# Patient Record
Sex: Female | Born: 1985 | Race: Black or African American | Hispanic: No | Marital: Married | Smoking: Never smoker
Health system: Southern US, Community
[De-identification: ages and names within clinical notes are randomized; demographics above are authoritative.]

## PROBLEM LIST (undated history)

## (undated) DIAGNOSIS — R87629 Unspecified abnormal cytological findings in specimens from vagina: Secondary | ICD-10-CM

## (undated) DIAGNOSIS — N809 Endometriosis, unspecified: Secondary | ICD-10-CM

## (undated) DIAGNOSIS — K219 Gastro-esophageal reflux disease without esophagitis: Secondary | ICD-10-CM

## (undated) DIAGNOSIS — O021 Missed abortion: Secondary | ICD-10-CM

## (undated) DIAGNOSIS — Z973 Presence of spectacles and contact lenses: Secondary | ICD-10-CM

## (undated) DIAGNOSIS — F419 Anxiety disorder, unspecified: Secondary | ICD-10-CM

## (undated) HISTORY — PX: WISDOM TOOTH EXTRACTION: SHX21

## (undated) HISTORY — PX: DILATION AND CURETTAGE OF UTERUS: SHX78

---

## 2005-07-21 ENCOUNTER — Emergency Department: Payer: Self-pay | Admitting: Emergency Medicine

## 2005-12-30 ENCOUNTER — Ambulatory Visit: Payer: Self-pay | Admitting: Internal Medicine

## 2006-02-17 ENCOUNTER — Ambulatory Visit: Payer: Self-pay | Admitting: Unknown Physician Specialty

## 2010-05-17 ENCOUNTER — Emergency Department: Payer: Self-pay | Admitting: Unknown Physician Specialty

## 2010-05-20 ENCOUNTER — Emergency Department: Payer: Self-pay | Admitting: Emergency Medicine

## 2013-07-20 HISTORY — PX: LEEP: SHX91

## 2015-06-03 ENCOUNTER — Other Ambulatory Visit: Payer: Self-pay | Admitting: Obstetrics and Gynecology

## 2015-06-04 ENCOUNTER — Encounter (HOSPITAL_COMMUNITY): Payer: Self-pay

## 2015-06-25 ENCOUNTER — Encounter (HOSPITAL_COMMUNITY): Payer: Self-pay | Admitting: Obstetrics and Gynecology

## 2015-06-25 ENCOUNTER — Other Ambulatory Visit (HOSPITAL_COMMUNITY): Payer: Self-pay | Admitting: Obstetrics and Gynecology

## 2015-06-25 NOTE — H&P (Signed)
Kathryn Perry is a 29 y.o. female G0 presents for  laparoscopic removal of an ovarian cyst and evaluation of pelvic pain because of a complex right ovarian cyst. and pelvic pain.  In June 2016 the patient stopped taking oral contraceptives in a effort to conceive and began to have heavy menstrual periods accompanied by severe cramping.  Her 5 day  menstrual flow required her to change a super tampon with a pad every 2-3 hours.  She rated her cramping as  8-10 on a 10 point pain scale with relief to 7/10 when she takes Tylenol  (unable to take NSAIDs due to allergy).  She goes on to report rectal  bleeding and pain with bowel movements,  lower back pain 3 out of 4 weeks each month,  but denies inter-menstrual bleeding, dyspareunia or change in urinary function.  A pelvic ultrasound in November 2016 showed:  uterus-5.33 x 3.42 x 3.61 cm;  right ovary-3.86 x 2.26 x 2.43 cm containing a septated cyst measuring-2.2 x 1.6 x 1.9 cm and a left ovary-2.10 x 1.00 x 1.61 cm.  Given the patient's presentation,  endometriosis was considered as a  possible cause of her symptoms.   Both medical and surgical options were reviewed with the patient regarding management and evaluation of her symptoms, however since pregnancy is desired the patient wishes to proceed with surgical evaluation and management of her pelvic pain and ovarian cyst.   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  Has a history of abnormal PAP smear that was treated in 2015 with LEEP;   Last PAP smear: 04/2015-normal  Medical History:  Anxiety, Migraine and Dysrhythmia (as a child)  Surgical History: 2015 Loop Electrosurgical Excision Procedure Denies problems with anesthesia or history of blood transfusions  Family History: Thyroid Disease and Diverticulosis   Social History:  Married and employed as a Pharmacist, hospital with [employer];   Denies tobacco use and occasionally uses alcohol   Medication:  Tylenol 500 mg as needed Allegra as needed Folic Acid daily  Allergies  Allergen Reactions  . Asa [Aspirin] Hives  . Nsaids Hives  Percocet causes severe nausea and vomiting  (unsure of Vicodin) Able to take Tylenol #3  (very small dose without nausea and vomiting)  Denies sensitivity to peanuts, shellfish, soy, latex or adhesives.  ROS: Admits to glasses, rectal bleeding and  menstrual migraines but  denies  vision changes, nasal congestion, dysphagia, tinnitus, dizziness, hoarseness, cough,  chest pain, shortness of breath, nausea, vomiting, diarrhea,constipation,  urinary frequency, urgency  dysuria, hematuria, vaginitis symptoms,  swelling of joints,easy bruising,  myalgias, arthralgias, skin rashes, unexplained weight loss and except as is mentioned in the history of present illness, patient's review of systems is otherwise negative.   Physical Exam  Bp: 112/72  P: 80  R: 20  Temperature: 98.5 degrees F orally   Weight: 146 lbs.  Height: 5' 1.5"  BMI: 27.1  Neck: supple without masses or thyromegaly Lungs: clear to auscultation Heart: regular rate and rhythm Abdomen: soft, diffusely tender, right  with guarding but not on the  left and no organomegaly Pelvic:EGBUS- wnl; vagina-normal rugae; uterus-normal size but tender,  cervix without lesions or motion tenderness; adnexae- right sided tenderness but no palpable masses masses Extremities:  no clubbing, cyanosis or edema   Assesment:  Complex Ovarian Cyst  Pelvic Pain             Menorrhagia                          Disposition:  Reviewed with the patient, the indication for her procedures along with tthe risks of surgery to include, but not limited to: reaction to anesthesia, damage to adjacent organs, infection and excessive bleeding. The patient verbalized understanding of these risks and has consented to proceed with Laparoscopic Ovarian  Cystectomy, Chromopertubation and Possible Removal of Endometriosis at [facility] on June 26, 2015 at 9:50 a.m.  CSN# [id number]   Tenea Sens J. Florene Glen, PA-C  for Dr. Seymour Bars. Haygood

## 2015-06-26 ENCOUNTER — Ambulatory Visit (HOSPITAL_COMMUNITY): Payer: BLUE CROSS/BLUE SHIELD | Admitting: Certified Registered Nurse Anesthetist

## 2015-06-26 ENCOUNTER — Encounter (HOSPITAL_COMMUNITY)
Admission: RE | Disposition: A | Discharge status: Home / Self Care | Payer: Self-pay | Source: Ambulatory Visit | Attending: Obstetrics and Gynecology

## 2015-06-26 ENCOUNTER — Ambulatory Visit (HOSPITAL_COMMUNITY)
Admission: RE | Admit: 2015-06-26 | Discharge: 2015-06-26 | Disposition: A | Discharge status: Home / Self Care | Payer: BLUE CROSS/BLUE SHIELD | Source: Ambulatory Visit | Attending: Obstetrics and Gynecology | Admitting: Obstetrics and Gynecology

## 2015-06-26 ENCOUNTER — Encounter (HOSPITAL_COMMUNITY): Payer: Self-pay | Admitting: Certified Registered Nurse Anesthetist

## 2015-06-26 DIAGNOSIS — N8311 Corpus luteum cyst of right ovary: Secondary | ICD-10-CM | POA: Diagnosis not present

## 2015-06-26 DIAGNOSIS — N946 Dysmenorrhea, unspecified: Secondary | ICD-10-CM | POA: Diagnosis not present

## 2015-06-26 DIAGNOSIS — N809 Endometriosis, unspecified: Secondary | ICD-10-CM | POA: Diagnosis present

## 2015-06-26 DIAGNOSIS — R198 Other specified symptoms and signs involving the digestive system and abdomen: Secondary | ICD-10-CM | POA: Diagnosis present

## 2015-06-26 DIAGNOSIS — N83209 Unspecified ovarian cyst, unspecified side: Secondary | ICD-10-CM | POA: Diagnosis not present

## 2015-06-26 DIAGNOSIS — R102 Pelvic and perineal pain: Secondary | ICD-10-CM | POA: Diagnosis not present

## 2015-06-26 DIAGNOSIS — N803 Endometriosis of pelvic peritoneum: Secondary | ICD-10-CM | POA: Diagnosis not present

## 2015-06-26 DIAGNOSIS — K625 Hemorrhage of anus and rectum: Secondary | ICD-10-CM | POA: Diagnosis present

## 2015-06-26 DIAGNOSIS — N920 Excessive and frequent menstruation with regular cycle: Secondary | ICD-10-CM | POA: Diagnosis not present

## 2015-06-26 DIAGNOSIS — N83201 Unspecified ovarian cyst, right side: Secondary | ICD-10-CM | POA: Diagnosis present

## 2015-06-26 HISTORY — PX: LAPAROSCOPY: SHX197

## 2015-06-26 HISTORY — PX: CHROMOPERTUBATION: SHX6288

## 2015-06-26 HISTORY — DX: Anxiety disorder, unspecified: F41.9

## 2015-06-26 HISTORY — PX: LAPAROSCOPIC OVARIAN CYSTECTOMY: SHX6248

## 2015-06-26 LAB — CBC
HCT: 35.3 % — ABNORMAL LOW (ref 36.0–46.0)
Hemoglobin: 11.9 g/dL — ABNORMAL LOW (ref 12.0–15.0)
MCH: 28.9 pg (ref 26.0–34.0)
MCHC: 33.7 g/dL (ref 30.0–36.0)
MCV: 85.7 fL (ref 78.0–100.0)
PLATELETS: 226 10*3/uL (ref 150–400)
RBC: 4.12 MIL/uL (ref 3.87–5.11)
RDW: 13.4 % (ref 11.5–15.5)
WBC: 7.1 10*3/uL (ref 4.0–10.5)

## 2015-06-26 LAB — PREGNANCY, URINE: Preg Test, Ur: NEGATIVE

## 2015-06-26 SURGERY — EXCISION, CYST, OVARY, LAPAROSCOPIC
Anesthesia: General | Site: Abdomen

## 2015-06-26 MED ORDER — ONDANSETRON HCL 4 MG/2ML IJ SOLN
INTRAMUSCULAR | Status: DC | PRN
  Administered 2015-06-26: 4 mg via INTRAVENOUS

## 2015-06-26 MED ORDER — FENTANYL CITRATE (PF) 250 MCG/5ML IJ SOLN
INTRAMUSCULAR | Status: AC
  Filled 2015-06-26: qty 5

## 2015-06-26 MED ORDER — HYDROCODONE-ACETAMINOPHEN 5-325 MG PO TABS
1.0000 | ORAL_TABLET | Freq: Once | ORAL | Status: AC
  Administered 2015-06-26: 1 via ORAL

## 2015-06-26 MED ORDER — ROCURONIUM BROMIDE 100 MG/10ML IV SOLN
INTRAVENOUS | Status: DC | PRN
  Administered 2015-06-26 (×2): 10 mg via INTRAVENOUS
  Administered 2015-06-26: 40 mg via INTRAVENOUS

## 2015-06-26 MED ORDER — PROPOFOL 10 MG/ML IV BOLUS
INTRAVENOUS | Status: DC | PRN
  Administered 2015-06-26: 200 mg via INTRAVENOUS

## 2015-06-26 MED ORDER — LIDOCAINE HCL (CARDIAC) 20 MG/ML IV SOLN
INTRAVENOUS | Status: DC | PRN
  Administered 2015-06-26: 50 mg via INTRAVENOUS

## 2015-06-26 MED ORDER — DEXAMETHASONE SODIUM PHOSPHATE 4 MG/ML IJ SOLN
INTRAMUSCULAR | Status: AC
  Filled 2015-06-26: qty 1

## 2015-06-26 MED ORDER — SCOPOLAMINE 1 MG/3DAYS TD PT72
1.0000 | MEDICATED_PATCH | Freq: Once | TRANSDERMAL | Status: DC
  Administered 2015-06-26: 1.5 mg via TRANSDERMAL

## 2015-06-26 MED ORDER — HYDROCODONE-ACETAMINOPHEN 5-325 MG PO TABS: 1.0000 | ORAL_TABLET | ORAL | Status: DC | PRN

## 2015-06-26 MED ORDER — ACETAMINOPHEN 10 MG/ML IV SOLN
1000.0000 mg | Freq: Once | INTRAVENOUS | Status: AC
  Administered 2015-06-26: 1000 mg via INTRAVENOUS
  Filled 2015-06-26: qty 100

## 2015-06-26 MED ORDER — HEPARIN SODIUM (PORCINE) 5000 UNIT/ML IJ SOLN
INTRAMUSCULAR | Status: AC
  Filled 2015-06-26: qty 1

## 2015-06-26 MED ORDER — METHYLENE BLUE 1 % INJ SOLN
INTRAMUSCULAR | Status: AC
  Filled 2015-06-26: qty 1

## 2015-06-26 MED ORDER — HYDROMORPHONE HCL 1 MG/ML IJ SOLN
INTRAMUSCULAR | Status: AC
  Filled 2015-06-26: qty 1

## 2015-06-26 MED ORDER — MIDAZOLAM HCL 2 MG/2ML IJ SOLN
INTRAMUSCULAR | Status: AC
  Filled 2015-06-26: qty 2

## 2015-06-26 MED ORDER — DEXAMETHASONE SODIUM PHOSPHATE 10 MG/ML IJ SOLN
INTRAMUSCULAR | Status: DC | PRN
  Administered 2015-06-26: 4 mg via INTRAVENOUS

## 2015-06-26 MED ORDER — METOCLOPRAMIDE HCL 5 MG/ML IJ SOLN
10.0000 mg | Freq: Once | INTRAMUSCULAR | Status: AC
  Administered 2015-06-26: 10 mg via INTRAVENOUS

## 2015-06-26 MED ORDER — ONDANSETRON HCL 4 MG/2ML IJ SOLN
INTRAMUSCULAR | Status: AC
  Filled 2015-06-26: qty 2

## 2015-06-26 MED ORDER — ACETAMINOPHEN 500 MG PO TABS: ORAL_TABLET | ORAL | Status: DC

## 2015-06-26 MED ORDER — PROPOFOL 10 MG/ML IV BOLUS
INTRAVENOUS | Status: AC
  Filled 2015-06-26: qty 20

## 2015-06-26 MED ORDER — GLYCOPYRROLATE 0.2 MG/ML IJ SOLN
INTRAMUSCULAR | Status: AC
Start: 2015-06-26 — End: 2015-06-26
  Filled 2015-06-26: qty 3

## 2015-06-26 MED ORDER — SCOPOLAMINE 1 MG/3DAYS TD PT72
MEDICATED_PATCH | TRANSDERMAL | Status: AC
  Administered 2015-06-26: 1.5 mg via TRANSDERMAL
  Filled 2015-06-26: qty 1

## 2015-06-26 MED ORDER — BUPIVACAINE HCL (PF) 0.25 % IJ SOLN
INTRAMUSCULAR | Status: DC | PRN
  Administered 2015-06-26: 30 mL

## 2015-06-26 MED ORDER — NEOSTIGMINE METHYLSULFATE 10 MG/10ML IV SOLN
INTRAVENOUS | Status: DC | PRN
  Administered 2015-06-26: 4 mg via INTRAVENOUS

## 2015-06-26 MED ORDER — BUPIVACAINE HCL (PF) 0.25 % IJ SOLN
INTRAMUSCULAR | Status: AC
  Filled 2015-06-26: qty 60

## 2015-06-26 MED ORDER — HYDROMORPHONE HCL 1 MG/ML IJ SOLN
INTRAMUSCULAR | Status: DC | PRN
  Administered 2015-06-26 (×2): 0.5 mg via INTRAVENOUS

## 2015-06-26 MED ORDER — ONDANSETRON HCL 4 MG PO TABS: 4.0000 mg | ORAL_TABLET | Freq: Three times a day (TID) | ORAL | Status: DC | PRN

## 2015-06-26 MED ORDER — HYDROMORPHONE HCL 1 MG/ML IJ SOLN
INTRAMUSCULAR | Status: AC
  Administered 2015-06-26: 0.5 mg via INTRAVENOUS
  Filled 2015-06-26: qty 1

## 2015-06-26 MED ORDER — LACTATED RINGERS IV SOLN
INTRAVENOUS | Status: DC
  Administered 2015-06-26 (×2): via INTRAVENOUS

## 2015-06-26 MED ORDER — MIDAZOLAM HCL 2 MG/2ML IJ SOLN
INTRAMUSCULAR | Status: DC | PRN
  Administered 2015-06-26: 2 mg via INTRAVENOUS

## 2015-06-26 MED ORDER — LIDOCAINE HCL (PF) 1 % IJ SOLN
INTRAMUSCULAR | Status: AC
  Filled 2015-06-26: qty 5

## 2015-06-26 MED ORDER — KETOROLAC TROMETHAMINE 30 MG/ML IJ SOLN
INTRAMUSCULAR | Status: AC
  Filled 2015-06-26: qty 1

## 2015-06-26 MED ORDER — LACTATED RINGERS IV SOLN
INTRAVENOUS | Status: DC | PRN
  Administered 2015-06-26: 1000 mL

## 2015-06-26 MED ORDER — HYDROMORPHONE HCL 1 MG/ML IJ SOLN
0.2500 mg | INTRAMUSCULAR | Status: DC | PRN
  Administered 2015-06-26 (×2): 0.5 mg via INTRAVENOUS

## 2015-06-26 MED ORDER — MEPERIDINE HCL 25 MG/ML IJ SOLN: 6.2500 mg | INTRAMUSCULAR | Status: DC | PRN

## 2015-06-26 MED ORDER — METOCLOPRAMIDE HCL 5 MG/ML IJ SOLN
INTRAMUSCULAR | Status: AC
  Administered 2015-06-26: 10 mg via INTRAVENOUS
  Filled 2015-06-26: qty 2

## 2015-06-26 MED ORDER — FENTANYL CITRATE (PF) 100 MCG/2ML IJ SOLN
INTRAMUSCULAR | Status: DC | PRN
  Administered 2015-06-26 (×2): 50 ug via INTRAVENOUS
  Administered 2015-06-26: 100 ug via INTRAVENOUS
  Administered 2015-06-26: 50 ug via INTRAVENOUS

## 2015-06-26 MED ORDER — LACTATED RINGERS IV SOLN: INTRAVENOUS | Status: DC

## 2015-06-26 MED ORDER — HYDROCODONE-ACETAMINOPHEN 5-325 MG PO TABS
ORAL_TABLET | ORAL | Status: AC
  Administered 2015-06-26: 1 via ORAL
  Filled 2015-06-26: qty 1

## 2015-06-26 MED ORDER — METHYLENE BLUE 1 % INJ SOLN
INTRAMUSCULAR | Status: DC | PRN
  Administered 2015-06-26: 90 mL via SUBMUCOSAL

## 2015-06-26 MED ORDER — GLYCOPYRROLATE 0.2 MG/ML IJ SOLN
INTRAMUSCULAR | Status: DC | PRN
  Administered 2015-06-26: 0.6 mg via INTRAVENOUS

## 2015-06-26 MED ORDER — PROMETHAZINE HCL 25 MG/ML IJ SOLN: 6.2500 mg | INTRAMUSCULAR | Status: DC | PRN

## 2015-06-26 SURGICAL SUPPLY — 38 items
CABLE HIGH FREQUENCY MONO STRZ (ELECTRODE) IMPLANT
CHLORAPREP W/TINT 26ML (MISCELLANEOUS) ×4 IMPLANT
CLOSURE WOUND 1/4 X3 (GAUZE/BANDAGES/DRESSINGS)
CLOTH BEACON ORANGE TIMEOUT ST (SAFETY) ×4 IMPLANT
CONTAINER PREFILL 10% NBF 60ML (FORM) IMPLANT
DILATOR CANAL MILEX (MISCELLANEOUS) ×4 IMPLANT
DRSG COVADERM PLUS 2X2 (GAUZE/BANDAGES/DRESSINGS) ×8 IMPLANT
DRSG OPSITE POSTOP 3X4 (GAUZE/BANDAGES/DRESSINGS) ×4 IMPLANT
DRSG TELFA 3X8 NADH (GAUZE/BANDAGES/DRESSINGS) ×4 IMPLANT
GAUZE VASELINE 3X9 (GAUZE/BANDAGES/DRESSINGS) ×4 IMPLANT
GLOVE BIOGEL PI IND STRL 7.0 (GLOVE) ×2 IMPLANT
GLOVE BIOGEL PI INDICATOR 7.0 (GLOVE) ×2
GLOVE SURG SS PI 6.5 STRL IVOR (GLOVE) ×8 IMPLANT
GOWN STRL REUS W/TWL LRG LVL3 (GOWN DISPOSABLE) ×12 IMPLANT
LIQUID BAND (GAUZE/BANDAGES/DRESSINGS) ×4 IMPLANT
MANIPULATOR UTERINE 4.5 ZUMI (MISCELLANEOUS) ×4 IMPLANT
NS IRRIG 1000ML POUR BTL (IV SOLUTION) ×4 IMPLANT
PACK LAPAROSCOPY BASIN (CUSTOM PROCEDURE TRAY) ×4 IMPLANT
PAD POSITIONING PINK XL (MISCELLANEOUS) ×4 IMPLANT
POUCH SPECIMEN RETRIEVAL 10MM (ENDOMECHANICALS) IMPLANT
SET IRRIG TUBING LAPAROSCOPIC (IRRIGATION / IRRIGATOR) IMPLANT
SHEARS HARMONIC ACE PLUS 36CM (ENDOMECHANICALS) IMPLANT
SLEEVE XCEL OPT CAN 5 100 (ENDOMECHANICALS) ×4 IMPLANT
STRIP CLOSURE SKIN 1/4X3 (GAUZE/BANDAGES/DRESSINGS) IMPLANT
SUT MNCRL AB 4-0 PS2 18 (SUTURE) IMPLANT
SUT VIC AB 0 CT1 27 (SUTURE) ×2
SUT VIC AB 0 CT1 27XBRD ANBCTR (SUTURE) ×2 IMPLANT
SUT VIC AB 3-0 PS2 18 (SUTURE)
SUT VIC AB 3-0 PS2 18XBRD (SUTURE) IMPLANT
SUT VICRYL 0 ENDOLOOP (SUTURE) IMPLANT
SUT VICRYL 0 UR6 27IN ABS (SUTURE) ×8 IMPLANT
SYR 50ML LL SCALE MARK (SYRINGE) ×4 IMPLANT
TOWEL OR 17X24 6PK STRL BLUE (TOWEL DISPOSABLE) ×8 IMPLANT
TRAY FOLEY CATH SILVER 14FR (SET/KITS/TRAYS/PACK) ×4 IMPLANT
TROCAR BALL TOP DISP 5MM (ENDOMECHANICALS) IMPLANT
TROCAR XCEL DIL TIP R 11M (ENDOMECHANICALS) IMPLANT
WARMER LAPAROSCOPE (MISCELLANEOUS) ×4 IMPLANT
WATER STERILE IRR 1000ML POUR (IV SOLUTION) ×4 IMPLANT

## 2015-06-26 NOTE — Discharge Instructions (Signed)
Call [facility] @ [phone] if:  You have a temperature greater than or equal to 100.4 degrees Farenheit orally You have pain that is not made better by the pain medication given and taken as directed You have excessive bleeding or problems urinating  Take Colace (Docusate Sodium/Stool Softener) 100 mg 2-3 times daily while taking narcotic pain medicine to avoid constipation or until bowel movements are regular. Take your Vicodin as directed, as needed for moderate pain and for mild pain you may take Tylenol every 6 hours If you have nausea because of your Vicodin,  you may take the Ondansetron 4 mg every 8 hours for nausea  You may drive after 24 hours You may shower tomorrow  You may resume a regular diet Keep incisions clean and dry You may resume intercourse in 48 hours  Keep follow up appointment with Dr. Leo Grosser on July 10, 2015 at 10:30 a.m. DISCHARGE INSTRUCTIONS: Laparoscopy  The following instructions have been prepared to help you care for yourself upon your return home today.  Wound care:  Do not get the incision wet for the first 24 hours. The incision should be kept clean and dry.  The Band-Aids or dressings may be removed the day after surgery.  Should the incision become sore, red, and swollen after the first week, check with your doctor.  Personal hygiene:  Shower the day after your procedure.  Activity and limitations:  Do NOT drive or operate any equipment today.  Do NOT lift anything more than 15 pounds for 2-3 weeks after surgery.  Do NOT rest in bed all day.  Walking is encouraged. Walk each day, starting slowly with 5-minute walks 3 or 4 times a day. Slowly increase the length of your walks.  Walk up and down stairs slowly.  Do NOT do strenuous activities, such as golfing, playing tennis, bowling, running, biking, weight lifting, gardening, mowing, or vacuuming for 2-4 weeks. Ask your doctor when it is okay to start.  Diet:  Eat a light meal as desired this evening. You may resume your usual diet tomorrow.  Return to work: This is dependent on the type of work you do. For the most part you can return to a desk job within a week of surgery. If you are more active at work, please discuss this with your doctor.  What to expect after your surgery: You may have a slight burning sensation when you urinate on the first day. You may have a very small amount of blood in the urine. Expect to have a small amount of vaginal discharge/light bleeding for 1-2 weeks. It is not unusual to have abdominal soreness and bruising for up to 2 weeks. You may be tired and need more rest for about 1 week. You may experience shoulder pain for 24-72 hours. Lying flat in bed may relieve it.  Call your doctor for any of the following:  Develop a fever of 100.4 or greater  Inability to urinate 6 hours after discharge from hospital  Severe pain not relieved by pain medications  Persistent of heavy bleeding at incision site  Redness or swelling around incision site after a week  Increasing nausea or vomiting  Patient Signature________________________________________ Nurse Signature_________________________________________

## 2015-06-26 NOTE — Anesthesia Postprocedure Evaluation (Signed)
Anesthesia Post Note  Patient: Kathryn Perry  Procedure(s) Performed: Procedure(s) (LRB): LAPAROSCOPIC OVARIAN CYSTECTOMY (N/A) LAPAROSCOPY OPERATIVE with excision of fEndometriosis and ablation of endometriosis (N/A) CHROMOPERTUBATION (N/A)  Patient location during evaluation: PACU Anesthesia Type: General Level of consciousness: awake and alert Pain management: pain level controlled Vital Signs Assessment: post-procedure vital signs reviewed and stable Respiratory status: spontaneous breathing, nonlabored ventilation, respiratory function stable and patient connected to nasal cannula oxygen Cardiovascular status: blood pressure returned to baseline and stable Postop Assessment: no signs of nausea or vomiting Anesthetic complications: no    Last Vitals:  Filed Vitals:   06/26/15 1345 06/26/15 1400  BP: 114/85 118/92  Pulse: 80 77  Temp:    Resp: 15 15    Last Pain:  Filed Vitals:   06/26/15 1404  PainSc: [facility] [name]

## 2015-06-26 NOTE — H&P (Signed)
History and Physical Interval Note:   06/26/2015   9:42 AM   Kathryn Perry  has presented today for surgery, with the diagnosis of Pelvic Pain, Right Ovarian Cyst; Dysmenorrhea, Endometriosis - 2 hours  The various methods of treatment have been discussed with the patient and family. After consideration of risks, benefits and other options for treatment, the patient has consented to  Procedure(s): LAPAROSCOPIC OVARIAN CYSTECTOMY LAPAROSCOPY OPERATIVE - Removal of Endometriosis CHROMOPERTUBATION as a surgical intervention .  I have reviewed the patients' chart and labs.  Questions were answered to the patient's satisfaction.     Eldred Manges  MD

## 2015-06-26 NOTE — Anesthesia Procedure Notes (Signed)
Procedure Name: Intubation Date/Time: 06/26/2015 10:01 AM Performed by: Bufford Spikes Pre-anesthesia Checklist: Patient identified, Patient being monitored, Emergency Drugs available, Timeout performed and Suction available Patient Re-evaluated:Patient Re-evaluated prior to inductionOxygen Delivery Method: Circle system utilized Preoxygenation: Pre-oxygenation with 100% oxygen Intubation Type: IV induction Ventilation: Mask ventilation without difficulty Laryngoscope Size: Miller and 2 Grade View: Grade II Tube type: Oral Tube size: 7.0 mm Number of attempts: 1 Airway Equipment and Method: Stylet Placement Confirmation: ETT inserted through vocal cords under direct vision,  breath sounds checked- equal and bilateral and positive ETCO2 Secured at: 21 cm Tube secured with: Tape Dental Injury: Teeth and Oropharynx as per pre-operative assessment

## 2015-06-26 NOTE — Transfer of Care (Signed)
Immediate Anesthesia Transfer of Care Note  Patient: Kathryn Perry  Procedure(s) Performed: Procedure(s): LAPAROSCOPIC OVARIAN CYSTECTOMY (N/A) LAPAROSCOPY OPERATIVE with excision of fEndometriosis and ablation of endometriosis (N/A) CHROMOPERTUBATION (N/A)  Patient Location: PACU  Anesthesia Type:General  Level of Consciousness: awake and sedated  Airway & Oxygen Therapy: Patient Spontanous Breathing and Patient connected to nasal cannula oxygen  Post-op Assessment: Report given to RN and Post -op Vital signs reviewed and stable  Post vital signs: Reviewed and stable  Last Vitals:  Filed Vitals:   06/26/15 0808  BP: 125/91  Pulse: 70  Temp: 36.6 C  Resp: 18    Complications: No apparent anesthesia complications

## 2015-06-26 NOTE — Anesthesia Preprocedure Evaluation (Addendum)
Anesthesia Evaluation  Patient identified by MRN, date of birth, ID band  Reviewed: Allergy & Precautions, NPO status , Patient's Chart, lab work & pertinent test results  Airway Mallampati: I  TM Distance: >3 FB Neck ROM: Full    Dental  (+) Teeth Intact   Pulmonary    breath sounds clear to auscultation       Cardiovascular negative cardio ROS  + dysrhythmias  Rhythm:Regular Rate:Normal     Neuro/Psych  Headaches, PSYCHIATRIC DISORDERS Anxiety    GI/Hepatic negative GI ROS, Neg liver ROS,   Endo/Other  negative endocrine ROS  Renal/GU negative Renal ROS  negative genitourinary   Musculoskeletal negative musculoskeletal ROS (+)   Abdominal   Peds negative pediatric ROS (+)  Hematology negative hematology ROS (+)   Anesthesia Other Findings   Reproductive/Obstetrics negative OB ROS                            Lab Results  Component Value Date   WBC 7.1 06/26/2015   HGB 11.9* 06/26/2015   HCT 35.3* 06/26/2015   MCV 85.7 06/26/2015   PLT 226 06/26/2015   No results found for: CREATININE, BUN, NA, K, CL, CO2 No results found for: INR, PROTIME   Anesthesia Physical Anesthesia Plan  ASA: II  Anesthesia Plan: General   Post-op Pain Management:    Induction: Intravenous  Airway Management Planned: Oral ETT  Additional Equipment:   Intra-op Plan:   Post-operative Plan: Extubation in OR  Informed Consent: I have reviewed the patients History and Physical, chart, labs and discussed the procedure including the risks, benefits and alternatives for the proposed anesthesia with the patient or authorized representative who has indicated his/her understanding and acceptance.   Dental advisory given  Plan Discussed with: CRNA  Anesthesia Plan Comments:         Anesthesia Quick Evaluation

## 2015-06-27 ENCOUNTER — Encounter (HOSPITAL_COMMUNITY): Payer: Self-pay | Admitting: Obstetrics and Gynecology

## 2015-06-28 NOTE — Op Note (Signed)
Operative Laparoscopy Procedure Note  Indications: The patient is a 29 y.o. female with pelvic pain and increasingly severe menorrhagia since discontinuing her birth control pills in June  Pre-operative Diagnosis: Pelvic pain, probable endometriosis. Desires conception  Post-operative Diagnosis: Pelvic pain, endometriosis. Bilateral tubal patency  Surgeon: Eldred Manges   Assistants: Earnstine Regal certified physician assistantGeneral endotracheal anesthesia  Anesthesia: General endotracheal anesthesia  ASA Class: 1  Procedure Details  The patient was seen in the Holding Room. The risks, benefits, complications, treatment options, and expected outcomes were discussed with the patient. The possibilities of reaction to medication, pulmonary aspiration, perforation of viscus, bleeding, recurrent infection, the need for additional procedures, failure to diagnose a condition, and creating a complication requiring transfusion or operation were discussed with the patient. The patient concurred with the proposed plan, giving informed consent. The patient was taken to the Operating Room, identified as Kathryn Perry and the procedure verified as Diagnostic Laparoscopy. A Time Out was held and the above information confirmed.  The patient had SCD hose in place during the entire procedure.  After induction of general anesthesia, the patient was placed in modified dorsal lithotomy position where she was prepped, draped, and catheterized in the normal, sterile fashion.  The cervix was visualized and an intrauterine ZUMI manipulator was placed.  The subumbilical region was injected with a 5 cc of quarter percent Marcaine. Similar injections were made in the suprapubic region to the right and left of midline for another total of 5 cc. A 1.5 cm umbilical incision was then performed. The subcutaneous tissue was dissected bluntly and the fascia grasped with Coker clamps and incised. Holding sutures of 0 Vicryl  were placed on either side of the fascial incision and the peritoneum was entered bluntly. The Hassan cannula was placed into the peritoneal cavity under direct visualization. A  pneumoperitoneum was established. The above findings were noted.  Suprapubic incisions were made to the right and left of midline and laparoscopic probe trochars placed through those incisions under direct visualization. The right posterior cul-de-sac lesion was excised using the J plasma dissector, and the specimen removed through the subumbilical port. The left pelvic sidewall lesion was ablated using the J plasma ablation function. Copious irrigation was carried out and he was stasis noted to be adequate.  When she cc of quarter percent Marcaine were left in the posterior cul-de-sac. Methylene blue was then placed through the intrauterine manipulator and catheter and both tubes were shown to spill the blue dye into the peritoneal cavity. At this point, the suprapubic trocar sleeves were removed from the peritoneal cavity under direct visualization. The Hassan cannula was then removed as the CO2 was allowed to escape. The fascia was closed with figure-of-eight sutures using the holding sutures of 0 Vicryl.. The incision was closed with subcutaneous and subcuticular sutures of 4-0 Vicryl. The suprapubic incisions were closed with Dermabond. The intrauterine manipulator was then removed.  Instrument, sponge, and needle counts were correct prior to abdominal closure and at the conclusion of the case.   Findings: The anterior cul-de-sac and round ligaments no lesions of endometriosis, though the left round ligament seemed shorter than the right, possibly from adhesions. The uterus was normal sized, mobile without specific lesions. The adnexa . The right tube was within normal limits. The right ovary contained a 2 cm corpus luteum cyst but no evidence of endometriosis. The left fallopian tube and ovary were within normal  limits. Cul-de-sac the left pelvic sidewall contained a classic powder burn lesion  overlying the area consistent with the left ureter. The right posterior cul-de-sac contained a white and stellate lesion of the peritoneum consistent with endometriosis.  Estimated Blood Loss:  Minimal         Drains: None                Specimens: Excisional biopsy of the right posterior cul-de-sac peritoneum              Complications:  None; patient tolerated the procedure well.         Disposition: PACU - hemodynamically stable.         Condition: stable

## 2016-03-24 ENCOUNTER — Encounter (HOSPITAL_COMMUNITY): Admission: RE | Payer: Self-pay | Source: Ambulatory Visit

## 2016-03-24 ENCOUNTER — Ambulatory Visit (HOSPITAL_COMMUNITY)
Admission: RE | Admit: 2016-03-24 | Payer: BLUE CROSS/BLUE SHIELD | Source: Ambulatory Visit | Admitting: Obstetrics and Gynecology

## 2016-03-24 SURGERY — DILATION AND CURETTAGE
Anesthesia: Choice

## 2016-03-25 ENCOUNTER — Other Ambulatory Visit: Payer: Self-pay | Admitting: Obstetrics and Gynecology

## 2016-03-25 ENCOUNTER — Encounter (HOSPITAL_BASED_OUTPATIENT_CLINIC_OR_DEPARTMENT_OTHER): Payer: Self-pay | Admitting: *Deleted

## 2016-03-25 DIAGNOSIS — O021 Missed abortion: Secondary | ICD-10-CM | POA: Diagnosis present

## 2016-03-25 NOTE — Progress Notes (Signed)
NPO AFTER MN WITH EXCEPTION CLEAR LIQUIDS UNTIL 0830 (NO CREAM [place] [employer]).  ARRIVE  AT 1230 (PT STATES OFFICE TOLD HER THAT THEY WE'RE ORDERING QUANTATIVE  HCG DOS, SHE IS CALLING OFFICE ABOUT THIS SINCE THERE IS NO LAB WORK ORDERED PER PRE-OP ORDERS).  NEEDS HG.  MAY TAKE TRAMADOL/ TYLENOL AM DOS W/ SIPS OF WATER IF NEEDED.

## 2016-03-25 NOTE — H&P (Signed)
Kathryn Perry is an 30 y.o. female. G1P0 who had a missed abortion documented by ultrasound in 02/2016.  After consideration of her options, she and her husband decided to observe only.  After 2 weeks of waiting to spontaneously pass tissue, they decided to proceed with cytotec therapy.  This was taken 8/19 without any results save very light bleeding for 2 days. The patient started on 03/26/16 to have bleeding similar to menses with clots. The patient has decided on D&E for completion of the missed abortion.  Pertinent Gynecological History: Menses: regular every 26 days without intermenstrual spotting, usually lasting less than 6 days and with severe dysmenorrhea Bleeding: menses only Contraception: none DES exposure: denies Blood transfusions: none Sexually transmitted diseases: no past history Previous GYN Procedures: laparoscopy and LEEP  Last mammogram: n/a Daten/a Last pap: normal Date: 2016 OB History: G1, P0   Menstrual History: Menarche age: 30 Patient's last menstrual period was 12/18/2015.    Past Medical History:  Diagnosis Date  . Anxiety   . Missed ab   . Wears glasses     Past Surgical History:  Procedure Laterality Date  . CHROMOPERTUBATION N/A 06/26/2015   Procedure: CHROMOPERTUBATION;  Surgeon: Eldred Manges, MD;  Location: [facility];  Service: Gynecology;  Laterality: N/A;  . LAPAROSCOPIC OVARIAN CYSTECTOMY N/A 06/26/2015   Procedure: LAPAROSCOPIC OVARIAN CYSTECTOMY;  Surgeon: Eldred Manges, MD;  Location: [facility];  Service: Gynecology;  Laterality: N/A;  . LAPAROSCOPY N/A 06/26/2015   Procedure: LAPAROSCOPY OPERATIVE with excision of fEndometriosis and ablation of endometriosis;  Surgeon: Eldred Manges, MD;  Location: [facility];  Service: Gynecology;  Laterality: N/A;  . LEEP  2015  . WISDOM TOOTH EXTRACTION      History reviewed. No pertinent family history.  Social History:  reports that she has never smoked. She has never used smokeless tobacco. She reports  that she drinks alcohol. She reports that she does not use drugs.  Allergies:  Allergies  Allergen Reactions  . Asa [Aspirin] Hives  . Nsaids Hives    Aleve (naproxen), Ibuprofen, ASA    Prescriptions Prior to Admission  Medication Sig Dispense Refill Last Dose  . acetaminophen (TYLENOL) 500 MG tablet Take 1,000 mg by mouth every 6 (six) hours as needed.   03/27/2016 at 1130  . folic acid (FOLVITE) A999333 MCG tablet Take 400 mcg by mouth daily.   03/26/2016 at Unknown time  . traMADol (ULTRAM) 50 MG tablet Take 50 mg by mouth every 6 (six) hours as needed.   Past Month at Unknown time    Review of Systems  Constitutional: Negative.   HENT: Negative.   Eyes: Negative.   Respiratory: Negative.   Cardiovascular: Negative.   Gastrointestinal: Negative.   Genitourinary: Negative.   Musculoskeletal: Negative.   Skin: Negative.     Blood pressure 122/75, pulse 80, temperature 98 F (36.7 C), temperature source Oral, resp. rate 16, height 5\' 2"  (1.575 m), weight 138 lb 8 oz (62.8 kg), last menstrual period 12/18/2015, SpO2 99 %. Physical Exam  Constitutional: She is oriented to person, place, and time. She appears well-developed and well-nourished.  HENT:  Head: Normocephalic and atraumatic.  Eyes: EOM are normal.  Neck: Normal range of motion. Neck supple.  Cardiovascular: Normal rate and regular rhythm.   Respiratory: Effort normal and breath sounds normal.  GI: Soft. Bowel sounds are normal.  Genitourinary:  Genitourinary Comments: Pelvic exam:  VULVA: normal appearing vulva with no masses, tenderness or lesions,  VAGINA: normal  appearing vagina with normal color and discharge, no lesions,  CERVIX: normal appearing cervix without discharge or lesions,  UTERUS: uterus is normal size, shape, consistency and nontender, enlarged to 10 week's size,  ADNEXA: normal adnexa in size, nontender and no masses.  Musculoskeletal: Normal range of motion.  Neurological: She is alert and oriented  to person, place, and time.  Skin: Skin is warm and dry.  Psychiatric: She has a normal mood and affect.    Results for orders placed or performed during the hospital encounter of 03/27/16 (from the past 24 hour(s))  Hemoglobin-hemacue, POC     Status: None   Collection Time: 03/27/16  1:29 PM  Result Value Ref Range   Hemoglobin 12.2 12.0 - 15.0 g/dL    No results found.  Assessment/Plan: Missed abortion Failed cytotec treatment  Plan Pt requests D&E for completion of missed AB.  The risks of anesthesia, bleeding, infection and damage to adjacent organs have been reviewed and the patient wishes to proceed.  Kathryn Perry P 03/27/2016, 2:14 PM

## 2016-03-26 ENCOUNTER — Other Ambulatory Visit: Payer: Self-pay | Admitting: Obstetrics and Gynecology

## 2016-03-27 ENCOUNTER — Ambulatory Visit (HOSPITAL_BASED_OUTPATIENT_CLINIC_OR_DEPARTMENT_OTHER)
Admission: RE | Admit: 2016-03-27 | Discharge: 2016-03-27 | Disposition: A | Discharge status: Home / Self Care | Payer: BLUE CROSS/BLUE SHIELD | Source: Ambulatory Visit | Attending: Obstetrics and Gynecology | Admitting: Obstetrics and Gynecology

## 2016-03-27 ENCOUNTER — Ambulatory Visit (HOSPITAL_BASED_OUTPATIENT_CLINIC_OR_DEPARTMENT_OTHER): Payer: BLUE CROSS/BLUE SHIELD | Admitting: Anesthesiology

## 2016-03-27 ENCOUNTER — Encounter (HOSPITAL_BASED_OUTPATIENT_CLINIC_OR_DEPARTMENT_OTHER): Payer: Self-pay | Admitting: *Deleted

## 2016-03-27 ENCOUNTER — Encounter (HOSPITAL_BASED_OUTPATIENT_CLINIC_OR_DEPARTMENT_OTHER)
Admission: RE | Disposition: A | Discharge status: Home / Self Care | Payer: Self-pay | Source: Ambulatory Visit | Attending: Obstetrics and Gynecology

## 2016-03-27 DIAGNOSIS — O021 Missed abortion: Secondary | ICD-10-CM | POA: Diagnosis present

## 2016-03-27 HISTORY — PX: DILATION AND EVACUATION: SHX1459

## 2016-03-27 HISTORY — DX: Presence of spectacles and contact lenses: Z97.3

## 2016-03-27 HISTORY — DX: Missed abortion: O02.1

## 2016-03-27 LAB — HEMOGLOBIN: Hemoglobin: 11.3 g/dL — ABNORMAL LOW (ref 12.0–15.0)

## 2016-03-27 LAB — HCG, QUANTITATIVE, PREGNANCY: HCG, BETA CHAIN, QUANT, S: 1922 m[IU]/mL — AB (ref <5)

## 2016-03-27 LAB — POCT HEMOGLOBIN-HEMACUE: Hemoglobin: 12.2 g/dL (ref 12.0–15.0)

## 2016-03-27 LAB — ABO/RH: ABO/RH(D): O POS

## 2016-03-27 SURGERY — DILATION AND EVACUATION, UTERUS
Anesthesia: General | Site: Uterus

## 2016-03-27 MED ORDER — PROPOFOL 10 MG/ML IV BOLUS
INTRAVENOUS | Status: DC | PRN
  Administered 2016-03-27: 50 mg via INTRAVENOUS
  Administered 2016-03-27: 200 mg via INTRAVENOUS

## 2016-03-27 MED ORDER — DOXYCYCLINE HYCLATE 100 MG PO CAPS: 100.0000 mg | ORAL_CAPSULE | Freq: Two times a day (BID) | ORAL | 0 refills | Status: AC

## 2016-03-27 MED ORDER — FENTANYL CITRATE (PF) 100 MCG/2ML IJ SOLN
INTRAMUSCULAR | Status: DC | PRN
  Administered 2016-03-27 (×2): 25 ug via INTRAVENOUS
  Administered 2016-03-27: 50 ug via INTRAVENOUS

## 2016-03-27 MED ORDER — MIDAZOLAM HCL 2 MG/2ML IJ SOLN
INTRAMUSCULAR | Status: AC
  Filled 2016-03-27: qty 2

## 2016-03-27 MED ORDER — ONDANSETRON HCL 4 MG/2ML IJ SOLN
4.0000 mg | Freq: Once | INTRAMUSCULAR | Status: DC | PRN
  Filled 2016-03-27: qty 2

## 2016-03-27 MED ORDER — OXYCODONE HCL 5 MG/5ML PO SOLN
5.0000 mg | Freq: Once | ORAL | Status: DC | PRN
  Filled 2016-03-27: qty 5

## 2016-03-27 MED ORDER — DEXAMETHASONE SODIUM PHOSPHATE 4 MG/ML IJ SOLN
INTRAMUSCULAR | Status: DC | PRN
  Administered 2016-03-27: 10 mg via INTRAVENOUS

## 2016-03-27 MED ORDER — MIDAZOLAM HCL 5 MG/5ML IJ SOLN
INTRAMUSCULAR | Status: DC | PRN
  Administered 2016-03-27: 2 mg via INTRAVENOUS

## 2016-03-27 MED ORDER — HYDROMORPHONE HCL 1 MG/ML IJ SOLN
0.2500 mg | INTRAMUSCULAR | Status: DC | PRN
  Administered 2016-03-27: 0.5 mg via INTRAVENOUS
  Filled 2016-03-27: qty 1

## 2016-03-27 MED ORDER — PROPOFOL 10 MG/ML IV BOLUS
INTRAVENOUS | Status: AC
  Filled 2016-03-27: qty 20

## 2016-03-27 MED ORDER — LACTATED RINGERS IV SOLN
INTRAVENOUS | Status: DC
  Administered 2016-03-27: 13:00:00 via INTRAVENOUS
  Filled 2016-03-27: qty 1000

## 2016-03-27 MED ORDER — METHYLERGONOVINE MALEATE 0.2 MG PO TABS: 0.2000 mg | ORAL_TABLET | Freq: Four times a day (QID) | ORAL | 0 refills | Status: AC

## 2016-03-27 MED ORDER — ONDANSETRON HCL 4 MG/2ML IJ SOLN
INTRAMUSCULAR | Status: AC
  Filled 2016-03-27: qty 2

## 2016-03-27 MED ORDER — HYDROMORPHONE HCL 1 MG/ML IJ SOLN
INTRAMUSCULAR | Status: AC
  Filled 2016-03-27: qty 1

## 2016-03-27 MED ORDER — METHYLERGONOVINE MALEATE 0.2 MG/ML IJ SOLN
0.2000 mg | Freq: Once | INTRAMUSCULAR | Status: AC
  Administered 2016-03-27: 0.2 mg via INTRAMUSCULAR
  Filled 2016-03-27: qty 1

## 2016-03-27 MED ORDER — LIDOCAINE 2% (20 MG/ML) 5 ML SYRINGE
INTRAMUSCULAR | Status: DC | PRN
  Administered 2016-03-27: 60 mg via INTRAVENOUS

## 2016-03-27 MED ORDER — DEXAMETHASONE SODIUM PHOSPHATE 10 MG/ML IJ SOLN
INTRAMUSCULAR | Status: AC
  Filled 2016-03-27: qty 1

## 2016-03-27 MED ORDER — OXYCODONE HCL 5 MG PO TABS
5.0000 mg | ORAL_TABLET | Freq: Once | ORAL | Status: DC | PRN
  Filled 2016-03-27: qty 1

## 2016-03-27 MED ORDER — MEPERIDINE HCL 25 MG/ML IJ SOLN
6.2500 mg | INTRAMUSCULAR | Status: DC | PRN
  Filled 2016-03-27: qty 1

## 2016-03-27 MED ORDER — LIDOCAINE HCL (PF) 2 % IJ SOLN
INTRAMUSCULAR | Status: DC | PRN
  Administered 2016-03-27: 10 mL

## 2016-03-27 MED ORDER — WATER FOR IRRIGATION, STERILE IR SOLN
Status: DC | PRN
  Administered 2016-03-27: 500 mL

## 2016-03-27 MED ORDER — TRAMADOL HCL 50 MG PO TABS: 50.0000 mg | ORAL_TABLET | Freq: Four times a day (QID) | ORAL | 0 refills | Status: DC | PRN

## 2016-03-27 MED ORDER — KETOROLAC TROMETHAMINE 30 MG/ML IJ SOLN
INTRAMUSCULAR | Status: AC
  Filled 2016-03-27: qty 1

## 2016-03-27 MED ORDER — ONDANSETRON HCL 4 MG/2ML IJ SOLN
INTRAMUSCULAR | Status: DC | PRN
  Administered 2016-03-27: 4 mg via INTRAVENOUS

## 2016-03-27 MED ORDER — FENTANYL CITRATE (PF) 100 MCG/2ML IJ SOLN
INTRAMUSCULAR | Status: AC
  Filled 2016-03-27: qty 2

## 2016-03-27 MED ORDER — LIDOCAINE 2% (20 MG/ML) 5 ML SYRINGE
INTRAMUSCULAR | Status: AC
  Filled 2016-03-27: qty 5

## 2016-03-27 SURGICAL SUPPLY — 30 items
CANISTER SUCTION 2500CC (MISCELLANEOUS) ×3 IMPLANT
CATH ROBINSON RED A/P 16FR (CATHETERS) ×3 IMPLANT
COVER BACK TABLE 60X90IN (DRAPES) ×3 IMPLANT
DRAPE LG THREE QUARTER DISP (DRAPES) ×3 IMPLANT
DRSG TELFA 3X8 NADH (GAUZE/BANDAGES/DRESSINGS) ×3 IMPLANT
ELECT REM PT RETURN 9FT ADLT (ELECTROSURGICAL)
ELECTRODE REM PT RTRN 9FT ADLT (ELECTROSURGICAL) IMPLANT
FILTER UTR ASPR ASSEMBLY (MISCELLANEOUS) ×3 IMPLANT
GLOVE BIOGEL PI IND STRL 7.0 (GLOVE) ×1 IMPLANT
GLOVE BIOGEL PI INDICATOR 7.0 (GLOVE) ×2
GLOVE SURG SS PI 6.5 STRL IVOR (GLOVE) ×6 IMPLANT
GOWN STRL REUS W/TWL LRG LVL3 (GOWN DISPOSABLE) ×6 IMPLANT
HOSE CONNECTING 18IN BERKELEY (TUBING) ×3 IMPLANT
KIT BERKELEY 1ST TRIMESTER 3/8 (MISCELLANEOUS) ×3 IMPLANT
KIT ROOM TURNOVER WOR (KITS) ×3 IMPLANT
LEGGING LITHOTOMY PAIR STRL (DRAPES) ×3 IMPLANT
NEEDLE SPNL 22GX3.5 QUINCKE BK (NEEDLE) IMPLANT
PACK BASIN DAY SURGERY FS (CUSTOM PROCEDURE TRAY) ×3 IMPLANT
SET BERKELEY SUCTION TUBING (SUCTIONS) ×3 IMPLANT
SYR CONTROL 10ML LL (SYRINGE) IMPLANT
TOWEL OR 17X24 6PK STRL BLUE (TOWEL DISPOSABLE) ×6 IMPLANT
TRAY DSU PREP LF (CUSTOM PROCEDURE TRAY) ×3 IMPLANT
TUBE CONNECTING 12'X1/4 (SUCTIONS)
TUBE CONNECTING 12X1/4 (SUCTIONS) IMPLANT
VACURETTE 10 RIGID CVD (CANNULA) ×3 IMPLANT
VACURETTE 6 ASPIR F TIP BERK (CANNULA) IMPLANT
VACURETTE 7MM CVD STRL WRAP (CANNULA) IMPLANT
VACURETTE 8 RIGID CVD (CANNULA) IMPLANT
VACURETTE 9 RIGID CVD (CANNULA) IMPLANT
WATER STERILE IRR 500ML POUR (IV SOLUTION) ×3 IMPLANT

## 2016-03-27 NOTE — Op Note (Signed)
03/27/2016  2:54 PM  PATIENT:  Kathryn Perry  30 y.o. female  PRE-OPERATIVE DIAGNOSIS:  Missed Abortion  POST-OPERATIVE DIAGNOSIS:  Missed Abortion  PROCEDURE:  Procedure(s): DILATATION AND EVACUATION  SURGEON:  Surgeon(s): Eldred Manges, MD  ASSISTANTS: none   ANESTHESIA:   local and general  ESTIMATED BLOOD LOSS: [id number]  COMPLICATIONS: none  FINDINGS:8 week sized uterus.  Cervix was open to accommodate a #10 suction catheter  BLOOD ADMINISTERED:none  LOCAL MEDICATIONS USED:  LIDOCAINE  and Amount: 10 ml  SPECIMEN:  Source of Specimen:  products of conception  DISPOSITION OF SPECIMEN:  PATHOLOGY  COUNTS:  YES  DESCRIPTION OF PROCEDURE:  The patient was taken the operating room after appropriate identification placed on the operating table. After the attainment of adequate anesthesia she was placed in the lithotomy position.Time out was done.   Perineum and vagina were prepped with multiple areas of Betadine and a straight in and out catheter used to empty the bladder. The perineum was draped as a sterile field. A weighted speculum was placed in the posterior vagina and a paracervical block achieved a total of 10 cc of 2% Xylocaine and the 5 and 7:00 positions. A single-tooth tenaculum was placed on the anterior cervix and the cervix noted to be  dilated to accommodate a number 10 suction catheter. The suction catheter was then used to suction evacuate all quadrants of the uterus. A sharp curet was used to ensure that all products of conception had been removed. All instruments were then removed from the vagina and the patient had her anesthetic reversed and was taken to the recovery room in satisfactory condition having tolerated the procedure well sponge and instrument counts correct.  PLAN OF CARE: Discharge home after post anesthesia care  PATIENT DISPOSITION:  PACU - hemodynamically stable.   Delay start of Pharmacological VTE agent (>24hrs) due to surgical blood loss or  risk of bleeding:  yes    Tayte Childers P, MD 2:54 PM

## 2016-03-27 NOTE — Anesthesia Preprocedure Evaluation (Signed)

## 2016-03-27 NOTE — Anesthesia Postprocedure Evaluation (Signed)
Anesthesia Post Note  Patient: Kathryn Perry  Procedure(s) Performed: Procedure(s) (LRB): DILATATION AND EVACUATION (N/A)  Patient location during evaluation: PACU Anesthesia Type: MAC Level of consciousness: awake Pain management: pain level controlled Vital Signs Assessment: post-procedure vital signs reviewed and stable Cardiovascular status: stable Postop Assessment: no signs of nausea or vomiting Anesthetic complications: no     Last Vitals:  Vitals:   03/27/16 1252  BP: 122/75  Pulse: 80  Resp: 16  Temp: 36.7 C    Last Pain:  Vitals:   03/27/16 1336  TempSrc:   PainSc: 4    Pain Goal: Patients Stated Pain Goal: 8 (03/27/16 1336)               [place]

## 2016-03-27 NOTE — Transfer of Care (Signed)
Immediate Anesthesia Transfer of Care Note  Patient: Kathryn Perry  Procedure(s) Performed: Procedure(s) (LRB): DILATATION AND EVACUATION (N/A)  Patient Location: PACU  Anesthesia Type: General  Level of Consciousness: awake, oriented, sedated and patient cooperative  Airway & Oxygen Therapy: Patient Spontanous Breathing and Patient connected to face mask oxygen  Post-op Assessment: Report given to PACU RN and Post -op Vital signs reviewed and stable  Post vital signs: Reviewed and stable  Complications: No apparent anesthesia complications

## 2016-03-27 NOTE — Anesthesia Procedure Notes (Signed)
Procedure Name: LMA Insertion Date/Time: 03/27/2016 2:25 PM Performed by: Denna Haggard D Pre-anesthesia Checklist: Patient identified, Emergency Drugs available, Suction available and Patient being monitored Patient Re-evaluated:Patient Re-evaluated prior to inductionOxygen Delivery Method: Circle system utilized Preoxygenation: Pre-oxygenation with 100% oxygen Intubation Type: IV induction Ventilation: Mask ventilation without difficulty LMA: LMA inserted LMA Size: 4.0 Number of attempts: 1 Airway Equipment and Method: Bite block Placement Confirmation: positive ETCO2 Tube secured with: Tape Dental Injury: Teeth and Oropharynx as per pre-operative assessment

## 2016-03-27 NOTE — Discharge Instructions (Signed)
Dilation and Curettage or Vacuum Curettage, Care After Refer to this sheet in the next few weeks. These instructions provide you with information on caring for yourself after your procedure. Your health care provider may also give you more specific instructions. Your treatment has been planned according to current medical practices, but problems sometimes occur. Call your health care provider if you have any problems or questions after your procedure. WHAT TO EXPECT AFTER THE PROCEDURE After your procedure, it is typical to have light cramping and bleeding. This may last for 2 days to 2 weeks after the procedure. HOME CARE INSTRUCTIONS   Do not drive for 24 hours.  Wait 1 week before returning to strenuous activities.  Take your temperature 2 times a day for 4 days and write it down. Provide these temperatures to your health care provider if you develop a fever.  Avoid long periods of standing.  Avoid heavy lifting, pushing, or pulling. Do not lift anything heavier than 10 pounds (4.5 kg).  Limit stair climbing to once or twice a day.  Take rest periods often.  You may resume your usual diet.  Drink enough fluids to keep your urine clear or pale yellow.  Your usual bowel function should return. If you have constipation, you may:  Take a mild laxative with permission from your health care provider.  Add fruit and bran to your diet.  Drink more fluids.  Take showers instead of baths until your health care provider gives you permission to take baths.  Do not go swimming or use a hot tub until your health care provider approves.  Try to have someone with you or available to you the first 24-48 hours, especially if you were given a general anesthetic.  Do not douche, use tampons, or have sex (intercourse) for 2 weeks after the procedure.  Only take over-the-counter or prescription medicines as directed by your health care provider. Do not take aspirin. It can cause  bleeding.  Follow up with your health care provider as directed. SEEK MEDICAL CARE IF:   You have increasing cramps or pain that is not relieved with medicine.  You have abdominal pain that does not seem to be related to the same area of earlier cramping and pain.  You have bad smelling vaginal discharge.  You have a rash.  You are having problems with any medicine. SEEK IMMEDIATE MEDICAL CARE IF:   You have bleeding that is heavier than a normal menstrual period.  You have a fever.  You have chest pain.  You have shortness of breath.  You feel dizzy or feel like fainting.  You pass out.  You have pain in your shoulder strap area.  You have heavy vaginal bleeding with or without blood clots. MAKE SURE YOU:   Understand these instructions.  Will watch your condition.  Will get help right away if you are not doing well or get worse.   This information is not intended to replace advice given to you by your health care provider. Make sure you discuss any questions you have with your health care provider.   Document Released: 07/03/2000 Document Revised: 07/11/2013 Document Reviewed: 02/02/2013 Elsevier Interactive Patient Education 2016 [place] [facility] Home Care Instructions  Activity: Get plenty of rest for the remainder of the day. A responsible adult should stay with you for 24 hours following the procedure.  For the next 24 hours, DO NOT: -Drive a car -Paediatric nurse -Drink alcoholic beverages -Take any medication unless  instructed by your physician -Make any legal decisions or sign important papers.  Meals: Start with liquid foods such as gelatin or soup. Progress to regular foods as tolerated. Avoid greasy, spicy, heavy foods. If nausea and/or vomiting occur, drink only clear liquids until the nausea and/or vomiting subsides. Call your physician if vomiting continues.  Special Instructions/Symptoms: Your throat may feel dry or sore from  the anesthesia or the breathing tube placed in your throat during surgery. If this causes discomfort, gargle with warm salt water. The discomfort should disappear within 24 hours.  If you had a scopolamine patch placed behind your ear for the management of post- operative nausea and/or vomiting:  1. The medication in the patch is effective for 72 hours, after which it should be removed.  Wrap patch in a tissue and discard in the trash. Wash hands thoroughly with soap and water. 2. You may remove the patch earlier than 72 hours if you experience unpleasant side effects which may include dry mouth, dizziness or visual disturbances. 3. Avoid touching the patch. Wash your hands with soap and water after contact with the patch.

## 2016-03-30 ENCOUNTER — Encounter (HOSPITAL_BASED_OUTPATIENT_CLINIC_OR_DEPARTMENT_OTHER): Payer: Self-pay | Admitting: Obstetrics and Gynecology

## 2016-10-12 ENCOUNTER — Ambulatory Visit: Payer: Self-pay | Admitting: Medical

## 2016-10-12 VITALS — BP 118/80 | HR 87 | Temp 98.8°F | Resp 16 | Ht 60.0 in | Wt 143.0 lb

## 2016-10-12 DIAGNOSIS — J208 Acute bronchitis due to other specified organisms: Secondary | ICD-10-CM

## 2016-10-12 DIAGNOSIS — J40 Bronchitis, not specified as acute or chronic: Secondary | ICD-10-CM

## 2016-10-12 MED ORDER — ALBUTEROL SULFATE 108 (90 BASE) MCG/ACT IN AEPB: 2.0000 | INHALATION_SPRAY | Freq: Four times a day (QID) | RESPIRATORY_TRACT | 0 refills | Status: DC | PRN

## 2016-10-12 MED ORDER — AZITHROMYCIN 250 MG PO TABS: ORAL_TABLET | ORAL | 0 refills | Status: DC

## 2016-10-12 NOTE — Patient Instructions (Signed)
Rest increase fluids,  otc tylenol take as directed.  Return or call clinic if coughing green or nasal discharge is green. Start antibiotics. Call if wheezing not resolved by albuterol.

## 2016-10-12 NOTE — Progress Notes (Signed)
   Subjective:    Patient ID: Kathryn Perry, female    DOB: [birth year], 31 y.o.   MRN: [id number]  HPI  31 yo female  With symotoms starting  Thursday with runny nose and sneezing , tried otc tylenol sinus, nose congestion better , now with sinus headache forehead , and  And clearing throat in room a lot. Chest tightness and pain on coughing. Cough nonproductive.    Review of Systems  Constitutional: Negative for chills and fever.  HENT: Positive for congestion, rhinorrhea, sinus pain, sinus pressure, sneezing and sore throat. Negative for ear pain.   Eyes: Negative.   Respiratory: Positive for cough and chest tightness. Negative for shortness of breath and wheezing.   Cardiovascular: Positive for chest pain. Negative for palpitations and leg swelling.  Gastrointestinal: Negative.   Endocrine: Negative.   Genitourinary: Negative.   Musculoskeletal: Positive for back pain.  Neurological: Negative.   Hematological: Negative.   Psychiatric/Behavioral: Negative.        Objective:   Physical Exam  Constitutional: She is oriented to person, place, and time. She appears well-developed and well-nourished.  HENT:  Head: Normocephalic and atraumatic.  Right Ear: Hearing normal. A middle ear effusion is present.  Left Ear: Hearing normal.  Mouth/Throat: Uvula is midline, oropharynx is clear and moist and mucous membranes are normal. Uvula swelling present. No oropharyngeal exudate, posterior oropharyngeal edema or posterior oropharyngeal erythema.  Eyes: Conjunctivae and EOM are normal. Pupils are equal, round, and reactive to light.  Neck: Normal range of motion. Neck supple.  Cardiovascular: Normal rate, regular rhythm and normal heart sounds.  Exam reveals no gallop and no friction rub.   No murmur heard. Pulmonary/Chest: Effort normal and breath sounds normal.  Musculoskeletal: Normal range of motion.  Lymphadenopathy:    She has no cervical adenopathy.  Neurological: She is alert and  oriented to person, place, and time.  Skin: Skin is warm and dry.  Nursing note and vitals reviewed.  Clearing throat a lot in room.      Assessment & Plan:  Viral bronchitis ,  Take Albuterol  MDI  2 pufsf every 6 hours as needed for shortness of breath, chest tightness or wheezing.Reviewed how to use an inhaler, patient had been on one before., but it had been a while. If productive green cough or nasal discharge start antibiotics. Try otc mucinex DM take as directed. Rest and increase fluids. Contact off if wheezing occurs. Return to the clinic if not improving or if any other concerns.

## 2017-02-26 LAB — OB RESULTS CONSOLE RUBELLA ANTIBODY, IGM: RUBELLA: IMMUNE

## 2017-02-26 LAB — OB RESULTS CONSOLE GC/CHLAMYDIA
CHLAMYDIA, DNA PROBE: NEGATIVE
GC PROBE AMP, GENITAL: NEGATIVE

## 2017-02-26 LAB — OB RESULTS CONSOLE HEPATITIS B SURFACE ANTIGEN: HEP B S AG: NEGATIVE

## 2017-02-26 LAB — OB RESULTS CONSOLE HIV ANTIBODY (ROUTINE TESTING): HIV: NONREACTIVE

## 2017-02-26 LAB — OB RESULTS CONSOLE ANTIBODY SCREEN: ANTIBODY SCREEN: NEGATIVE

## 2017-02-26 LAB — OB RESULTS CONSOLE ABO/RH: RH Type: POSITIVE

## 2017-02-26 LAB — OB RESULTS CONSOLE RPR: RPR: NONREACTIVE

## 2017-09-06 LAB — OB RESULTS CONSOLE GBS: GBS: NEGATIVE

## 2017-09-29 ENCOUNTER — Inpatient Hospital Stay (HOSPITAL_COMMUNITY)
Admission: AD | Admit: 2017-09-29 | Payer: BLUE CROSS/BLUE SHIELD | Source: Ambulatory Visit | Admitting: Obstetrics and Gynecology

## 2017-10-01 ENCOUNTER — Telehealth (HOSPITAL_COMMUNITY): Payer: Self-pay | Admitting: *Deleted

## 2017-10-01 ENCOUNTER — Encounter (HOSPITAL_COMMUNITY): Payer: Self-pay | Admitting: *Deleted

## 2017-10-01 NOTE — Telephone Encounter (Signed)
Preadmission screen  

## 2017-10-04 ENCOUNTER — Other Ambulatory Visit: Payer: Self-pay | Admitting: Obstetrics and Gynecology

## 2017-10-06 ENCOUNTER — Inpatient Hospital Stay (HOSPITAL_COMMUNITY): Payer: BLUE CROSS/BLUE SHIELD

## 2017-10-06 ENCOUNTER — Inpatient Hospital Stay (HOSPITAL_COMMUNITY): Payer: BLUE CROSS/BLUE SHIELD | Admitting: Anesthesiology

## 2017-10-06 ENCOUNTER — Inpatient Hospital Stay (HOSPITAL_COMMUNITY)
Admission: RE | Admit: 2017-10-06 | Discharge: 2017-10-10 | DRG: 787 | Disposition: A | Discharge status: Home / Self Care | Payer: BLUE CROSS/BLUE SHIELD | Source: Ambulatory Visit | Attending: Obstetrics & Gynecology | Admitting: Obstetrics & Gynecology

## 2017-10-06 ENCOUNTER — Encounter (HOSPITAL_COMMUNITY): Payer: Self-pay

## 2017-10-06 DIAGNOSIS — O480 Post-term pregnancy: Principal | ICD-10-CM | POA: Diagnosis present

## 2017-10-06 DIAGNOSIS — Z3493 Encounter for supervision of normal pregnancy, unspecified, third trimester: Secondary | ICD-10-CM

## 2017-10-06 DIAGNOSIS — Z3A41 41 weeks gestation of pregnancy: Secondary | ICD-10-CM

## 2017-10-06 DIAGNOSIS — O8612 Endometritis following delivery: Secondary | ICD-10-CM | POA: Diagnosis not present

## 2017-10-06 DIAGNOSIS — Z98891 History of uterine scar from previous surgery: Secondary | ICD-10-CM

## 2017-10-06 DIAGNOSIS — Z3490 Encounter for supervision of normal pregnancy, unspecified, unspecified trimester: Secondary | ICD-10-CM

## 2017-10-06 LAB — TYPE AND SCREEN
ABO/RH(D): O POS
Antibody Screen: NEGATIVE

## 2017-10-06 LAB — CBC
HCT: 30.1 % — ABNORMAL LOW (ref 36.0–46.0)
Hemoglobin: 10 g/dL — ABNORMAL LOW (ref 12.0–15.0)
MCH: 29.9 pg (ref 26.0–34.0)
MCHC: 33.2 g/dL (ref 30.0–36.0)
MCV: 90.1 fL (ref 78.0–100.0)
PLATELETS: 186 10*3/uL (ref 150–400)
RBC: 3.34 MIL/uL — ABNORMAL LOW (ref 3.87–5.11)
RDW: 14.4 % (ref 11.5–15.5)
WBC: 7 10*3/uL (ref 4.0–10.5)

## 2017-10-06 LAB — ABO/RH: ABO/RH(D): O POS

## 2017-10-06 LAB — RPR: RPR: NONREACTIVE

## 2017-10-06 MED ORDER — ONDANSETRON HCL 4 MG/2ML IJ SOLN
4.0000 mg | Freq: Four times a day (QID) | INTRAMUSCULAR | Status: DC | PRN
  Administered 2017-10-07: 4 mg via INTRAVENOUS
  Filled 2017-10-06: qty 2

## 2017-10-06 MED ORDER — EPHEDRINE 5 MG/ML INJ: 10.0000 mg | INTRAVENOUS | Status: DC | PRN

## 2017-10-06 MED ORDER — OXYTOCIN BOLUS FROM INFUSION: 500.0000 mL | Freq: Once | INTRAVENOUS | Status: DC

## 2017-10-06 MED ORDER — DIPHENHYDRAMINE HCL 50 MG/ML IJ SOLN: 12.5000 mg | INTRAMUSCULAR | Status: DC | PRN

## 2017-10-06 MED ORDER — FENTANYL 2.5 MCG/ML BUPIVACAINE 1/10 % EPIDURAL INFUSION (WH - ANES)
14.0000 mL/h | INTRAMUSCULAR | Status: DC | PRN
  Administered 2017-10-06 – 2017-10-07 (×3): 14 mL/h via EPIDURAL
  Filled 2017-10-06: qty 100

## 2017-10-06 MED ORDER — LIDOCAINE HCL (PF) 1 % IJ SOLN
30.0000 mL | INTRAMUSCULAR | Status: DC | PRN
  Filled 2017-10-06: qty 30

## 2017-10-06 MED ORDER — PHENYLEPHRINE 40 MCG/ML (10ML) SYRINGE FOR IV PUSH (FOR BLOOD PRESSURE SUPPORT): 80.0000 ug | PREFILLED_SYRINGE | INTRAVENOUS | Status: DC | PRN

## 2017-10-06 MED ORDER — LIDOCAINE HCL (PF) 1 % IJ SOLN
INTRAMUSCULAR | Status: DC | PRN
  Administered 2017-10-06 (×2): 5 mL via EPIDURAL

## 2017-10-06 MED ORDER — SOD CITRATE-CITRIC ACID 500-334 MG/5ML PO SOLN
30.0000 mL | ORAL | Status: DC | PRN
  Administered 2017-10-06 – 2017-10-07 (×2): 30 mL via ORAL
  Filled 2017-10-06 (×2): qty 15

## 2017-10-06 MED ORDER — OXYTOCIN 40 UNITS IN LACTATED RINGERS INFUSION - SIMPLE MED: 2.5000 [IU]/h | INTRAVENOUS | Status: DC

## 2017-10-06 MED ORDER — MISOPROSTOL 25 MCG QUARTER TABLET
25.0000 ug | ORAL_TABLET | ORAL | Status: DC | PRN
  Administered 2017-10-06: 25 ug via VAGINAL
  Filled 2017-10-06: qty 1

## 2017-10-06 MED ORDER — LACTATED RINGERS IV SOLN
500.0000 mL | Freq: Once | INTRAVENOUS | Status: AC
  Administered 2017-10-06: 500 mL via INTRAVENOUS

## 2017-10-06 MED ORDER — LACTATED RINGERS IV SOLN
500.0000 mL | INTRAVENOUS | Status: DC | PRN
  Administered 2017-10-07: 500 mL via INTRAVENOUS

## 2017-10-06 MED ORDER — ACETAMINOPHEN 325 MG PO TABS
650.0000 mg | ORAL_TABLET | ORAL | Status: DC | PRN
  Administered 2017-10-07: 650 mg via ORAL
  Filled 2017-10-06: qty 2

## 2017-10-06 MED ORDER — FENTANYL CITRATE (PF) 100 MCG/2ML IJ SOLN
100.0000 ug | INTRAMUSCULAR | Status: DC | PRN
  Administered 2017-10-06 (×2): 100 ug via INTRAVENOUS
  Filled 2017-10-06 (×2): qty 2

## 2017-10-06 MED ORDER — FENTANYL CITRATE (PF) 100 MCG/2ML IJ SOLN: 50.0000 ug | INTRAMUSCULAR | Status: DC | PRN

## 2017-10-06 MED ORDER — TERBUTALINE SULFATE 1 MG/ML IJ SOLN: 0.2500 mg | Freq: Once | INTRAMUSCULAR | Status: DC | PRN

## 2017-10-06 MED ORDER — FAMOTIDINE IN NACL 20-0.9 MG/50ML-% IV SOLN
20.0000 mg | INTRAVENOUS | Status: DC
  Administered 2017-10-06: 20 mg via INTRAVENOUS
  Filled 2017-10-06 (×2): qty 50

## 2017-10-06 MED ORDER — PHENYLEPHRINE 40 MCG/ML (10ML) SYRINGE FOR IV PUSH (FOR BLOOD PRESSURE SUPPORT)
PREFILLED_SYRINGE | INTRAVENOUS | Status: AC
  Filled 2017-10-06: qty 20

## 2017-10-06 MED ORDER — FLEET ENEMA 7-19 GM/118ML RE ENEM: 1.0000 | ENEMA | RECTAL | Status: DC | PRN

## 2017-10-06 MED ORDER — LACTATED RINGERS IV SOLN
INTRAVENOUS | Status: DC
  Administered 2017-10-06 – 2017-10-07 (×6): via INTRAVENOUS

## 2017-10-06 MED ORDER — OXYTOCIN 40 UNITS IN LACTATED RINGERS INFUSION - SIMPLE MED
1.0000 m[IU]/min | INTRAVENOUS | Status: DC
  Administered 2017-10-06: 1 m[IU]/min via INTRAVENOUS
  Filled 2017-10-06: qty 1000

## 2017-10-06 MED ORDER — FENTANYL 2.5 MCG/ML BUPIVACAINE 1/10 % EPIDURAL INFUSION (WH - ANES)
INTRAMUSCULAR | Status: AC
  Filled 2017-10-06: qty 100

## 2017-10-06 NOTE — Progress Notes (Addendum)
Patient ID: Kathryn Perry, female   DOB: 07/20/1986, 32 y.o.   MRN: [id number]  Updated by Irene Shipper, CNM, pt is 3/90/-2, SROM at 1530 with tracing fluctuating from cat 1 - cat 2.  Tracing reviewed.  Cat 2 currently.  Pitocin was discontinued about 77min ago.  I requested [facility] IUPC and FSE.

## 2017-10-06 NOTE — H&P (Addendum)
Kathryn Perry is a 32 y.o. female presenting for induction of labor due to post dates.  Pt denies any herpes type sxs.  Denies LOF or VB.  Pt rates contractions at 2/10.  Questions answered.  During prenatal care pt discussed condyloma on rt vulva and decision made to place bandage over it during labor and delivery.  OB History    Gravida Para Term Preterm AB Living   2       1     SAB TAB Ectopic Multiple Live Births   1             Past Medical History:  Diagnosis Date  . Anxiety   . Missed ab   . Wears glasses    Past Surgical History:  Procedure Laterality Date  . CHROMOPERTUBATION N/A 06/26/2015   Procedure: CHROMOPERTUBATION;  Surgeon: Eldred Manges, MD;  Location: [facility];  Service: Gynecology;  Laterality: N/A;  . DILATION AND CURETTAGE OF UTERUS    . DILATION AND EVACUATION N/A 03/27/2016   Procedure: DILATATION AND EVACUATION;  Surgeon: Eldred Manges, MD;  Location: [facility];  Service: Gynecology;  Laterality: N/A;  . LAPAROSCOPIC OVARIAN CYSTECTOMY N/A 06/26/2015   Procedure: LAPAROSCOPIC OVARIAN CYSTECTOMY;  Surgeon: Eldred Manges, MD;  Location: [facility];  Service: Gynecology;  Laterality: N/A;  . LAPAROSCOPY N/A 06/26/2015   Procedure: LAPAROSCOPY OPERATIVE with excision of fEndometriosis and ablation of endometriosis;  Surgeon: Eldred Manges, MD;  Location: [facility];  Service: Gynecology;  Laterality: N/A;  . LEEP  2015  . WISDOM TOOTH EXTRACTION     Family History: family history includes Colon cancer in her paternal grandfather. Social History:  reports that  has never smoked. she has never used smokeless tobacco. She reports that she drinks alcohol. She reports that she does not use drugs.     Maternal Diabetes: No Genetic Screening: Normal Maternal Ultrasounds/Referrals: Normal Fetal Ultrasounds or other Referrals:  None Maternal Substance Abuse:  No Significant Maternal Medications:  Meds include: Other:  Significant Maternal Lab  Results:  Lab values include: Group B Strep negative Other Comments:  h/o condyloma  ROS  Denies F/C/N/V/D  History   Blood pressure 126/72, pulse 97, temperature 98.3 F (36.8 C), temperature source Oral, resp. rate 16, height 5' (1.524 m), weight 183 lb (83 kg), last menstrual period 12/23/2016. Exam Physical Exam  Lungs CTA CV RRR ABD gravid, NT Ext no calf tenderness VE closed, soft, 50%, posterior, no herpetic lesions noted FHT 130, +accels, no decels, moderate variability Toco q2-57min  U/S - vtx  Prenatal labs: ABO, Rh: O/Positive/-- (08/10 0000) Antibody: Negative (08/10 0000) Rubella: Immune (08/10 0000) RPR: Nonreactive (08/10 0000)  HBsAg: Negative (08/10 0000)  HIV: Non-reactive (08/10 0000)  GBS: Negative (02/18 0000)   Assessment/Plan: P0 at 41wks presenting for induction of labor.  Pt is s/p cytotec x 1 dose (placed after 0930) and contracting regularly.  Will hold next dose of cytotec and start pitocin per protocol.  Good fetal well being with cat 1 tracing.  (Note started this am and updated now after exam and placement of bandage over condyloma).   Delice Lesch 10/06/2017, 8:21 AM

## 2017-10-06 NOTE — Progress Notes (Addendum)
Kathryn Perry is a 32 y.o. G2P0010 at [redacted]w[redacted]d   Subjective: No calls with pt complaints.  Tracing and vitals reviewed.  Objective: BP 131/69   Pulse 69   Temp 98.3 F (36.8 C) (Oral)   Resp 20   Ht 5' (1.524 m)   Wt 183 lb (83 kg)   LMP 12/23/2016   BMI 35.74 kg/m  No intake/output data recorded. No intake/output data recorded.  FHT:  FHR: 125 bpm, variability: moderate,  accelerations:  Present,  decelerations:  Absent UC:   regular, every 2 minutes SVE:   Dilation: Closed Station: -3 Exam by:: Ruben Im RNC  Labs: Lab Results  Component Value Date   WBC 7.0 10/06/2017   HGB 10.0 (L) 10/06/2017   HCT 30.1 (L) 10/06/2017   MCV 90.1 10/06/2017   PLT 186 10/06/2017    Assessment / Plan: Induction of labor d/t postdates on pitocin.  Labor: Cont to titrate pitocin per protocol Preeclampsia:  no signs or symptoms of toxicity Fetal Wellbeing:  Category I Pain Control:  upon request I/D:  GBS neg Anticipated MOD:  NSVD  Delice Lesch 10/06/2017, 4:49 PM

## 2017-10-06 NOTE — Anesthesia Preprocedure Evaluation (Signed)
Anesthesia Evaluation  Patient identified by MRN, date of birth, ID band Patient awake    Reviewed: Allergy & Precautions, H&P , NPO status , Patient's Chart, lab work & pertinent test results  Airway Mallampati: II   Neck ROM: full    Dental   Pulmonary neg pulmonary ROS,    breath sounds clear to auscultation       Cardiovascular negative cardio ROS   Rhythm:regular Rate:Normal     Neuro/Psych PSYCHIATRIC DISORDERS Anxiety    GI/Hepatic   Endo/Other  obese  Renal/GU      Musculoskeletal   Abdominal   Peds  Hematology   Anesthesia Other Findings   Reproductive/Obstetrics (+) Pregnancy                             Anesthesia Physical Anesthesia Plan  ASA: II  Anesthesia Plan: Epidural   Post-op Pain Management:    Induction: Intravenous  PONV Risk Score and Plan: 2 and Treatment may vary due to age or medical condition  Airway Management Planned: Natural Airway  Additional Equipment:   Intra-op Plan:   Post-operative Plan:   Informed Consent: I have reviewed the patients History and Physical, chart, labs and discussed the procedure including the risks, benefits and alternatives for the proposed anesthesia with the patient or authorized representative who has indicated his/her understanding and acceptance.     Plan Discussed with: Anesthesiologist  Anesthesia Plan Comments:         Anesthesia Quick Evaluation

## 2017-10-06 NOTE — Anesthesia Pain Management Evaluation Note (Signed)
  CRNA Pain Management Visit Note  Patient: Kathryn Perry, 32 y.o., female  "Hello I am a member of the anesthesia team at [facility]. We have an anesthesia team available at all times to provide care throughout the hospital, including epidural management and anesthesia for C-section. I don't know your plan for the delivery whether it a natural birth, water birth, IV sedation, nitrous supplementation, doula or epidural, but we want to meet your pain goals."   1.Was your pain managed to your expectations on prior hospitalizations?   No prior hospitalizations  2.What is your expectation for pain management during this hospitalization?     Epidural  3.How can we help you reach that goal? Be available  Record the patient's initial score and the patient's pain goal.   Pain: 0  Pain Goal: 6 [facility] wants you to be able to say your pain was always managed very well.  Everette Rank 10/06/2017

## 2017-10-06 NOTE — Anesthesia Procedure Notes (Signed)
Epidural Patient location during procedure: OB Start time: 10/06/2017 6:39 PM End time: 10/06/2017 6:48 PM  Staffing Anesthesiologist: Albertha Ghee, MD Performed: anesthesiologist   Preanesthetic Checklist Completed: patient identified, site marked, pre-op evaluation, timeout performed, IV checked, risks and benefits discussed and monitors and equipment checked  Epidural Patient position: sitting Prep: DuraPrep Patient monitoring: heart rate, cardiac monitor, continuous pulse ox and blood pressure Approach: midline Location: L2-L3 Injection technique: LOR saline  Needle:  Needle type: Tuohy  Needle gauge: 17 G Needle length: 9 cm Needle insertion depth: 7 cm Catheter type: closed end flexible Catheter size: 19 Gauge Catheter at skin depth: 12 cm Test dose: negative and Other  Assessment Events: blood not aspirated, injection not painful, no injection resistance and negative IV test  Additional Notes Informed consent obtained prior to proceeding including risk of failure, 1% risk of PDPH, risk of minor discomfort and bruising.  Discussed rare but serious complications including epidural abscess, permanent nerve injury, epidural hematoma.  Discussed alternatives to epidural analgesia and patient desires to proceed.  Timeout performed pre-procedure verifying patient name, procedure, and platelet count.  Patient tolerated procedure well. Reason for block:procedure for pain

## 2017-10-06 NOTE — Progress Notes (Signed)
Subjective: Pt comfortable with epidural.  Objective: BP 108/61   Pulse 88   Temp 98 F (36.7 C) (Oral)   Resp 16   Ht 5' (1.524 m)   Wt 83 kg (183 lb)   LMP 12/23/2016   SpO2 99%   BMI 35.74 kg/m  No intake/output data recorded. No intake/output data recorded.  FHT: Category 1 UC:   regular, every 3 minutes SVE:   Dilation: 3 Effacement (%): 90 Station: -2 Exam by:: L.Stubbs, RN Pitocin at 39mu   Assessment:  Postdates induction Cat 1 strip  Plan: Monitor progress  [name] CNM, MSN 10/06/2017, 8:47 PM

## 2017-10-07 ENCOUNTER — Encounter (HOSPITAL_COMMUNITY)
Admission: RE | Disposition: A | Discharge status: Home / Self Care | Payer: Self-pay | Source: Ambulatory Visit | Attending: Obstetrics & Gynecology

## 2017-10-07 ENCOUNTER — Encounter (HOSPITAL_COMMUNITY): Payer: Self-pay

## 2017-10-07 DIAGNOSIS — Z98891 History of uterine scar from previous surgery: Secondary | ICD-10-CM

## 2017-10-07 SURGERY — Surgical Case
Anesthesia: Epidural

## 2017-10-07 MED ORDER — DIPHENHYDRAMINE HCL 50 MG/ML IJ SOLN
12.5000 mg | INTRAMUSCULAR | Status: DC | PRN
Start: 2017-10-07 — End: 2017-10-07

## 2017-10-07 MED ORDER — LACTATED RINGERS IV SOLN: 500.0000 mL | Freq: Once | INTRAVENOUS | Status: DC

## 2017-10-07 MED ORDER — SIMETHICONE 80 MG PO CHEW: 80.0000 mg | CHEWABLE_TABLET | ORAL | Status: DC | PRN

## 2017-10-07 MED ORDER — COCONUT OIL OIL
1.0000 "application " | TOPICAL_OIL | Status: DC | PRN
  Administered 2017-10-08: 1 via TOPICAL
  Filled 2017-10-07: qty 120

## 2017-10-07 MED ORDER — ACETAMINOPHEN 325 MG PO TABS: 650.0000 mg | ORAL_TABLET | ORAL | Status: DC | PRN

## 2017-10-07 MED ORDER — DIPHENHYDRAMINE HCL 25 MG PO CAPS: 25.0000 mg | ORAL_CAPSULE | Freq: Four times a day (QID) | ORAL | Status: DC | PRN

## 2017-10-07 MED ORDER — CEFAZOLIN SODIUM-DEXTROSE 2-3 GM-%(50ML) IV SOLR
INTRAVENOUS | Status: DC | PRN
  Administered 2017-10-07: 2 g via INTRAVENOUS

## 2017-10-07 MED ORDER — NALBUPHINE HCL 10 MG/ML IJ SOLN: 5.0000 mg | Freq: Once | INTRAMUSCULAR | Status: DC | PRN

## 2017-10-07 MED ORDER — DIPHENHYDRAMINE HCL 50 MG/ML IJ SOLN: 12.5000 mg | INTRAMUSCULAR | Status: DC | PRN

## 2017-10-07 MED ORDER — SIMETHICONE 80 MG PO CHEW
80.0000 mg | CHEWABLE_TABLET | Freq: Three times a day (TID) | ORAL | Status: DC
  Administered 2017-10-07 – 2017-10-10 (×8): 80 mg via ORAL
  Filled 2017-10-07 (×9): qty 1

## 2017-10-07 MED ORDER — FENTANYL CITRATE (PF) 100 MCG/2ML IJ SOLN
INTRAMUSCULAR | Status: DC | PRN
  Administered 2017-10-07: 100 ug via INTRAVENOUS

## 2017-10-07 MED ORDER — LACTATED RINGERS IV SOLN
INTRAVENOUS | Status: DC | PRN
  Administered 2017-10-07: 09:00:00 via INTRAVENOUS

## 2017-10-07 MED ORDER — MORPHINE SULFATE (PF) 0.5 MG/ML IJ SOLN
INTRAMUSCULAR | Status: AC
  Filled 2017-10-07: qty 10

## 2017-10-07 MED ORDER — MEPERIDINE HCL 25 MG/ML IJ SOLN: 6.2500 mg | INTRAMUSCULAR | Status: DC | PRN

## 2017-10-07 MED ORDER — FENTANYL CITRATE (PF) 100 MCG/2ML IJ SOLN
INTRAMUSCULAR | Status: AC
  Filled 2017-10-07: qty 2

## 2017-10-07 MED ORDER — SCOPOLAMINE 1 MG/3DAYS TD PT72: 1.0000 | MEDICATED_PATCH | Freq: Once | TRANSDERMAL | Status: DC

## 2017-10-07 MED ORDER — PRENATAL MULTIVITAMIN CH
1.0000 | ORAL_TABLET | Freq: Every day | ORAL | Status: DC
  Administered 2017-10-09: 1 via ORAL
  Filled 2017-10-07 (×2): qty 1

## 2017-10-07 MED ORDER — WITCH HAZEL-GLYCERIN EX PADS: 1.0000 "application " | MEDICATED_PAD | CUTANEOUS | Status: DC | PRN

## 2017-10-07 MED ORDER — OXYTOCIN 10 UNIT/ML IJ SOLN
INTRAVENOUS | Status: DC | PRN
  Administered 2017-10-07: 40 [IU] via INTRAVENOUS

## 2017-10-07 MED ORDER — EPHEDRINE 5 MG/ML INJ
10.0000 mg | INTRAVENOUS | Status: DC | PRN
Start: 2017-10-07 — End: 2017-10-07

## 2017-10-07 MED ORDER — METHYLERGONOVINE MALEATE 0.2 MG PO TABS: 0.2000 mg | ORAL_TABLET | ORAL | Status: DC | PRN

## 2017-10-07 MED ORDER — OXYCODONE-ACETAMINOPHEN 5-325 MG PO TABS: 1.0000 | ORAL_TABLET | ORAL | Status: DC | PRN

## 2017-10-07 MED ORDER — PROMETHAZINE HCL 25 MG/ML IJ SOLN: 6.2500 mg | INTRAMUSCULAR | Status: DC | PRN

## 2017-10-07 MED ORDER — FENTANYL CITRATE (PF) 100 MCG/2ML IJ SOLN
25.0000 ug | INTRAMUSCULAR | Status: DC | PRN
  Administered 2017-10-07 (×2): 50 ug via INTRAVENOUS

## 2017-10-07 MED ORDER — ONDANSETRON HCL 4 MG/2ML IJ SOLN
INTRAMUSCULAR | Status: DC | PRN
  Administered 2017-10-07: 4 mg via INTRAVENOUS

## 2017-10-07 MED ORDER — SIMETHICONE 80 MG PO CHEW
80.0000 mg | CHEWABLE_TABLET | ORAL | Status: DC
  Administered 2017-10-09 (×2): 80 mg via ORAL
  Filled 2017-10-07 (×2): qty 1

## 2017-10-07 MED ORDER — LIDOCAINE-EPINEPHRINE (PF) 2 %-1:200000 IJ SOLN
INTRAMUSCULAR | Status: DC | PRN
  Administered 2017-10-07 (×2): 5 mL via EPIDURAL

## 2017-10-07 MED ORDER — MENTHOL 3 MG MT LOZG: 1.0000 | LOZENGE | OROMUCOSAL | Status: DC | PRN

## 2017-10-07 MED ORDER — SENNOSIDES-DOCUSATE SODIUM 8.6-50 MG PO TABS
2.0000 | ORAL_TABLET | ORAL | Status: DC
  Administered 2017-10-08 – 2017-10-09 (×3): 2 via ORAL
  Filled 2017-10-07 (×3): qty 2

## 2017-10-07 MED ORDER — BUPIVACAINE HCL 0.25 % IJ SOLN
INTRAMUSCULAR | Status: DC | PRN
  Administered 2017-10-07: 30 mL

## 2017-10-07 MED ORDER — ZOLPIDEM TARTRATE 5 MG PO TABS: 5.0000 mg | ORAL_TABLET | Freq: Every evening | ORAL | Status: DC | PRN

## 2017-10-07 MED ORDER — TETANUS-DIPHTH-ACELL PERTUSSIS 5-2.5-18.5 LF-MCG/0.5 IM SUSP: 0.5000 mL | Freq: Once | INTRAMUSCULAR | Status: DC

## 2017-10-07 MED ORDER — OXYTOCIN 40 UNITS IN LACTATED RINGERS INFUSION - SIMPLE MED: 2.5000 [IU]/h | INTRAVENOUS | Status: AC

## 2017-10-07 MED ORDER — NALOXONE HCL 4 MG/10ML IJ SOLN: 1.0000 ug/kg/h | INTRAVENOUS | Status: DC | PRN

## 2017-10-07 MED ORDER — DIPHENHYDRAMINE HCL 25 MG PO CAPS
25.0000 mg | ORAL_CAPSULE | ORAL | Status: DC | PRN
  Filled 2017-10-07: qty 1

## 2017-10-07 MED ORDER — MEPERIDINE HCL 25 MG/ML IJ SOLN
INTRAMUSCULAR | Status: DC | PRN
  Administered 2017-10-07 (×2): 12.5 mg via INTRAVENOUS

## 2017-10-07 MED ORDER — MORPHINE SULFATE (PF) 0.5 MG/ML IJ SOLN
INTRAMUSCULAR | Status: DC | PRN
  Administered 2017-10-07: 4 mg via EPIDURAL

## 2017-10-07 MED ORDER — SCOPOLAMINE 1 MG/3DAYS TD PT72
MEDICATED_PATCH | TRANSDERMAL | Status: DC | PRN
  Administered 2017-10-07: 1 via TRANSDERMAL

## 2017-10-07 MED ORDER — MEPERIDINE HCL 25 MG/ML IJ SOLN
INTRAMUSCULAR | Status: AC
Start: 2017-10-07 — End: ?
  Filled 2017-10-07: qty 1

## 2017-10-07 MED ORDER — PHENYLEPHRINE 40 MCG/ML (10ML) SYRINGE FOR IV PUSH (FOR BLOOD PRESSURE SUPPORT): 80.0000 ug | PREFILLED_SYRINGE | INTRAVENOUS | Status: DC | PRN

## 2017-10-07 MED ORDER — NALOXONE HCL 0.4 MG/ML IJ SOLN
0.4000 mg | INTRAMUSCULAR | Status: DC | PRN
Start: 2017-10-07 — End: 2017-10-10

## 2017-10-07 MED ORDER — BUPIVACAINE HCL (PF) 0.25 % IJ SOLN
INTRAMUSCULAR | Status: AC
  Filled 2017-10-07: qty 30

## 2017-10-07 MED ORDER — NALBUPHINE HCL 10 MG/ML IJ SOLN: 5.0000 mg | INTRAMUSCULAR | Status: DC | PRN

## 2017-10-07 MED ORDER — SCOPOLAMINE 1 MG/3DAYS TD PT72
MEDICATED_PATCH | TRANSDERMAL | Status: AC
  Filled 2017-10-07: qty 1

## 2017-10-07 MED ORDER — DEXAMETHASONE SODIUM PHOSPHATE 10 MG/ML IJ SOLN
INTRAMUSCULAR | Status: AC
  Filled 2017-10-07: qty 1

## 2017-10-07 MED ORDER — SODIUM CHLORIDE 0.9% FLUSH: 3.0000 mL | INTRAVENOUS | Status: DC | PRN

## 2017-10-07 MED ORDER — LACTATED RINGERS IV SOLN
INTRAVENOUS | Status: DC
  Administered 2017-10-07 – 2017-10-08 (×2): via INTRAVENOUS

## 2017-10-07 MED ORDER — ONDANSETRON HCL 4 MG/2ML IJ SOLN: 4.0000 mg | Freq: Three times a day (TID) | INTRAMUSCULAR | Status: DC | PRN

## 2017-10-07 MED ORDER — LACTATED RINGERS IV SOLN: INTRAVENOUS | Status: DC

## 2017-10-07 MED ORDER — DEXAMETHASONE SODIUM PHOSPHATE 10 MG/ML IJ SOLN
INTRAMUSCULAR | Status: DC | PRN
  Administered 2017-10-07: 10 mg via INTRAVENOUS

## 2017-10-07 MED ORDER — OXYCODONE-ACETAMINOPHEN 5-325 MG PO TABS: 2.0000 | ORAL_TABLET | ORAL | Status: DC | PRN

## 2017-10-07 MED ORDER — EPHEDRINE 5 MG/ML INJ: 10.0000 mg | INTRAVENOUS | Status: DC | PRN

## 2017-10-07 MED ORDER — CEFAZOLIN SODIUM-DEXTROSE 2-4 GM/100ML-% IV SOLN
INTRAVENOUS | Status: AC
  Filled 2017-10-07: qty 100

## 2017-10-07 MED ORDER — ACETAMINOPHEN-CODEINE #3 300-30 MG PO TABS
1.0000 | ORAL_TABLET | ORAL | Status: DC | PRN
  Administered 2017-10-07 – 2017-10-08 (×2): 1 via ORAL
  Administered 2017-10-08: 2 via ORAL
  Administered 2017-10-08 (×2): 1 via ORAL
  Administered 2017-10-09 – 2017-10-10 (×7): 2 via ORAL
  Filled 2017-10-07 (×3): qty 2
  Filled 2017-10-07: qty 1
  Filled 2017-10-07 (×3): qty 2
  Filled 2017-10-07 (×3): qty 1
  Filled 2017-10-07 (×2): qty 2

## 2017-10-07 MED ORDER — OXYTOCIN 10 UNIT/ML IJ SOLN
INTRAMUSCULAR | Status: AC
Start: 2017-10-07 — End: ?
  Filled 2017-10-07: qty 4

## 2017-10-07 MED ORDER — ONDANSETRON HCL 4 MG/2ML IJ SOLN
INTRAMUSCULAR | Status: AC
  Filled 2017-10-07: qty 2

## 2017-10-07 MED ORDER — DIBUCAINE 1 % RE OINT: 1.0000 "application " | TOPICAL_OINTMENT | RECTAL | Status: DC | PRN

## 2017-10-07 MED ORDER — METHYLERGONOVINE MALEATE 0.2 MG/ML IJ SOLN: 0.2000 mg | INTRAMUSCULAR | Status: DC | PRN

## 2017-10-07 SURGICAL SUPPLY — 38 items
BENZOIN TINCTURE PRP APPL 2/3 (GAUZE/BANDAGES/DRESSINGS) ×2 IMPLANT
CHLORAPREP W/TINT 26ML (MISCELLANEOUS) ×2 IMPLANT
CLAMP CORD UMBIL (MISCELLANEOUS) IMPLANT
CLOSURE STERI STRIP 1/2 X4 (GAUZE/BANDAGES/DRESSINGS) ×2 IMPLANT
CLOTH BEACON ORANGE TIMEOUT ST (SAFETY) ×2 IMPLANT
DRSG OPSITE 6X11 MED (GAUZE/BANDAGES/DRESSINGS) ×2 IMPLANT
DRSG OPSITE POSTOP 4X10 (GAUZE/BANDAGES/DRESSINGS) ×2 IMPLANT
ELECT REM PT RETURN 9FT ADLT (ELECTROSURGICAL) ×2
ELECTRODE REM PT RTRN 9FT ADLT (ELECTROSURGICAL) ×1 IMPLANT
EXTRACTOR VACUUM BELL STYLE (SUCTIONS) IMPLANT
EXTRACTOR VACUUM KIWI (MISCELLANEOUS) IMPLANT
GLOVE BIO SURGEON STRL SZ 6.5 (GLOVE) ×2 IMPLANT
GLOVE BIOGEL PI IND STRL 7.0 (GLOVE) ×2 IMPLANT
GLOVE BIOGEL PI INDICATOR 7.0 (GLOVE) ×2
GOWN STRL REUS W/TWL LRG LVL3 (GOWN DISPOSABLE) ×6 IMPLANT
KIT ABG SYR 3ML LUER SLIP (SYRINGE) IMPLANT
NEEDLE HYPO 22GX1.5 SAFETY (NEEDLE) IMPLANT
NEEDLE HYPO 25X5/8 SAFETYGLIDE (NEEDLE) IMPLANT
NS IRRIG 1000ML POUR BTL (IV SOLUTION) ×2 IMPLANT
PACK C SECTION WH (CUSTOM PROCEDURE TRAY) ×2 IMPLANT
PAD ABD 8X10 STRL (GAUZE/BANDAGES/DRESSINGS) ×2 IMPLANT
PAD OB MATERNITY 4.3X12.25 (PERSONAL CARE ITEMS) ×2 IMPLANT
PENCIL SMOKE EVAC W/HOLSTER (ELECTROSURGICAL) ×4 IMPLANT
RTRCTR C-SECT PINK 25CM LRG (MISCELLANEOUS) ×2 IMPLANT
STRIP CLOSURE SKIN 1/2X4 (GAUZE/BANDAGES/DRESSINGS) ×2 IMPLANT
SUT CHROMIC 2 0 CT 1 (SUTURE) ×4 IMPLANT
SUT MNCRL 0 VIOLET CTX 36 (SUTURE) ×2 IMPLANT
SUT MONOCRYL 0 CTX 36 (SUTURE) ×2
SUT PDS AB 0 CTX 36 PDP370T (SUTURE) IMPLANT
SUT PLAIN 2 0 (SUTURE)
SUT PLAIN ABS 2-0 CT1 27XMFL (SUTURE) IMPLANT
SUT VIC AB 0 CTX 36 (SUTURE) ×2
SUT VIC AB 0 CTX36XBRD ANBCTRL (SUTURE) ×2 IMPLANT
SUT VIC AB 4-0 KS 27 (SUTURE) ×2 IMPLANT
SYR CONTROL 10ML LL (SYRINGE) IMPLANT
TAPE MEDIFIX FOAM 3 (GAUZE/BANDAGES/DRESSINGS) ×2 IMPLANT
TOWEL OR 17X24 6PK STRL BLUE (TOWEL DISPOSABLE) ×2 IMPLANT
TRAY FOLEY BAG SILVER LF 14FR (SET/KITS/TRAYS/PACK) ×2 IMPLANT

## 2017-10-07 NOTE — Progress Notes (Signed)
Subjective: Pt comfortable  Called by RN because IUPC came out.    Objective: BP 118/75   Pulse 82   Temp 98.7 F (37.1 C) (Oral)   Resp 18   Ht 5' (1.524 m)   Wt 83 kg (183 lb)   LMP 12/23/2016   SpO2 99%   BMI 35.74 kg/m  No intake/output data recorded. Total I/O In: -  Out: 500 [Urine:500]  FHT: Category 1  FHT tracing 145 accels noted, occ variable decel UC:   3 in 10 minutes SVE:   5.5/90/-2 molding noted Pitocin at 8 mu IUPC placed without difficulty  Assessment:  IUP 1t 41 weeks induction for postdates slow progress Cat 1 strip Plan: Discussed with pt the head still high with some molding.  Discussed option of LTCS due to slow progress and molding of head. Tracing reactive now but with periods of decels.  Pt wishes to be reevaluated in a few hours.  [name] CNM, MSN 10/07/2017, 6:56 AM

## 2017-10-07 NOTE — Op Note (Signed)
Cesarean Section Procedure Note  Kathryn Perry  DOB:    05-05-1986  MRN:    [id number]  Date of Surgery:  10/07/2017  Indication: 32 yo G2P0010  41 week SIUP admitted for induction of labor for post dates with arrest of dilation at 7cm and Category II tracing.   Pre-operative Diagnosis: Arrest of Labor  Post-operative Diagnosis: same  Procedure:  Primary cesarean delivery                         Surgeon: Caffie Damme, MD  Assistants: None  Anesthesia: Epidural anesthesia  ASA Class: 2  Procedure Details   The patient was counseled about the risks, benefits, complications of the cesarean section. The patient concurred with the proposed plan, giving informed consent.  The site of surgery properly noted/marked. The patient was taken to Operating Room # 1, identified as Called patient and the procedure verified as C-Section Delivery. A Time Out was held and the above information confirmed.  After epidural was found to adequate , the patient was placed in the dorsal supine position with a leftward tilt, draped and prepped in the usual sterile manner. A Pfannenstiel incision was made with a 10 blade scalpel and the incision carried down through the subcutaneous tissue to the fascia.  The fascia was incised in the midline and the fascial incision was extended laterally with Mayo scissors. The superior aspect of the fascial incision was grasped with Coker clamps x2, tented up and the rectus muscles dissected off sharply with the bovie.  The rectus was then dissected off with blunt dissection and the bovie inferiorly. The rectus muscles were separated in the midline. The abdominal peritoneum was identified, and bluntly entered using surgeons fingers. The peritoneal opening was bluntly extended with gentle pulling.  The Alexis retractor was then deployed. The vesicouterine peritoneum was identified, tented up, entered sharply with Metzenbaum scissors, and the bladder flap was created digitally.  Scalpel was then used to make a low transverse incision on the uterus which was extended laterally with  blunt dissection. The fetal vertex was identified and delivered from cephalic presentation. Two tight nuchal cords were reduced. A healthy  Female with Apgar scores of 8 at one minute and 9 at five minutes. After the umbilical cord was clamped and cut after one minute the baby was handed off to the waiting Pediatricians. Cord blood was obtained for evaluation. The placenta was spontaneously removed intact. The placenta was handed off to be sent to L&D.   The uterus was cleared of all clot and debris. There was bleeding at the Left side of the incision and this was alleviated with #0 Monocryl at the angle.  The remaining uterine incision was closed in running locked fashion. A second imbricating suture was performed using the same suture. The incision was hemostatic. Ovaries and tubes were inspected and normal. The Alexis retractor was removed. The abdominal cavity was cleared of all clot and debris. The abdominal peritoneum was reapproximated with 2-0 chromic  in a running fashion, the rectus muscles was reapproximated with #2 chromic in interrupted fashion. The fascia was closed with 0 Vicryl in a running fashion. The subcuticular layer was irrigated and all bleeders cauterized.  30 mL of 0.25% Marcaine was injected into the subcutaneous layer.  The Scarpas fascia was re-approximated with interrupted sutures of 2-0 plain.   The skin was closed with 4-0 vicryl in a subcuticular fashion using a Lanny Hurst needle. The incision was dressed  with benzoine, steri strips and pressure dressing. All sponge lap and needle counts were correct x3.   Patient tolerated the procedure well and recovered in stable condition following the procedure.  Instrument, sponge, and needle counts were correct prior the abdominal closure and at the conclusion of the case.   Findings: Live female infant, Apgars 8/9, 2 tight nuchal  cords, clear amniotic fluid, placenta normal 3 vessels, normal uterus, bilateral tubes and ovaries  Estimated Blood Loss: 1249mL  IVF:  2500 mL LR         Drains: Foley catheter  Urine output: 200 mL clear         Specimens: Placenta to L&D         Implants: none         Complications:  None; patient tolerated the procedure well.         Disposition: PACU - hemodynamically stable.   Daryn Hicks [name]

## 2017-10-07 NOTE — Progress Notes (Addendum)
Patient ID: Kathryn Perry, female   DOB: [birth year], 32 y.o.   MRN: [id number]  Pt is 7/90/-2 this morning.  Tracing reviewed several times overnight.  Cat 1 and Cat 2 throughtout.  Could not get on remotely from 1-3am.  I spoke with Izora Gala at [phone] d/t protracted labor course having been 5cm for over 3hrs.  Izora Gala assesses the baby to be LGA as well and discussed c/s with pt d/t CPD but the pt declined.  When the pitocin is increased to get adequate contractions, the tracing becomes cat 2 so there is an element of fetal intolerance of labor as well.  C/S was revisited again at 0700 and pt would like to proceed with c/s.  I will let Dr. Alwyn Pea know at sign out.

## 2017-10-07 NOTE — Addendum Note (Signed)
Addendum  created 10/07/17 1715 by Flossie Dibble, CRNA   Sign clinical note

## 2017-10-07 NOTE — Lactation Note (Signed)
This note was copied from a baby's chart. Lactation Consultation Note   Patient Name: Kathryn Perry Today's Date: 10/07/2017 Reason for consult: Initial assessment  G1P1 mother whose infant is now 68 hours of age.  Infant sleeping in mother's arms and showing no signs of feeding cues.  Spoke with mother about attempting to feed infant 8-12 times in 24 hours and to unswaddle infant prior to latch.  Reviewed the importance of STS, breast shells and massage (with instruction).  Mom will call as needed.  Maternal Data Does the patient have breastfeeding experience prior to this delivery?: No  Feeding Feeding Type: Breast Fed Length of feed: 5 min(latch but too sleepy)  LATCH Score                   Interventions    Lactation Tools Discussed/Used     Consult Status Consult Status: Follow-up Date: 10/08/17 Follow-up type: In-patient    Kathryn Perry 10/07/2017, 4:31 PM

## 2017-10-07 NOTE — Progress Notes (Signed)
Patient ID: Kathryn Perry, female   DOB: 26-Oct-1985, 32 y.o.   MRN: [id number]  Was called by RN and patient reports hives with percocet, per patient she is able to take Tylenol 3. So will change this prescription.  Domnick Chervenak [name]

## 2017-10-07 NOTE — Progress Notes (Signed)
Patient ID: Kathryn Perry, female   DOB: 08-22-1985, 32 y.o.   MRN: [id number]   Preoperative Note:  Patient was seen at bedside and Cesarean section consent form signed, witnessed and placed into chart.  As previously documented by Dr. Mancel Bale:  Pt is 7/90/-2 this morning.  Tracing reviewed several times overnight.  Cat 1 and Cat 2 throughtout.  Could not get on remotely from 1-3am.  I spoke with Kathryn Perry at [phone] d/t protracted labor course having been 5cm for over 3hrs.  Kathryn Perry assesses the baby to be LGA as well and discussed c/s with pt d/t CPD but the pt declined.  When the pitocin is increased to get adequate contractions, the tracing becomes cat 2 so there is an element of fetal intolerance of labor as well.  C/S was revisited again at 0700 and pt would like to proceed with c/s.  I will let Dr. Alwyn Pea know at sign out.   On call to OR for primary c-section for labor dystocia and fetal intolerance of labor  Kathryn Perry

## 2017-10-07 NOTE — Anesthesia Postprocedure Evaluation (Signed)
Anesthesia Post Note  Patient: Kathryn Perry  Procedure(s) Performed: CESAREAN SECTION (N/A )     Patient location during evaluation: SICU Anesthesia Type: Epidural Level of consciousness: awake Pain management: pain level controlled Vital Signs Assessment: post-procedure vital signs reviewed and stable Respiratory status: patient remains intubated per anesthesia plan Cardiovascular status: stable Postop Assessment: no apparent nausea or vomiting, patient able to bend at knees, epidural receding and no backache Anesthetic complications: no    Last Vitals:  Vitals:   10/07/17 1115 10/07/17 1135  BP: (!) 118/109 119/66  Pulse: 91 75  Resp: 19 20  Temp:  37.2 C  SpO2: 94% 90%    Last Pain:  Vitals:   10/07/17 1135  TempSrc: Oral  PainSc: 1    Pain Goal: Patients Stated Pain Goal: 7 (10/06/17 1810)               Effie Berkshire

## 2017-10-07 NOTE — Progress Notes (Signed)
Subjective: Pt feeling pressure. In to reevaluate cervical change Objective: BP 118/75   Pulse 82   Temp 98.7 F (37.1 C) (Oral)   Resp 18   Ht 5' (1.524 m)   Wt 83 kg (183 lb)   LMP 12/23/2016   SpO2 99%   BMI 35.74 kg/m  I/O last 3 completed shifts: In: -  Out: 500 [Urine:500] No intake/output data recorded.  FHT: Category 2  FHT 135 occ varibles with contractions. UC:   regular, every 2-4 minutes SVE:   Dilation: 7 Effacement (%): 90 Station: -2 Exam by:: Irene Shipper, CNM Pitocin at 8 mu MVUs 130  Assessment:  IUP at 41 weeks induction slow progress Cat 2 strip  Plan: Diuscussed with pt and husband head still high, contractions not adequate but with increase in pitocin strip becomes cat 2.  Offered LTCS with risk, reviewed.  Pt and husband to talk. Dr. Mancel Bale updated.  [name] CNM, MSN 10/07/2017, 7:01 AM

## 2017-10-07 NOTE — Progress Notes (Signed)
Subjective: Pt comfortable.  Feeling light pressure  Objective: BP 126/73   Pulse 92   Temp 99.4 F (37.4 C) (Oral)   Resp 16   Ht 5' (1.524 m)   Wt 83 kg (183 lb)   LMP 12/23/2016   SpO2 99%   BMI 35.74 kg/m  No intake/output data recorded. No intake/output data recorded.  FHT: Category 1 UC:   regular, every 3-4 minutes SVE:   Dilation: 5 Effacement (%): 90 Station: -2 Exam by:: L.Stubbs, RN Pitocin at off MVUs   Assessment:  IUP at 41 weeks induction for postdates Cat 1 strip Plan: Restart pitocin for adequate contractions.  [name] CNM, MSN 10/07/2017, 12:00 AM

## 2017-10-07 NOTE — Anesthesia Postprocedure Evaluation (Signed)
Anesthesia Post Note  Patient: Kathryn Perry  Procedure(s) Performed: CESAREAN SECTION (N/A )     Patient location during evaluation: Mother Baby Anesthesia Type: Epidural Level of consciousness: awake and alert and oriented Pain management: satisfactory to patient Vital Signs Assessment: post-procedure vital signs reviewed and stable Respiratory status: respiratory function stable and spontaneous breathing Cardiovascular status: blood pressure returned to baseline Postop Assessment: no headache, no backache, spinal receding, patient able to bend at knees and adequate PO intake Anesthetic complications: no    Last Vitals:  Vitals:   10/07/17 1413 10/07/17 1442  BP:  121/71  Pulse:  60  Resp:  16  Temp:  36.7 C  SpO2: 96% 94%    Last Pain:  Vitals:   10/07/17 1442  TempSrc: Oral  PainSc:    Pain Goal: Patients Stated Pain Goal: 7 (10/06/17 1810)               [name]

## 2017-10-07 NOTE — Brief Op Note (Signed)
10/07/2017  10:20 AM  PATIENT:  Kathryn Perry  32 y.o. female  PRE-OPERATIVE DIAGNOSIS:  Arrest of Labor   POST-OPERATIVE DIAGNOSIS:  Arrest of Labor   PROCEDURE:  Procedure(s): CESAREAN SECTION (N/A)  SURGEON:  Surgeon(s) and Role:    Sanjuana Kava, MD - Primary   ASSISTANTS: none   ANESTHESIA:   local and epidural  EBL:  1250 mL   BLOOD ADMINISTERED:none  DRAINS: Urinary Catheter (Foley)   LOCAL MEDICATIONS USED:  MARCAINE    and Amount: 30 ml  SPECIMEN:  No Specimen  DISPOSITION OF SPECIMEN:  N/A  COUNTS:  YES  TOURNIQUET:  * No tourniquets in log *  DICTATION: .Note written in EPIC  PLAN OF CARE: Admit to inpatient   PATIENT DISPOSITION:  PACU - hemodynamically stable.   Delay start of Pharmacological VTE agent (>24hrs) due to surgical blood loss or risk of bleeding: not applicable  Kathryn Perry Kathryn Perry

## 2017-10-07 NOTE — Transfer of Care (Signed)
Immediate Anesthesia Transfer of Care Note  Patient: Kathryn Perry  Procedure(s) Performed: CESAREAN SECTION (N/A )  Patient Location: PACU  Anesthesia Type:Epidural  Level of Consciousness: awake, alert  and oriented  Airway & Oxygen Therapy: Patient Spontanous Breathing  Post-op Assessment: Report given to RN and Post -op Vital signs reviewed and stable  Post vital signs: Reviewed and stable  Last Vitals:  Vitals Value Taken Time  BP 131/72 10/07/2017 10:25 AM  Temp    Pulse 87 10/07/2017 10:27 AM  Resp 19 10/07/2017 10:27 AM  SpO2 95 % 10/07/2017 10:27 AM  Vitals shown include unvalidated device data.  Last Pain:  Vitals:   10/07/17 0829  TempSrc:   PainSc: 0-No pain      Patients Stated Pain Goal: 7 (85/92/92 4462)  Complications: No apparent anesthesia complications

## 2017-10-07 NOTE — Progress Notes (Signed)
Subjective: Pt comfortable  Objective: BP 126/73   Pulse 92   Temp 99.4 F (37.4 C) (Oral)   Resp 16   Ht 5' (1.524 m)   Wt 83 kg (183 lb)   LMP 12/23/2016   SpO2 99%   BMI 35.74 kg/m  No intake/output data recorded. No intake/output data recorded.  FHT: Category 2  FHT bl 150 with variable decels with contractions. UC:   regular, every 3 minutes SVE:   Dilation: 5 Effacement (%): 90 Station: -2 Exam by:: L.Stubbs, RN Pitocin turned off. IUPC and scalp electrode explained to patient.  Placed without difficulty.  Assessment:  IUP at 41 weeks induction for postdates Cat 2  Plan: Monitor strip Restart pitocin when cat 1 for adequate contractions.  [name] CNM, MSN 10/07/2017, 12:03 AM

## 2017-10-08 ENCOUNTER — Other Ambulatory Visit: Payer: Self-pay

## 2017-10-08 LAB — CBC
HCT: 23.2 % — ABNORMAL LOW (ref 36.0–46.0)
HEMOGLOBIN: 7.9 g/dL — AB (ref 12.0–15.0)
MCH: 30.5 pg (ref 26.0–34.0)
MCHC: 34.1 g/dL (ref 30.0–36.0)
MCV: 89.6 fL (ref 78.0–100.0)
Platelets: 176 10*3/uL (ref 150–400)
RBC: 2.59 MIL/uL — AB (ref 3.87–5.11)
RDW: 14.5 % (ref 11.5–15.5)
WBC: 16.5 10*3/uL — ABNORMAL HIGH (ref 4.0–10.5)

## 2017-10-08 LAB — BIRTH TISSUE RECOVERY COLLECTION (PLACENTA DONATION)

## 2017-10-08 MED ORDER — DEXTROMETHORPHAN POLISTIREX ER 30 MG/5ML PO SUER
10.0000 mL | Freq: Two times a day (BID) | ORAL | Status: DC | PRN
  Administered 2017-10-08 – 2017-10-09 (×3): 60 mg via ORAL
  Filled 2017-10-08 (×5): qty 10

## 2017-10-08 MED ORDER — SODIUM CHLORIDE 0.9 % IV SOLN
3.0000 g | Freq: Four times a day (QID) | INTRAVENOUS | Status: DC
  Administered 2017-10-08 – 2017-10-09 (×5): 3 g via INTRAVENOUS
  Filled 2017-10-08 (×7): qty 3

## 2017-10-08 MED ORDER — FAMOTIDINE 20 MG PO TABS
20.0000 mg | ORAL_TABLET | Freq: Every day | ORAL | Status: DC
  Filled 2017-10-08 (×2): qty 1

## 2017-10-08 NOTE — Progress Notes (Signed)
CSW received consult for hx of Anxiety.  CSW met with MOB to offer support and complete assessment.    When CSW arrived, MOB was bonding with infant as evidence by engaging in breastfeeding and MOB's parents were observing.   MOB appeared relaxed and comfortable. CSW explained CSW's role and MOB gave CSW permission to complete the assessment while MOB's parents were present. MOB was polite, easy to engage, and receptive to meeting with CSW.  MOB's parents appeared supportive and was extremely happy about being grandparents.  CSW asked about MOB's hx of anxiety and MOB denied a hx.  Per MOB, MOB was prescribed anxiety medication about 5 years ago when MOB was having extreme pain during sexually intercourse. MOB reported that MOB has no additional symptoms and have been off the medication for about 3 years.   CSW utilized the opportunity to provided education regarding the baby blues period vs. perinatal mood disorders, discussed treatment and gave resources for mental health follow up if concerns arise.    CSW provided review of Sudden Infant Death Syndrome (SIDS) precautions.    CSW identifies no further need for intervention and no barriers to discharge at this time.  Sherle Mello Boyd-Gilyard, MSW, LCSW Clinical Social Work [phone]  

## 2017-10-08 NOTE — Progress Notes (Signed)
L. Clemmons,CNM notified of pt's axillary  temp of 101.2.  Orders received

## 2017-10-08 NOTE — Progress Notes (Signed)
Subjective: Postpartum Day 1: Cesarean Delivery Patient reports has not been up to br  Objective: Vital signs in last 24 hours: Temp:  [97.7 F (36.5 C)-99.9 F (37.7 C)] 97.8 F (36.6 C) (03/22 0543) Pulse Rate:  [60-112] 65 (03/22 0543) Resp:  [16-22] 18 (03/22 0543) BP: (108-134)/(58-109) 108/66 (03/22 0543) SpO2:  [86 %-99 %] 99 % (03/22 0543)  Physical Exam:  General: alert, cooperative and appears stated age Lochia: appropriate Uterine Fundus: firm Incision: pressure dressing in place DVT Evaluation: No evidence of DVT seen on physical exam. No cords or calf tenderness.  Recent Labs    10/06/17 0810 10/08/17 0529  HGB 10.0* 7.9*  HCT 30.1* 23.2*    Assessment/Plan: Status post Cesarean section. Postoperative course complicated by blood loss  Continue current care.  Lori A Clemmons CNM 10/08/2017, 6:57 AM

## 2017-10-08 NOTE — Progress Notes (Signed)
When this RN and Student Nurse assessed patient, we both noted that patient is holding breath when moving and when we pushed on her belly. Abdomin is hard and difficult to palpate.

## 2017-10-08 NOTE — Progress Notes (Signed)
Patient's abdomin reassessed by this RN and Student Nurse.  Patient not holding breath as much and abdomin is softer when abdomin palpated after shower. Patient is aware (and agreed with this RN) that palpation of abdomin was more easily tolerated. Patient continues to pass gas.

## 2017-10-08 NOTE — Lactation Note (Signed)
This note was copied from a baby's chart. Lactation Consultation Note  Patient Name: Kathryn Perry [id number] Date: 10/08/2017   Sharyn Lull, RN inquired about breastfeeding compatibility of Delysm. Regular Delsym has dextromethorphan, which although an L3, Thomas Hale's "Medications & Mother's Milk" 2019 states that "it is very unlikely that enough would transfer via milk to to provide clinically significant levels in a breastfed infant" and "this cough suppressant could be considered" since it does not contain codeine and benzonatate.  RN to ask MD for permission to provide Delsym to mother.    Matthias Hughs [facility] 10/08/2017, 6:44 PM

## 2017-10-08 NOTE — Progress Notes (Signed)
The patient is receiving pepcid by the intravenous route.  Based on criteria approved by the Pharmacy and [place], the medication is being converted to the equivalent oral dose form.  These criteria include: -No Active GI bleeding -Able to tolerate diet of full liquids (or better) or tube feeding -Able to tolerate other medications by the oral or enteral route  If you have any questions about this conversion, please contact the Pharmacy Department (ext [phone]).  Thank you. Kathryn Perry 10/08/2017

## 2017-10-08 NOTE — Progress Notes (Signed)
Patient showered this am and removed pressure dressing.  The honeycomb dressing under the pressure dressing is wet, not sealed and has drainage which was marked.

## 2017-10-09 LAB — CBC WITH DIFFERENTIAL/PLATELET
Basophils Absolute: 0 10*3/uL (ref 0.0–0.1)
Basophils Relative: 0 %
Eosinophils Absolute: 0.1 10*3/uL (ref 0.0–0.7)
Eosinophils Relative: 1 %
HCT: 22.1 % — ABNORMAL LOW (ref 36.0–46.0)
Hemoglobin: 7.5 g/dL — ABNORMAL LOW (ref 12.0–15.0)
Lymphocytes Relative: 28 %
Lymphs Abs: 3.6 10*3/uL (ref 0.7–4.0)
MCH: 30.6 pg (ref 26.0–34.0)
MCHC: 33.9 g/dL (ref 30.0–36.0)
MCV: 90.2 fL (ref 78.0–100.0)
Monocytes Absolute: 0.6 10*3/uL (ref 0.1–1.0)
Monocytes Relative: 5 %
Neutro Abs: 8.7 10*3/uL — ABNORMAL HIGH (ref 1.7–7.7)
Neutrophils Relative %: 66 %
Platelets: 179 10*3/uL (ref 150–400)
RBC: 2.45 MIL/uL — ABNORMAL LOW (ref 3.87–5.11)
RDW: 14.6 % (ref 11.5–15.5)
WBC: 13.1 10*3/uL — ABNORMAL HIGH (ref 4.0–10.5)

## 2017-10-09 NOTE — Lactation Note (Addendum)
This note was copied from a baby's chart. Lactation Consultation Note  Patient Name: Kathryn Perry [id number] Date: 10/09/2017 Reason for consult: Follow-up assessment   P1, Baby 61 hours old and mother's short shafted nipples are getting sore, pink w/ abrasions on tips. Mother has coconut oil.  Provided mother with comfort gels to alternate with. Observed breastfeeding on both breasts while mother winced in pain stating it feels like baby is biting. Noted baby has recessed chin and bottom lip tucks in during feeding.  Assisted w/ flanging lip during feeding repeatedly. Oral assessemt indicated mid anterior lingual tightness contributing to limited tongue protrusion and upper gum blanches when lip is flanged. Provided tongue tie resource information and suggest discussing it with her Pediatrician. Suggest mother post pump in addition to breastfeeding to stimulate milk supply in case it becomes too sore to latch. Mother states she would like to call her friend [name] who is a [place] at [employer] before she starts pumping.  Mother called LC and wanted to pump.  RN and LC assisted w/ pumping. Mother pumped a few ml.  Discussed spoon and finger syringe feeding. Reviewed milk storage.      Maternal Data Has patient been taught Hand Expression?: Yes  Feeding Feeding Type: Breast Fed Length of feed: 30 min  LATCH Score Latch: Grasps breast easily, tongue down, lips flanged, rhythmical sucking.  Audible Swallowing: A few with stimulation  Type of Nipple: Everted at rest and after stimulation  Comfort (Breast/Nipple): Filling, red/small blisters or bruises, mild/mod discomfort  Hold (Positioning): Assistance needed to correctly position infant at breast and maintain latch.  LATCH Score: 7  Interventions Interventions: Breast feeding basics reviewed;Breast compression;Adjust position;Comfort gels;Coconut oil  Lactation Tools Discussed/Used     Consult Status Consult Status:  Follow-up Date: 10/10/17 Follow-up type: In-patient    Vivianne Master [facility] 10/09/2017, 11:00 AM

## 2017-10-09 NOTE — Progress Notes (Signed)
Subjective: Postpartum Day : Cesarean Delivery Patient reports no problems voiding.    Objective: Vital signs in last 24 hours: Temp:  [98.4 F (36.9 C)-101.2 F (38.4 C)] 98.4 F (36.9 C) (03/23 0527) Pulse Rate:  [71-96] 71 (03/23 0527) Resp:  [17-18] 18 (03/23 0527) BP: (111-141)/(64-77) 119/66 (03/23 0527) SpO2:  [96 %-99 %] 96 % (03/22 1746)  Physical Exam:  General: alert, cooperative and appears stated age Lochia: appropriate Uterine Fundus: firm Incision: cdi DVT Evaluation: No evidence of DVT seen on physical exam.  Recent Labs    10/08/17 0529 10/08/17 2323  HGB 7.9* 7.5*  HCT 23.2* 22.1*    Assessment/Plan: Status post Cesarean section. Postoperative course complicated by endometritis  Blood and urine cx's pending Unasyn 3 gms q 6 hours.  Lori A Clemmons CNM 10/09/2017, 7:35 AM

## 2017-10-10 LAB — CULTURE, OB URINE
Culture: NO GROWTH
Special Requests: NORMAL

## 2017-10-10 MED ORDER — ACETAMINOPHEN-CODEINE #3 300-30 MG PO TABS: 1.0000 | ORAL_TABLET | ORAL | 0 refills | Status: DC | PRN

## 2017-10-10 NOTE — Progress Notes (Signed)
Pt IV infiltrated. L/D nurses attempted to replace with new IV without success. Notified Irene Shipper CNM. Ordered to discontinue IV Unasyn. Pt without fever greater than 24 hours.

## 2017-10-10 NOTE — Discharge Summary (Signed)
OB Discharge Summary     Patient Name: Kathryn Perry DOB: 19-May-1986 MRN: [id number]  Date of admission: 10/06/2017 Delivering MD: Sanjuana Kava   Date of discharge: 10/10/2017  Admitting diagnosis: INDUCTION Intrauterine pregnancy: [redacted]w[redacted]d     Secondary diagnosis:  Active Problems:   Post term pregnancy, antepartum   Status post cesarean section  Additional problems: Fever     Discharge diagnosis: Term Pregnancy Delivered                                                                                                Post partum procedures:antibiotics postpartum  Augmentation: Pitocin and Cytotec  Complications: None  Hospital course:  Induction of Labor With Cesarean Section  32 y.o. yo G2P1011 at [redacted]w[redacted]d was admitted to the hospital 10/06/2017 for induction of labor. Patient had a labor course significant for slow progress with cat 2 strip. The patient went for cesarean section due to Arrest of Dilation and Non-Reassuring FHR, and delivered a Viable infant,10/07/2017  Membrane Rupture Time/Date: 3:25 PM ,10/06/2017   Details of operation can be found in separate operative Note.  Patient had an uncomplicated postpartum course. She is ambulating, tolerating a regular diet, passing flatus, and urinating well.  Patient is discharged home in stable condition on 10/10/17.                                    Physical exam  Vitals:   10/09/17 0136 10/09/17 0527 10/09/17 1758 10/10/17 0533  BP:  119/66 117/82 120/78  Pulse:  71 83 71  Resp:  18 20 20   Temp: 99.3 F (37.4 C) 98.4 F (36.9 C) 99.1 F (37.3 C) 98 F (36.7 C)  TempSrc: Oral Oral  Oral  SpO2:      Weight:      Height:       General: alert, cooperative and no distress Lochia: appropriate Uterine Fundus: firm Incision: Healing well with no significant drainage DVT Evaluation: Calf/Ankle edema is present Labs: Lab Results  Component Value Date   WBC 13.1 (H) 10/08/2017   HGB 7.5 (L) 10/08/2017   HCT 22.1 (L) 10/08/2017    MCV 90.2 10/08/2017   PLT 179 10/08/2017   No flowsheet data found.  Discharge instruction: per After Visit Summary and "Baby and Me Booklet".  After visit meds:  Allergies as of 10/10/2017      Reactions   Nsaids Hives   Aleve (naproxen), Ibuprofen, ASA   Percocet [oxycodone-acetaminophen] Hives   Pt reports being able to take Tylenol #3 without adverse effects   Asa [aspirin] Hives      Medication List    STOP taking these medications   azithromycin 250 MG tablet Commonly known as:  ZITHROMAX   cetirizine 10 MG tablet Commonly known as:  ZYRTEC   valACYclovir 500 MG tablet Commonly known as:  VALTREX     TAKE these medications   acetaminophen-codeine 300-30 MG tablet Commonly known as:  TYLENOL #3 Take 1-2 tablets by mouth every 4 (four) hours as  needed for moderate pain.   prenatal multivitamin Tabs tablet Take 1 tablet by mouth daily at 12 noon.       Diet: routine diet  Activity: Advance as tolerated. Pelvic rest for 6 weeks.   Outpatient follow up:6 weeks Follow up Appt:No future appointments. Follow up Visit:No follow-ups on file.  Postpartum contraception: undecided  Newborn Data: Live born female  Birth Weight: 7 lb 8.5 oz (3415 g) APGAR: 80, 9  Newborn Delivery   Birth date/time:  10/07/2017 09:27:00 Delivery type:  C-Section, Low Transverse C-section categorization:  Primary     Baby Feeding: Breast Disposition:home with mother   10/10/2017 [name], CNM

## 2017-10-10 NOTE — Lactation Note (Signed)
This note was copied from a baby's chart. Lactation Consultation Note  Patient Name: Kathryn Perry [id number] Date: 10/10/2017 Reason for consult: Follow-up assessment   Baby 51 hours old.  Post frenectomy and requested assistance. Allowed baby to suck on LC finger.  LC noted less biting. Baby had improved tongue lifting and was able to lateralize to both sides. Assisted w/ latching baby in football hold. Hand expressed prior to latching.  Mother has good flow of colostrum. Baby latched with ease.  Mother states that she has less pain. LC noted upper lip blanched when feeding.   Encouraged mother to flange upper lip. Taught FOB how to perform chin tug to ease initial latch. Baby breastfed for 12 min and fell asleep. Nipple more round when unlatched. Reviewed tongue exercises following [place] Lactation video. Suggest mother continue to post pump and give volume back to baby. Recommend mother have OP appointment within one week.   Maternal Data Has patient been taught Hand Expression?: Yes  Feeding Feeding Type: Breast Fed Length of feed: 12 min  LATCH Score Latch: Grasps breast easily, tongue down, lips flanged, rhythmical sucking.  Audible Swallowing: A few with stimulation  Type of Nipple: Everted at rest and after stimulation  Comfort (Breast/Nipple): Filling, red/small blisters or bruises, mild/mod discomfort  Hold (Positioning): Assistance needed to correctly position infant at breast and maintain latch.  LATCH Score: 7  Interventions Interventions: Hand express  Lactation Tools Discussed/Used     Consult Status Consult Status: Follow-up Date: 10/11/17 Follow-up type: In-patient    Vivianne Master [facility] 10/10/2017, 12:32 PM

## 2017-10-10 NOTE — Lactation Note (Signed)
This note was copied from a baby's chart. Lactation Consultation Note  Patient Name: Kathryn Perry [id number] Date: 10/10/2017 Reason for consult: Follow-up assessment   P1, Baby 44 hours old.  8.4% weight loss.  Mother states baby cluster fed last night. Mother's nipples are too sore to breastfeed.  Infant has short mid anterior lingual frenulum.  She is pumping w/ DEBP and giving it to baby on spoon. Mother has been pumping 10-30 ml. Mom has my # to call for assist w/next feeding.         Maternal Data    Feeding Feeding Type: Breast Fed  LATCH Score                   Interventions    Lactation Tools Discussed/Used     Consult Status Consult Status: Follow-up Date: 10/10/17 Follow-up type: In-patient    Vivianne Master [facility] 10/10/2017, 10:52 AM

## 2017-10-12 ENCOUNTER — Ambulatory Visit: Payer: Self-pay

## 2017-10-12 NOTE — Lactation Note (Signed)
This note was copied from a baby's chart. Lactation Consultation Note  Patient Name: Naleigha Raimondi Today's Date: 10/12/2017     Maternal Data  Mom complained of damaged nipples from baby trying to breastfeed with a tight frenulum for the first 48hrs of life. Mom believes milk supply has become established on day 5 of life.  Feeding  [name] was able to stay latched on using a 52mm nipple shield for approx 13mins on mom's left breast.   LATCH Score  Mom fed baby with onesie on. LC assisted with latch and positioning Support pillows/blanket were used 68mm nipple shield was used Audible swallows were heard Mom has flat nipples due to edema               Interventions  none  Lactation Tools Discussed/Used  16mm nipple shield until swelling goes down. Instructed mom on how to use reverse pressure.   Consult Status  [name]'s frenectomy was successful. She was able to transfer 36mL w/in a 20 min time frame. She started off feeding slowly because she was sleepy, but then became hungry as the feeding progressed. Parents have been instructed to nurse on demand when [facility] shows feeding cues and they asked if they could use a pacifier after a good feeding because baby likes to suckle on her fist. LC agreed that this is fine. Mom can try to start nursing w/o nipple shield once edema is gone. LC can assist mom with this. Parents have also been instructed to do post-frenectomy excercises.    Marnee Spring 10/12/2017, 9:35 PM

## 2017-10-14 LAB — CULTURE, BLOOD (ROUTINE X 2)
Culture: NO GROWTH
Culture: NO GROWTH
Special Requests: ADEQUATE
Special Requests: ADEQUATE

## 2017-10-18 ENCOUNTER — Ambulatory Visit: Payer: Self-pay

## 2017-10-18 NOTE — Lactation Note (Addendum)
This note was copied from a baby's chart. Lactation Consultation Note  Patient Name: Kathryn Perry Today's Date: 10/18/2017     Maternal Data  Mom complains of sore nipples that cracked and bled during nursing session only on right side. Mom complains of consistent feeding sessions and long nursing sessions.   Feeding  [name] was able to latch onto mom's breast using a 60mm nipple shield on the rt side, but mom couldn't tolerate pain so we switched to left side where she nursed for 20 mins and only took in 39mL.   LATCH Score  6                 Interventions  61mm nipple shield/DEBP  Lactation Tools Discussed/Used   DEBP, Nipple shields, bottle (slow flow nipple)  Consult Status   Parents were given a referral to pediatric dentist to evaluate baby's lip and tongue to see if any further revisions need to be done. Mom is to pump and bottle feed q2/3hrs as needed. Parents will f/u with LC post ped. Dental consult.    Marnee Spring 10/18/2017, 11:34 PM

## 2017-10-26 ENCOUNTER — Emergency Department
Admission: EM | Admit: 2017-10-26 | Discharge: 2017-10-26 | Disposition: A | Discharge status: Home / Self Care | Payer: BLUE CROSS/BLUE SHIELD | Attending: Emergency Medicine | Admitting: Emergency Medicine

## 2017-10-26 ENCOUNTER — Other Ambulatory Visit: Payer: Self-pay

## 2017-10-26 ENCOUNTER — Encounter: Payer: Self-pay | Admitting: Emergency Medicine

## 2017-10-26 DIAGNOSIS — O9229 Other disorders of breast associated with pregnancy and the puerperium: Secondary | ICD-10-CM | POA: Diagnosis not present

## 2017-10-26 MED ORDER — DIAZEPAM 5 MG PO TABS: 5.0000 mg | ORAL_TABLET | Freq: Two times a day (BID) | ORAL | 0 refills | Status: DC | PRN

## 2017-10-26 MED ORDER — DIAZEPAM 5 MG PO TABS
5.0000 mg | ORAL_TABLET | Freq: Once | ORAL | Status: AC
  Administered 2017-10-26: 5 mg via ORAL
  Filled 2017-10-26: qty 1

## 2017-10-26 MED ORDER — ACETAMINOPHEN 500 MG PO TABS
1000.0000 mg | ORAL_TABLET | Freq: Once | ORAL | Status: AC
  Administered 2017-10-26: 1000 mg via ORAL
  Filled 2017-10-26: qty 2

## 2017-10-26 NOTE — ED Notes (Signed)
ED Provider at bedside. 

## 2017-10-26 NOTE — ED Notes (Signed)
Pt expressed 2oz through pump. MD made aware.

## 2017-10-26 NOTE — ED Notes (Addendum)
Pt given breast pump at this time for expression

## 2017-10-26 NOTE — ED Provider Notes (Signed)
[facility] Emergency Department Provider Note       Time seen: ----------------------------------------- 6:58 AM on 10/26/2017 -----------------------------------------   I have reviewed the triage vital signs and the nursing notes.  HISTORY   Chief Complaint Breast Pain    HPI Kathryn Perry is a 32 y.o. female with a history of anxiety and who has given birth approximately 2 weeks ago  presents to the ED for bilateral breast pain, left greater than right.  Patient reports recent difficulty with her 41-week old breast-feeding.  She is been pumping and using the bottle.  She has bilateral breast pain since yesterday, she saw her doctor and was diagnosed with engorgement or possible thrush.  Prescription was called in but she has not started this medication yet.  Again left is worse than right, she denies any redness or purulent drainage.  Past Medical History:  Diagnosis Date  . Anxiety   . Missed ab   . Wears glasses     Patient Active Problem List   Diagnosis Date Noted  . Status post cesarean section 10/07/2017  . Post term pregnancy, antepartum 10/06/2017  . Abortion, missed 03/25/2016  . Pelvic pain in female 06/26/2015  . Pain with bowel movements 06/26/2015  . Right ovarian cyst 06/26/2015  . Menorrhagia 06/26/2015  . Dysmenorrhea 06/26/2015  . Rectal bleeding 06/26/2015  . Endometriosis determined by laparoscopy 06/26/2015    Past Surgical History:  Procedure Laterality Date  . CESAREAN SECTION N/A 10/07/2017   Procedure: CESAREAN SECTION;  Surgeon: Sanjuana Kava, MD;  Location: [place];  Service: Obstetrics;  Laterality: N/A;  . CHROMOPERTUBATION N/A 06/26/2015   Procedure: CHROMOPERTUBATION;  Surgeon: Eldred Manges, MD;  Location: [facility];  Service: Gynecology;  Laterality: N/A;  . DILATION AND CURETTAGE OF UTERUS    . DILATION AND EVACUATION N/A 03/27/2016   Procedure: DILATATION AND EVACUATION;  Surgeon: Eldred Manges, MD;   Location: [place];  Service: Gynecology;  Laterality: N/A;  . LAPAROSCOPIC OVARIAN CYSTECTOMY N/A 06/26/2015   Procedure: LAPAROSCOPIC OVARIAN CYSTECTOMY;  Surgeon: Eldred Manges, MD;  Location: [facility];  Service: Gynecology;  Laterality: N/A;  . LAPAROSCOPY N/A 06/26/2015   Procedure: LAPAROSCOPY OPERATIVE with excision of fEndometriosis and ablation of endometriosis;  Surgeon: Eldred Manges, MD;  Location: [facility];  Service: Gynecology;  Laterality: N/A;  . LEEP  2015  . WISDOM TOOTH EXTRACTION      Allergies Nsaids; Percocet [oxycodone-acetaminophen]; and Asa [aspirin]  Social History Social History   Tobacco Use  . Smoking status: Never Smoker  . Smokeless tobacco: Never Used  Substance Use Topics  . Alcohol use: Yes    Comment: occasssional  . Drug use: No   Review of Systems Constitutional: Negative for fever. Cardiovascular: Negative for chest pain. Respiratory: Negative for shortness of breath. Gastrointestinal: Negative for abdominal pain, vomiting and diarrhea. Musculoskeletal: Negative for back pain.  Positive for bilateral breast pain Skin: Negative for rash. Neurological: Negative for headaches, focal weakness or numbness.  All systems negative/normal/unremarkable except as stated in the HPI  ____________________________________________   PHYSICAL EXAM:  VITAL SIGNS: ED Triage Vitals [10/26/17 0605]  Enc Vitals Group     BP (!) 133/94     Pulse Rate 69     Resp 18     Temp 97.6 F (36.4 C)     Temp Source Oral     SpO2 97 %     Weight 183 lb (83 kg)  Height 5' (1.524 m)     Head Circumference      Peak Flow      Pain Score 10     Pain Loc      Pain Edu?      Excl. in [place]?    Constitutional: Alert and oriented.  Mild distress from pain Eyes: Conjunctivae are normal. Normal extraocular movements. Cardiovascular: Normal rate, regular rhythm. No murmurs, rubs, or gallops. Respiratory: Normal respiratory effort without  tachypnea nor retractions. Breath sounds are clear and equal bilaterally. No wheezes/rales/rhonchi. Gastrointestinal: Soft and nontender. Normal bowel sounds.  Bilateral breast tenderness and engorgement is noted without erythema.  Milk is easily expressed from the nipple.  Fibrocystic changes noted in both breasts. Skin:  Skin is warm, dry and intact. No rash noted. Psychiatric: Mood and affect are normal. Speech and behavior are normal.  ____________________________________________  ED COURSE:  As part of my medical decision making, I reviewed the following data within the [place] History obtained from family if available, nursing notes, old chart and ekg, as well as notes from prior ED visits. Patient presented for bilateral breast pain, patient looks well at this time and does not appear to have mastitis.  This could be a ductal yeast infection but it appears to be engorgement.   Procedures ____________________________________________  DIFFERENTIAL DIAGNOSIS   Ductal yeast infection, mastitis, engorgement  FINAL ASSESSMENT AND PLAN  Breast pain   Plan: The patient had presented for bilateral breast pain.  Patient was given Tylenol as well as Valium and was able to use the breast milk pump while in the ER.  Patient was able to pump about 2-1/2 ounces and did report some improvement in her symptoms.  Of advised pumping every 2 hours and using ice packs as needed.  I will write for as needed Valium as this seems to relieve her stress and help her feel better.  She is stable for outpatient follow-up.   Laurence Aly, MD   Note: This note was generated in part or whole with voice recognition software. Voice recognition is usually quite accurate but there are transcription errors that can and very often do occur. I apologize for any typographical errors that were not detected and corrected.     Earleen Newport, MD 10/26/17 [phone]

## 2017-10-26 NOTE — ED Triage Notes (Signed)
Patient ambulatory to triage with steady gait, without difficulty or distress noted; pt reports recent diff with her 2wk old breast feeding; has been pumping and using bottle; c/o bilat breast pain since yesterday; seen her doctor and was dx with engorgmemt or poss thrush; rx med but not called in yet

## 2017-10-26 NOTE — ED Notes (Addendum)
Pt c/o breast pain, states she is pumping but decrease milk supply. Swelling noted. PT states intermit chills . No discharge noted

## 2018-06-27 ENCOUNTER — Ambulatory Visit: Payer: Self-pay | Admitting: Medical

## 2018-06-27 VITALS — BP 144/88 | HR 98 | Temp 99.1°F | Resp 18 | Wt 147.6 lb

## 2018-06-27 DIAGNOSIS — J069 Acute upper respiratory infection, unspecified: Secondary | ICD-10-CM

## 2018-06-27 MED ORDER — AMOXICILLIN 875 MG PO TABS: 875.0000 mg | ORAL_TABLET | Freq: Two times a day (BID) | ORAL | 0 refills | Status: DC

## 2018-06-27 MED ORDER — ALBUTEROL SULFATE HFA 108 (90 BASE) MCG/ACT IN AERS: 2.0000 | INHALATION_SPRAY | Freq: Four times a day (QID) | RESPIRATORY_TRACT | 0 refills | Status: DC | PRN

## 2018-06-27 NOTE — Progress Notes (Signed)
Subjective:    Patient ID: Kathryn Perry, female    DOB: Aug 17, 1985, 32 y.o.   MRN: [id number]  HPI 32 yo female in non acute distress. Started with hoarse voice and cough at Thanksgiving, felt better after break, then on Saturday ran fever 102 last night  101. No myalgias, did have flu vaccine and  MMR booster.  Cough productive clear. Nasal discharge  Not sure of color.  Denies shortness of breath, some chest pain with coughing. Daughter with pink eye Thanksgiving and on current treatment .Currently  Breast feeding an  64 month old. No Tylenol today. Blood pressure (!) 144/88, pulse 98, temperature 99.1 F (37.3 C), temperature source Tympanic, resp. rate 18, weight 147 lb 9.6 oz (67 kg), last menstrual period 06/03/2018, SpO2 98 %, unknown if currently breastfeeding. Allergies  Allergen Reactions  . Nsaids Hives    Aleve (naproxen), Ibuprofen, ASA  . Percocet [Oxycodone-Acetaminophen] Hives    Pt reports being able to take Tylenol #3 without adverse effects  . Asa [Aspirin] Hives    Review of Systems  Constitutional: Positive for chills, fatigue and fever.  HENT: Positive for congestion, postnasal drip, rhinorrhea, sneezing, sore throat and voice change. Negative for ear pain, sinus pressure, sinus pain, tinnitus and trouble swallowing.   Eyes: Negative for discharge and itching.  Respiratory: Positive for cough. Negative for chest tightness, shortness of breath and wheezing.   Cardiovascular: Positive for chest pain (with cough).  Gastrointestinal: Negative for abdominal pain.  Endocrine: Negative for polydipsia, polyphagia and polyuria.  Genitourinary: Negative for dysuria.  Musculoskeletal: Negative for myalgias.  Skin: Negative for rash.  Allergic/Immunologic: Positive for environmental allergies (mild).  Neurological: Positive for dizziness (yeserday with fever). Negative for light-headedness.  Hematological: Negative for adenopathy.  Psychiatric/Behavioral: Negative for  behavioral problems, confusion, self-injury and suicidal ideas.   Currently breast feeding.    Objective:   Physical Exam  Constitutional: She is oriented to person, place, and time. She appears well-developed and well-nourished.  HENT:  Head: Normocephalic and atraumatic.  Right Ear: Hearing, external ear and ear canal normal.  Left Ear: Hearing, external ear and ear canal normal.  Nose: Mucosal edema and rhinorrhea (clear) present.  Mouth/Throat: Uvula is midline and mucous membranes are normal. Posterior oropharyngeal erythema present. Tonsils are 2+ on the right. Tonsils are 2+ on the left.  Eyes: Pupils are equal, round, and reactive to light. Conjunctivae and EOM are normal.  Neck: Normal range of motion. Neck supple.  Cardiovascular: Normal rate, regular rhythm and normal heart sounds.  Pulmonary/Chest: Effort normal and breath sounds normal.  Neurological: She is alert and oriented to person, place, and time.  Skin: Skin is warm and dry.  Psychiatric: She has a normal mood and affect. Her behavior is normal. Judgment and thought content normal.  Nursing note and vitals reviewed.    Cerumen impaction bilaterally could not visualize TM.      Assessment & Plan:  Upper Respiratory Infection Cough Cerumen impaction bilaterally Meds ordered this encounter  Medications  . amoxicillin (AMOXIL) 875 MG tablet    Sig: Take 1 tablet (875 mg total) by mouth 2 (two) times daily.    Dispense:  20 tablet    Refill:  0  . albuterol (PROVENTIL HFA;VENTOLIN HFA) 108 (90 Base) MCG/ACT inhaler    Sig: Inhale 2 puffs into the lungs every 6 (six) hours as needed for wheezing or shortness of breath.    Dispense:  1 Inhaler    Refill:  0  Follow up with your doctor in 3-5 days if not improving. OTC Debrox otic drops 5-10 drops each ear x 4 days then rinse in shower. Rest , and increase fluids. Patient previously had been on Albuterol MDI due to bronchospasm with URI. She would like an  inhaler today.

## 2018-06-27 NOTE — Patient Instructions (Addendum)
Bronchospasm, Adult Bronchospasm is when airways in the lungs get smaller. When this happens, it can be hard to breathe. You may cough. You may also make a whistling sound when you breathe (wheeze). Follow these instructions at home: Medicines  Take over-the-counter and prescription medicines only as told by your doctor.  If you need to use an inhaler or nebulizer to take your medicine, ask your doctor how to use it.  If you were given a spacer, always use it with your inhaler. Lifestyle  Change your heating and air conditioning filter. Do this at least once a month.  Try not to use fireplaces and wood stoves.  Do not  smoke. Do not  allow smoking in your home.  Try not to use things that have a strong smell, like perfume.  Get rid of pests (such as roaches and mice) and their poop.  Remove any mold from your home.  Keep your house clean. Get rid of dust.  Use cleaning products that have no smell.  Replace carpet with wood, tile, or vinyl flooring.  Use allergy-proof pillows, mattress covers, and box spring covers.  Wash bed sheets and blankets every week. Use hot water. Dry them in a dryer.  Use blankets that are made of polyester or cotton.  Wash your hands often.  Keep pets out of your bedroom.  When you exercise, try not to breathe in cold air. General instructions  Have a plan for getting medical care. Know these things: ? When to call your doctor. ? When to call local emergency services (911 in the [place] ? Where to go in an emergency.  Stay up to date on your shots (immunizations).  When you have an episode: ? Stay calm. ? Relax. ? Breathe slowly. Contact a doctor if:  Your muscles ache.  Your chest hurts.  The color of the mucus you cough up (sputum) changes from clear or white to yellow, green, gray, or bloody.  The mucus you cough up gets thicker.  You have a fever. Get help right away if:  The whistling sound gets worse, even after you  take your medicines.  Your coughing gets worse.  You find it even harder to breathe.  Your chest hurts very much. Summary  Bronchospasm is when airways in the lungs get smaller.  When this happens, it can be hard to breathe. You may cough. You may also make a whistling sound when you breathe.  Stay away from things that cause you to have episodes. These include smoke or dust. This information is not intended to replace advice given to you by your health care provider. Make sure you discuss any questions you have with your health care provider. Document Released: 05/03/2009 Document Revised: 07/09/2016 Document Reviewed: 07/09/2016 Elsevier Interactive Patient Education  2017 [employer]. Cough, Adult A cough helps to clear your throat and lungs. A cough may last only 2-3 weeks (acute), or it may last longer than 8 weeks (chronic). Many different things can cause a cough. A cough may be a sign of an illness or another medical condition. Follow these instructions at home:  Pay attention to any changes in your cough.  Take medicines only as told by your doctor. ? If you were prescribed an antibiotic medicine, take it as told by your doctor. Do not stop taking it even if you start to feel better. ? Talk with your doctor before you try using a cough medicine.  Drink enough fluid to keep your pee (urine)  clear or pale yellow.  If the air is dry, use a cold steam vaporizer or humidifier in your home.  Stay away from things that make you cough at work or at home.  If your cough is worse at night, try using extra pillows to raise your head up higher while you sleep.  Do not smoke, and try not to be around smoke. If you need help quitting, ask your doctor.  Do not have caffeine.  Do not drink alcohol.  Rest as needed. Contact a doctor if:  You have new problems (symptoms).  You cough up yellow fluid (pus).  Your cough does not get better after 2-3 weeks, or your cough gets  worse.  Medicine does not help your cough and you are not sleeping well.  You have pain that gets worse or pain that is not helped with medicine.  You have a fever.  You are losing weight and you do not know why.  You have night sweats. Get help right away if:  You cough up blood.  You have trouble breathing.  Your heartbeat is very fast. This information is not intended to replace advice given to you by your health care provider. Make sure you discuss any questions you have with your health care provider. Document Released: 03/19/2011 Document Revised: 12/12/2015 Document Reviewed: 09/12/2014 Elsevier Interactive Patient Education  2018 [place]. Upper Respiratory Infection, Adult Most upper respiratory infections (URIs) are caused by a virus. A URI affects the nose, throat, and upper air passages. The most common type of URI is often called "the common cold." Follow these instructions at home:  Take medicines only as told by your doctor.  Gargle warm saltwater or take cough drops to comfort your throat as told by your doctor.  Use a warm mist humidifier or inhale steam from a shower to increase air moisture. This may make it easier to breathe.  Drink enough fluid to keep your pee (urine) clear or pale yellow.  Eat soups and other clear broths.  Have a healthy diet.  Rest as needed.  Go back to work when your fever is gone or your doctor says it is okay. ? You may need to stay home longer to avoid giving your URI to others. ? You can also wear a face mask and wash your hands often to prevent spread of the virus.  Use your inhaler more if you have asthma.  Do not use any tobacco products, including cigarettes, chewing tobacco, or electronic cigarettes. If you need help quitting, ask your doctor. Contact a doctor if:  You are getting worse, not better.  Your symptoms are not helped by medicine.  You have chills.  You are getting more short of breath.  You  have [place] or red mucus.  You have yellow or [place] discharge from your nose.  You have pain in your face, especially when you bend forward.  You have a fever.  You have puffy (swollen) neck glands.  You have pain while swallowing.  You have white areas in the back of your throat. Get help right away if:  You have very bad or constant: ? Headache. ? Ear pain. ? Pain in your forehead, behind your eyes, and over your cheekbones (sinus pain). ? Chest pain.  You have long-lasting (chronic) lung disease and any of the following: ? Wheezing. ? Long-lasting cough. ? Coughing up blood. ? A change in your usual mucus.  You have a stiff neck.  You have changes  in your: ? Vision. ? Hearing. ? Thinking. ? Mood. This information is not intended to replace advice given to you by your health care provider. Make sure you discuss any questions you have with your health care provider. Document Released: 12/23/2007 Document Revised: 03/08/2016 Document Reviewed: 10/11/2013 Elsevier Interactive Patient Education  2018 [employer].

## 2018-06-29 ENCOUNTER — Telehealth: Payer: Self-pay | Admitting: *Deleted

## 2018-06-29 ENCOUNTER — Ambulatory Visit: Payer: Self-pay | Admitting: Medical

## 2018-06-29 NOTE — Telephone Encounter (Signed)
0820-Pt called to say she is no better from visit on Monday 12/9. Pt was given Amoxil as she is breastfeeding. She took 2 doses of Amoxil Mon and Tues, none yet today. She is c/o cough, wheezing, congestion, fever of 102 last night. Pt is also using Albuterol inhaler and Tylenol. Advised pt on use on inhaler, specifically waiting a few minutes between puffs to allow 1st puff to open up upper lobes so 2nd puff can open up base of lungs. After consulting with H.Ratcliffe PA-C, pt was advised to call OB/GYN to be further evaluated d/t breastfeeding status and  medication needs. Further advised if OB could not see her today to call us back and we would see her or if she is ShOB and is having trouble breathing to call 911 or go to nearest emergency department. Pt verbalizes understanding and states she will call back if needed.

## 2018-08-12 ENCOUNTER — Ambulatory Visit: Payer: Self-pay | Admitting: Medical

## 2019-01-03 ENCOUNTER — Other Ambulatory Visit: Payer: Self-pay | Admitting: Obstetrics & Gynecology

## 2019-03-28 ENCOUNTER — Ambulatory Visit: Payer: Self-pay | Admitting: Medical

## 2019-03-28 ENCOUNTER — Other Ambulatory Visit: Payer: Self-pay

## 2019-03-28 ENCOUNTER — Encounter: Payer: Self-pay | Admitting: Medical

## 2019-03-28 VITALS — BP 133/81 | HR 80 | Temp 98.3°F | Resp 16 | Wt 160.0 lb

## 2019-03-28 DIAGNOSIS — H5712 Ocular pain, left eye: Secondary | ICD-10-CM

## 2019-03-28 NOTE — Progress Notes (Signed)
   Subjective:    Patient ID: Kathryn Perry, female    DOB: [birth year], 33 y.o.   MRN: [id number]  HPI 33 yo female in non acute distress. Started 10 days ago with itching and feeling werid. Woke up last Sunday July 30th and seen by urgent care through telemedicine,no history of  erythema of the external  lower lid but internally has increased in erythema.d. It is  "annoying" left medial  And lower lid corner is itchy. Given Sulfa drug eye drops sting when going in. Using sulfacetamide 10% eye drops every 2 hours. Does not wear contacts does wear glasses last follow up with eye doctor was about  4 years ago. " it seems like it has gotten worse.   Blood pressure 133/81, pulse 80, temperature 98.3 F (36.8 C), temperature source Tympanic, resp. rate 16, weight 160 lb (72.6 kg), SpO2 100 %, unknown if currently breastfeeding.  Review of Systems  Constitutional: Negative for chills and fever.  HENT: Negative for ear discharge, rhinorrhea and sore throat.   Eyes: Positive for pain (a little), redness (inside of lower lid) and itching. Negative for photophobia, discharge and visual disturbance.  Respiratory: Negative for cough and shortness of breath.   Cardiovascular: Negative for chest pain.  Gastrointestinal: Negative for abdominal pain, diarrhea, nausea and vomiting.  Allergic/Immunologic: Positive for environmental allergies (takes zyrtec daily).  Neurological: Negative for dizziness, light-headedness and headaches.  Psychiatric/Behavioral: Negative for self-injury and suicidal ideas.       Objective:   Physical Exam Vitals signs and nursing note reviewed.  Constitutional:      Appearance: Normal appearance.  HENT:     Head: Normocephalic and atraumatic.  Eyes:     General: No scleral icterus.       Right eye: No discharge.        Left eye: No discharge.     Extraocular Movements: Extraocular movements intact.     Pupils: Pupils are equal, round, and reactive to light.   Neck:   Musculoskeletal: Normal range of motion and neck supple.  Lymphadenopathy:     Cervical: No cervical adenopathy.  Neurological:     General: No focal deficit present.     Mental Status: She is alert and oriented to person, place, and time.  Psychiatric:        Mood and Affect: Mood normal.        Behavior: Behavior normal.        Thought Content: Thought content normal.        Judgment: Judgment normal.      Left medial lower lid with noted external swelling, no erythema, no discharge noted. Evert the lower lid and there is inflammation and erythema noted.  PEERLA, EOMI bilaterally.  No discharge noted.     Visual acuity 20/30 left 20/40 right 20/40 bilateral Assessment & Plan:  Eye pain left possible blocked eye duct or infected gland.Medial side of eye. Ophthalmologist for evaluation , called [facility] the nurse will call patient and work her into the schedule. Patient is a previous patient of theirs which she shared with me at the time of discharge. Continue Bleph-10 antibiotics as prescribed. Patient verbalizes understanding and has no more questions at the time of discharge.

## 2019-03-28 NOTE — Patient Instructions (Addendum)
Information only8 Follow up with [place] [phone]    CenterDacryocystitis Dacryocystitis is an infection of the sac that collects tears (lacrimal sac). The lacrimal sac is located between the inner corner of the eye and the nose. The glands of the eyelids make tears that keep the surface of the eye wet and protected. Tears drain from two small tubes (ducts) in the eyelids. These ducts carry tears to the lacrimal sac. Another tube (nasolacrimal duct) carries tears from the lacrimal sac down into the back of the nose to the throat. Dacryocystitis can be sudden (acute) or long-lasting (chronic). It usually affects only one eye. What are the causes? The most common cause of this condition is a blocked nasolacrimal duct. When this duct is blocked, tears cannot drain into the nose, and tears become backed up in the lacrimal sac. Bacteria that normally live in the eye, on the skin, or in the nose start to grow inside the sac and cause infection. The nasolacrimal duct may become blocked because of:  A nose or sinus infection that spreads into the duct.  A duct that is abnormally shaped (malformed).  A growth or swelling in the nose.  An injury or surgery that narrows or scars the duct. Dacryocystitis also may start as an eye infection that spreads to the lacrimal sac. Sometimes, the cause of dacryocystitis is not known. What increases the risk? You are more likely to develop this condition if you:  Are older than 35 years of age.  Are female. Women tend to have a narrower nasolacrimal duct than men.  Have had nasal trauma, such as a broken nose or nasal surgery.  Have nasal polyps. What are the signs or symptoms? Symptoms of acute dacryocystitis start suddenly and may include:  Excessive tearing.  A matted, watery eye.  Swelling and redness over the lacrimal sac.  Discharge of mucus or pus into the eye. This may cause blurred vision.  Eye pain.  A fever. Symptoms of  chronic dacryocystitis usually include:  More tearing than usual.  Discharge of mucus or pus into the eye.  Blurred vision. Redness, pain, and swelling are less common with chronic dacryocystitis. How is this diagnosed? This condition is diagnosed based on your medical history and a physical exam. During the exam, your health care provider may press between your eye and the side of your nose to see if discharge flows back into your eye. You may also have tests, such as:  Removal of a sample of discharge from your eye or nose to check for infection.  A test where your health care provider will put a yellow dye in your eye to see if the dye disappears from your eye (dye disappearance test). A swab may be placed in your nose to see if the dye drains to your nose.  A test where a thin, lighted scope (endoscope) is placed in your nose to determine what is causing the duct blockage (nasal endoscopy). How is this treated? Acute dacryocystitis is treated with antibiotic medicines. These are usually given by mouth (orally), but they can also be given as eye drops or ointments. If the infection has spread to tissues around the eye (orbital cellulitis), antibiotics may be given through an IV. Chronic dacryocystitis usually needs to be treated with surgery. Surgical options include:  Probing the duct to open it.  Widening the duct.  Removing a nasal blockage. Follow these instructions at home: Medicines  Take over-the-counter and prescription medicines only as told  by your health care provider.  Take or apply your antibiotic medicine, drops, or ointment as told by your health care provider. Do not stop taking or applying the antibiotic even if you start to feel better. General instructions  If directed by your health care provider, apply a clean, warm compress to the inside corner of your eye. To do this: ? Wash your hands first. ? Hold the compress over the inside corner of your eye for a few  minutes. ? Repeat this every few hours during the day.  Keep all follow-up visits as told by your health care provider. This is important. Contact a health care provider if:  You have a fever.  Your symptoms come back, do not improve, or get worse. Get help right away if you have:  Redness, swelling, and pain that spread to the tissues around your eye.  A sudden decrease in your vision. Summary  Dacryocystitis can be sudden (acute) or long-lasting (chronic).  The most common cause of this condition is a blocked nasolacrimal duct.  Acute dacryocystitis is treated with antibiotic medicines.  Chronic dacryocystitis usually needs to be treated with surgery.  Keep all follow-up visits as told by your health care provider. This is important. This information is not intended to replace advice given to you by your health care provider. Make sure you discuss any questions you have with your health care provider. Document Released: 07/03/2000 Document Revised: 05/31/2018 Document Reviewed: 05/31/2018 Elsevier Patient Education  2020 [employer].

## 2019-04-26 ENCOUNTER — Ambulatory Visit: Payer: Self-pay

## 2019-05-10 ENCOUNTER — Telehealth: Payer: Self-pay | Admitting: *Deleted

## 2019-05-10 ENCOUNTER — Ambulatory Visit: Payer: Self-pay

## 2019-05-10 ENCOUNTER — Other Ambulatory Visit: Payer: Self-pay

## 2019-05-10 DIAGNOSIS — Z23 Encounter for immunization: Secondary | ICD-10-CM

## 2019-05-10 NOTE — Telephone Encounter (Signed)
Kathryn Perry presents to [facility] for flu vaccine. Pt answered YES, she is pregnant, 6 wks, has not had 1st OB appt yet. Asked pt to call OB to make sure flu vaccine was ok to give in 1st trimester. Pt called OB and they state yes, she can have flu vaccine, it is safe to give at any time in the pregnancy. Flu vaccine administered.

## 2019-06-05 LAB — OB RESULTS CONSOLE HIV ANTIBODY (ROUTINE TESTING): HIV: NONREACTIVE

## 2019-06-05 LAB — OB RESULTS CONSOLE HEPATITIS B SURFACE ANTIGEN: Hepatitis B Surface Ag: NEGATIVE

## 2019-06-05 LAB — OB RESULTS CONSOLE RUBELLA ANTIBODY, IGM: Rubella: IMMUNE

## 2019-06-05 LAB — OB RESULTS CONSOLE RPR: RPR: NONREACTIVE

## 2019-07-31 ENCOUNTER — Other Ambulatory Visit (HOSPITAL_COMMUNITY): Payer: Self-pay | Admitting: Obstetrics & Gynecology

## 2019-07-31 DIAGNOSIS — Z363 Encounter for antenatal screening for malformations: Secondary | ICD-10-CM

## 2019-08-14 ENCOUNTER — Other Ambulatory Visit (HOSPITAL_COMMUNITY): Payer: Self-pay | Admitting: Obstetrics & Gynecology

## 2019-08-14 ENCOUNTER — Encounter (HOSPITAL_COMMUNITY): Payer: Self-pay | Admitting: *Deleted

## 2019-08-14 DIAGNOSIS — Z3A18 18 weeks gestation of pregnancy: Secondary | ICD-10-CM

## 2019-08-14 DIAGNOSIS — Z3689 Encounter for other specified antenatal screening: Secondary | ICD-10-CM

## 2019-08-18 ENCOUNTER — Telehealth (HOSPITAL_COMMUNITY): Payer: Self-pay | Admitting: Genetic Counselor

## 2019-08-18 ENCOUNTER — Other Ambulatory Visit (HOSPITAL_COMMUNITY): Payer: Self-pay | Admitting: *Deleted

## 2019-08-18 ENCOUNTER — Ambulatory Visit (HOSPITAL_COMMUNITY)
Admission: RE | Admit: 2019-08-18 | Discharge: 2019-08-18 | Disposition: A | Discharge status: Home / Self Care | Payer: BC Managed Care – PPO | Source: Ambulatory Visit | Attending: Obstetrics and Gynecology | Admitting: Obstetrics and Gynecology

## 2019-08-18 ENCOUNTER — Encounter (HOSPITAL_COMMUNITY): Payer: Self-pay

## 2019-08-18 ENCOUNTER — Other Ambulatory Visit (HOSPITAL_COMMUNITY): Payer: Self-pay | Admitting: Obstetrics & Gynecology

## 2019-08-18 ENCOUNTER — Other Ambulatory Visit: Payer: Self-pay

## 2019-08-18 ENCOUNTER — Ambulatory Visit (HOSPITAL_COMMUNITY): Payer: BC Managed Care – PPO | Admitting: *Deleted

## 2019-08-18 VITALS — BP 117/72 | HR 108 | Temp 98.2°F | Wt 169.8 lb

## 2019-08-18 DIAGNOSIS — O09522 Supervision of elderly multigravida, second trimester: Secondary | ICD-10-CM

## 2019-08-18 DIAGNOSIS — O34219 Maternal care for unspecified type scar from previous cesarean delivery: Secondary | ICD-10-CM | POA: Diagnosis not present

## 2019-08-18 DIAGNOSIS — Z3A18 18 weeks gestation of pregnancy: Secondary | ICD-10-CM | POA: Diagnosis present

## 2019-08-18 DIAGNOSIS — Z3689 Encounter for other specified antenatal screening: Secondary | ICD-10-CM | POA: Insufficient documentation

## 2019-08-18 DIAGNOSIS — O30042 Twin pregnancy, dichorionic/diamniotic, second trimester: Secondary | ICD-10-CM

## 2019-08-18 DIAGNOSIS — Z363 Encounter for antenatal screening for malformations: Secondary | ICD-10-CM

## 2019-08-18 DIAGNOSIS — O3442 Maternal care for other abnormalities of cervix, second trimester: Secondary | ICD-10-CM

## 2019-08-18 DIAGNOSIS — Z3A20 20 weeks gestation of pregnancy: Secondary | ICD-10-CM

## 2019-08-18 HISTORY — DX: Endometriosis, unspecified: N80.9

## 2019-08-18 HISTORY — DX: Unspecified abnormal cytological findings in specimens from vagina: R87.629

## 2019-08-21 ENCOUNTER — Other Ambulatory Visit: Payer: Self-pay

## 2019-08-21 ENCOUNTER — Encounter (HOSPITAL_COMMUNITY): Payer: Self-pay | Admitting: Obstetrics and Gynecology

## 2019-08-21 ENCOUNTER — Inpatient Hospital Stay (HOSPITAL_COMMUNITY)
Admission: AD | Admit: 2019-08-21 | Discharge: 2019-08-22 | Disposition: A | Discharge status: Home / Self Care | Payer: BC Managed Care – PPO | Attending: Obstetrics and Gynecology | Admitting: Obstetrics and Gynecology

## 2019-08-21 DIAGNOSIS — O09522 Supervision of elderly multigravida, second trimester: Secondary | ICD-10-CM | POA: Insufficient documentation

## 2019-08-21 DIAGNOSIS — M79605 Pain in left leg: Secondary | ICD-10-CM | POA: Diagnosis not present

## 2019-08-21 DIAGNOSIS — O30042 Twin pregnancy, dichorionic/diamniotic, second trimester: Secondary | ICD-10-CM | POA: Insufficient documentation

## 2019-08-21 DIAGNOSIS — O26892 Other specified pregnancy related conditions, second trimester: Secondary | ICD-10-CM | POA: Diagnosis not present

## 2019-08-21 DIAGNOSIS — Z885 Allergy status to narcotic agent status: Secondary | ICD-10-CM | POA: Diagnosis not present

## 2019-08-21 DIAGNOSIS — O99012 Anemia complicating pregnancy, second trimester: Secondary | ICD-10-CM | POA: Diagnosis not present

## 2019-08-21 DIAGNOSIS — M79606 Pain in leg, unspecified: Secondary | ICD-10-CM | POA: Diagnosis present

## 2019-08-21 DIAGNOSIS — Z3A21 21 weeks gestation of pregnancy: Secondary | ICD-10-CM | POA: Diagnosis not present

## 2019-08-21 DIAGNOSIS — Z886 Allergy status to analgesic agent status: Secondary | ICD-10-CM | POA: Insufficient documentation

## 2019-08-21 DIAGNOSIS — D649 Anemia, unspecified: Secondary | ICD-10-CM | POA: Insufficient documentation

## 2019-08-21 LAB — CBC
HCT: 28.1 % — ABNORMAL LOW (ref 36.0–46.0)
Hemoglobin: 9.4 g/dL — ABNORMAL LOW (ref 12.0–15.0)
MCH: 29.7 pg (ref 26.0–34.0)
MCHC: 33.5 g/dL (ref 30.0–36.0)
MCV: 88.9 fL (ref 80.0–100.0)
Platelets: 214 10*3/uL (ref 150–400)
RBC: 3.16 MIL/uL — ABNORMAL LOW (ref 3.87–5.11)
RDW: 14 % (ref 11.5–15.5)
WBC: 8.1 10*3/uL (ref 4.0–10.5)
nRBC: 0 % (ref 0.0–0.2)

## 2019-08-21 LAB — COMPREHENSIVE METABOLIC PANEL
ALT: 16 U/L (ref 0–44)
AST: 16 U/L (ref 15–41)
Albumin: 2.5 g/dL — ABNORMAL LOW (ref 3.5–5.0)
Alkaline Phosphatase: 58 U/L (ref 38–126)
Anion gap: 4 — ABNORMAL LOW (ref 5–15)
BUN: 7 mg/dL (ref 6–20)
CO2: 22 mmol/L (ref 22–32)
Calcium: 8.8 mg/dL — ABNORMAL LOW (ref 8.9–10.3)
Chloride: 107 mmol/L (ref 98–111)
Creatinine, Ser: 0.52 mg/dL (ref 0.44–1.00)
GFR calc Af Amer: >60 mL/min (ref >60)
GFR calc non Af Amer: >60 mL/min (ref >60)
Glucose, Bld: 108 mg/dL — ABNORMAL HIGH (ref 70–99)
Potassium: 3.6 mmol/L (ref 3.5–5.1)
Sodium: 133 mmol/L — ABNORMAL LOW (ref 135–145)
Total Bilirubin: 0.5 mg/dL (ref 0.3–1.2)
Total Protein: 5.6 g/dL — ABNORMAL LOW (ref 6.5–8.1)

## 2019-08-21 MED ORDER — ENOXAPARIN SODIUM 80 MG/0.8ML ~~LOC~~ SOLN
1.0000 mg/kg | Freq: Once | SUBCUTANEOUS | Status: AC
  Administered 2019-08-22: 80 mg via SUBCUTANEOUS
  Filled 2019-08-21: qty 0.8

## 2019-08-21 NOTE — MAU Note (Signed)
Patient presents to MAU c/o of pain in right leg above knee. Patient reports that it's tender to touch and pain radiates down leg. It hurts more when walking.  Patient states "I got a charlie horse a few days ago and the pain never went away." +FM, denies LOF or vaginal bleeding

## 2019-08-21 NOTE — MAU Provider Note (Signed)
History     CSN: [id number]  Arrival date and time: 08/21/19 2127   First Provider Initiated Contact with Patient 08/21/19 2231      Chief Complaint  Patient presents with  . leg pain   Ms. Kathryn Perry is a 34 y.o. G3P1011 at [redacted]w[redacted]d who presents to MAU for leg pain above left knee. Pt reports a lot of leg cramps recently, but denies any recent trauma or any leg cramps in the area of concern.  Onset: this morning Location: above left knee Duration: <24hrs Character: worsening throughout the day, tender to touch, throbbing all the way down through leg and foot Aggravating/Associated: walking, standing, legs in dependent position/none Relieving: elevating feet, resting Treatment: none Severity: 6/10  Pt denies VB, LOF, ctx, decreased FM, vaginal discharge/odor/itching. Pt denies N/V, abdominal pain, constipation, diarrhea, or urinary problems. Pt denies fever, chills, fatigue, sweating or changes in appetite. Pt denies SOB or chest pain. Pt denies dizziness, HA, light-headedness, weakness.  Problems this pregnancy include: di/di twins. Allergies? NSAIDs, Percocet, ASA Current medications/supplements? PNVs, Prilosec, Zyrtec Prenatal care provider? [facility], next appt 09/18/2019   OB History    Gravida  3   Para  1   Term  1   Preterm      AB  1   Living  1     SAB  1   TAB      Ectopic      Multiple  0   Live Births  1           Past Medical History:  Diagnosis Date  . Anxiety   . Endometriosis   . Missed ab   . Vaginal Pap smear, abnormal   . Wears glasses     Past Surgical History:  Procedure Laterality Date  . CESAREAN SECTION N/A 10/07/2017   Procedure: CESAREAN SECTION;  Surgeon: Sanjuana Kava, MD;  Location: [facility];  Service: Obstetrics;  Laterality: N/A;  . CHROMOPERTUBATION N/A 06/26/2015   Procedure: CHROMOPERTUBATION;  Surgeon: Eldred Manges, MD;  Location: [facility];  Service: Gynecology;  Laterality: N/A;  . DILATION AND  CURETTAGE OF UTERUS    . DILATION AND EVACUATION N/A 03/27/2016   Procedure: DILATATION AND EVACUATION;  Surgeon: Eldred Manges, MD;  Location: [facility];  Service: Gynecology;  Laterality: N/A;  . LAPAROSCOPIC OVARIAN CYSTECTOMY N/A 06/26/2015   Procedure: LAPAROSCOPIC OVARIAN CYSTECTOMY;  Surgeon: Eldred Manges, MD;  Location: [facility];  Service: Gynecology;  Laterality: N/A;  . LAPAROSCOPY N/A 06/26/2015   Procedure: LAPAROSCOPY OPERATIVE with excision of fEndometriosis and ablation of endometriosis;  Surgeon: Eldred Manges, MD;  Location: [facility];  Service: Gynecology;  Laterality: N/A;  . LEEP  2015  . WISDOM TOOTH EXTRACTION      Family History  Problem Relation Age of Onset  . Colon cancer Paternal Grandfather     Social History   Tobacco Use  . Smoking status: Never Smoker  . Smokeless tobacco: Never Used  Substance Use Topics  . Alcohol use: Not Currently    Comment: occasssional  . Drug use: No    Allergies:  Allergies  Allergen Reactions  . Nsaids Hives    Aleve (naproxen), Ibuprofen, ASA  . Percocet [Oxycodone-Acetaminophen] Hives    Pt reports being able to take Tylenol #3 without adverse effects  . Asa [Aspirin] Hives    Medications Prior to Admission  Medication Sig Dispense Refill Last Dose  . acetaminophen (TYLENOL) 500 MG tablet Take  500 mg by mouth every 6 (six) hours as needed.   Past Month at Unknown time  . Cetirizine HCl (ZYRTEC PO) Take by mouth.   08/21/2019 at Unknown time  . Omeprazole (PRILOSEC PO) Take by mouth.   08/21/2019 at Unknown time  . Prenatal Vit-Fe Fumarate-FA (PRENATAL MULTIVITAMIN) TABS tablet Take 1 tablet by mouth daily at 12 noon.   08/21/2019 at Unknown time  . albuterol (PROVENTIL HFA;VENTOLIN HFA) 108 (90 Base) MCG/ACT inhaler Inhale 2 puffs into the lungs every 6 (six) hours as needed for wheezing or shortness of breath. (Patient not taking: Reported on 08/18/2019) 1 Inhaler 0 More than a month at Unknown time    . letrozole (FEMARA) 2.5 MG tablet Take 1 tablet by mouth daily.     Marland Kitchen sulfacetamide (BLEPH-10) 10 % ophthalmic solution Place 1 drop into the left eye every 3 (three) hours.       Review of Systems  Constitutional: Negative for chills, diaphoresis, fatigue and fever.  Eyes: Negative for visual disturbance.  Respiratory: Negative for shortness of breath.   Cardiovascular: Negative for chest pain.  Gastrointestinal: Negative for abdominal pain, constipation, diarrhea, nausea and vomiting.  Genitourinary: Negative for dysuria, flank pain, frequency, pelvic pain, urgency, vaginal bleeding and vaginal discharge.  Musculoskeletal:       Pain above left knee.  Neurological: Negative for dizziness, weakness, light-headedness and headaches.   Physical Exam   Blood pressure 109/70, pulse 82, temperature 98.4 F (36.9 C), temperature source Oral, resp. rate 16, height 5\' 1"  (1.549 m), weight 79.1 kg, last menstrual period 03/25/2019, SpO2 100 %, unknown if currently breastfeeding.  Patient Vitals for the past 24 hrs:  BP Temp Temp src Pulse Resp SpO2 Height Weight  08/22/19 0006 109/70 -- -- 82 -- -- -- --  08/21/19 2214 115/74 -- -- 97 -- -- -- --  08/21/19 2141 (!) 144/72 98.4 F (36.9 C) Oral 91 16 100 % 5\' 1"  (1.549 m) 79.1 kg   Physical Exam  Nursing note and vitals reviewed. Constitutional: She is oriented to person, place, and time. She appears well-developed and well-nourished. No distress.  HENT:  Head: Normocephalic and atraumatic.  Respiratory: Effort normal.  Genitourinary:    No vaginal discharge.   Musculoskeletal:        General: Tenderness present. No deformity or edema.     Left knee: No swelling, deformity, effusion, erythema, ecchymosis or lacerations. Tenderness present.       Legs:  Neurological: She is alert and oriented to person, place, and time.  Skin: Skin is warm and dry. She is not diaphoretic.  Psychiatric: She has a normal mood and affect. Her behavior  is normal. Judgment and thought content normal.   Results for orders placed or performed during the hospital encounter of 08/21/19 (from the past 24 hour(s))  Protein / creatinine ratio, urine     Status: None   Collection Time: 08/21/19 10:30 PM  Result Value Ref Range   Creatinine, Urine 26.03 mg/dL   Total Protein, Urine <6 mg/dL   Protein Creatinine Ratio        0.00 - 0.15 mg/mg[Cre]  CBC     Status: Abnormal   Collection Time: 08/21/19 11:08 PM  Result Value Ref Range   WBC 8.1 4.0 - 10.5 K/uL   RBC 3.16 (L) 3.87 - 5.11 MIL/uL   Hemoglobin 9.4 (L) 12.0 - 15.0 g/dL   HCT 28.1 (L) 36.0 - 46.0 %   MCV 88.9 80.0 - 100.0  fL   MCH 29.7 26.0 - 34.0 pg   MCHC 33.5 30.0 - 36.0 g/dL   RDW 14.0 11.5 - 15.5 %   Platelets 214 150 - 400 K/uL   nRBC 0.0 0.0 - 0.2 %  Comprehensive metabolic panel     Status: Abnormal   Collection Time: 08/21/19 11:08 PM  Result Value Ref Range   Sodium 133 (L) 135 - 145 mmol/L   Potassium 3.6 3.5 - 5.1 mmol/L   Chloride 107 98 - 111 mmol/L   CO2 22 22 - 32 mmol/L   Glucose, Bld 108 (H) 70 - 99 mg/dL   BUN 7 6 - 20 mg/dL   Creatinine, Ser 0.52 0.44 - 1.00 mg/dL   Calcium 8.8 (L) 8.9 - 10.3 mg/dL   Total Protein 5.6 (L) 6.5 - 8.1 g/dL   Albumin 2.5 (L) 3.5 - 5.0 g/dL   AST 16 15 - 41 U/L   ALT 16 0 - 44 U/L   Alkaline Phosphatase 58 38 - 126 U/L   Total Bilirubin 0.5 0.3 - 1.2 mg/dL   GFR calc non Af Amer >60 >60 mL/min   GFR calc Af Amer >60 >60 mL/min   Anion gap 4 (L) 5 - 15   MAU Course  Procedures  MDM -left knee pain since this AM without other concerns -tenderness on palpation in area noted above without redness/swelling/deformity -preeclampsia labs d/t elevated BP on admission with normal repeat -CBC: H/H 9.4/28.1 -CMP: WNL -PCr: below reportable range -consulted with Dr. Roselie Awkward re: patient presentation. Per Dr. Roselie Awkward, give dose of Lovenox 1mg /kg in case of DVT and schedule patient for outpatient DVT scan tomorrow, per protocol. -pt  discharged to home in stable condition  Orders Placed This Encounter  Procedures  . CBC    Standing Status:   Standing    Number of Occurrences:   1  . Comprehensive metabolic panel    Standing Status:   Standing    Number of Occurrences:   1  . Protein / creatinine ratio, urine    Standing Status:   Standing    Number of Occurrences:   1  . Discharge patient    Order Specific Question:   Discharge disposition    Answer:   01-Home or Self Care [1]    Order Specific Question:   Discharge patient date    Answer:   08/22/2019   Meds ordered this encounter  Medications  . enoxaparin (LOVENOX) injection 80 mg  . ferrous sulfate 325 (65 FE) MG tablet    Sig: Take 1 tablet (325 mg total) by mouth daily.    Dispense:  30 tablet    Refill:  3    Order Specific Question:   Supervising Provider    Answer:   Woodroe Mode [id number]   Assessment and Plan   1. Tenderness of left lower extremity   2. [redacted] weeks gestation of pregnancy   3. Dichorionic diamniotic twin pregnancy in second trimester   4. Anemia during pregnancy in second trimester    Allergies as of 08/22/2019      Reactions   Nsaids Hives   Aleve (naproxen), Ibuprofen, ASA   Percocet [oxycodone-acetaminophen] Hives   Pt reports being able to take Tylenol #3 without adverse effects   Asa [aspirin] Hives      Medication List    TAKE these medications   acetaminophen 500 MG tablet Commonly known as: TYLENOL Take 500 mg by mouth every 6 (six) hours as  needed.   albuterol 108 (90 Base) MCG/ACT inhaler Commonly known as: VENTOLIN HFA Inhale 2 puffs into the lungs every 6 (six) hours as needed for wheezing or shortness of breath.   ferrous sulfate 325 (65 FE) MG tablet Take 1 tablet (325 mg total) by mouth daily.   letrozole 2.5 MG tablet Commonly known as: FEMARA Take 1 tablet by mouth daily.   prenatal multivitamin Tabs tablet Take 1 tablet by mouth daily at 12 noon.   PRILOSEC PO Take by mouth.   sulfacetamide  10 % ophthalmic solution Commonly known as: BLEPH-10 Place 1 drop into the left eye every 3 (three) hours.   ZYRTEC PO Take by mouth.      -RX iron -[facility] -discussed s/sx of worsening DVT/PE and when to return to ED -strict preeclampsia/bleeding/pain/return MAU precautions g iven -pt discharged to home in stable condition  Elmyra Ricks E Yuritza Paulhus 08/22/2019, 1:21 AM

## 2019-08-22 ENCOUNTER — Ambulatory Visit (HOSPITAL_BASED_OUTPATIENT_CLINIC_OR_DEPARTMENT_OTHER)
Admission: RE | Admit: 2019-08-22 | Discharge: 2019-08-22 | Disposition: A | Discharge status: Home / Self Care | Payer: BC Managed Care – PPO | Source: Ambulatory Visit | Attending: Women's Health | Admitting: Women's Health

## 2019-08-22 DIAGNOSIS — M79605 Pain in left leg: Secondary | ICD-10-CM | POA: Diagnosis not present

## 2019-08-22 LAB — PROTEIN / CREATININE RATIO, URINE
Creatinine, Urine: 26.03 mg/dL
Total Protein, Urine: <6 mg/dL

## 2019-08-22 MED ORDER — FERROUS SULFATE 325 (65 FE) MG PO TABS: 325.0000 mg | ORAL_TABLET | Freq: Every day | ORAL | 3 refills | Status: DC

## 2019-08-22 NOTE — Progress Notes (Signed)
Left lower extremity venous duplex has been completed. Preliminary results can be found in CV Proc through chart review.   08/22/19 11:21 AM Kathryn Perry RVT

## 2019-08-22 NOTE — Discharge Instructions (Signed)
Pulmonary Embolism  A pulmonary embolism (PE) is a sudden blockage or decrease of blood flow in one or both lungs. Most blockages come from a blood clot that forms in the vein of a lower leg, thigh, or arm (deep vein thrombosis, DVT) and travels to the lungs. A clot is blood that has thickened into a gel or solid. PE is a dangerous and life-threatening condition that needs to be treated right away. What are the causes? This condition is usually caused by a blood clot that forms in a vein and moves to the lungs. In rare cases, it may be caused by air, fat, part of a tumor, or other tissue that moves through the veins and into the lungs. What increases the risk? The following factors may make you more likely to develop this condition:  Experiencing a traumatic injury, such as breaking a hip or leg.  Having: ? A spinal cord injury. ? Orthopedic surgery, especially hip or knee replacement. ? Any major surgery. ? A stroke. ? DVT. ? Blood clots or blood clotting disease. ? Long-term (chronic) lung or heart disease. ? Cancer treated with chemotherapy. ? A central venous catheter.  Taking medicines that contain estrogen. These include birth control pills and hormone replacement therapy.  Being: ? Pregnant. ? In the period of time after your baby is delivered (postpartum). ? Older than age 60. ? Overweight. ? A smoker, especially if you have other risks. What are the signs or symptoms? Symptoms of this condition usually start suddenly and include:  Shortness of breath during activity or at rest.  Coughing, coughing up blood, or coughing up blood-tinged mucus.  Chest pain that is often worse with deep breaths.  Rapid or irregular heartbeat.  Feeling light-headed or dizzy.  Fainting.  Feeling anxious.  Fever.  Sweating.  Pain and swelling in a leg. This is a symptom of DVT, which can lead to PE. How is this diagnosed? This condition may be diagnosed based on:  Your medical  history.  A physical exam.  Blood tests.  CT pulmonary angiogram. This test checks blood flow in and around your lungs.  Ventilation-perfusion scan, also called a lung VQ scan. This test measures air flow and blood flow to the lungs.  An ultrasound of the legs. How is this treated? Treatment for this condition depends on many factors, such as the cause of your PE, your risk for bleeding or developing more clots, and other medical conditions you have. Treatment aims to remove, dissolve, or stop blood clots from forming or growing larger. Treatment may include:  Medicines, such as: ? Blood thinning medicines (anticoagulants) to stop clots from forming. ? Medicines that dissolve clots (thrombolytics).  Procedures, such as: ? Using a flexible tube to remove a blood clot (embolectomy) or to deliver medicine to destroy it (catheter-directed thrombolysis). ? Inserting a filter into a large vein that carries blood to the heart (inferior vena cava). This filter (vena cava filter) catches blood clots before they reach the lungs. ? Surgery to remove the clot (surgical embolectomy). This is rare. You may need a combination of immediate, long-term (up to 3 months after diagnosis), and extended (more than 3 months after diagnosis) treatments. Your treatment may continue for several months (maintenance therapy). You and your health care provider will work together to choose the treatment program that is best for you. Follow these instructions at home: Medicines  Take over-the-counter and prescription medicines only as told by your health care provider.  If you   are taking an anticoagulant medicine: ? Take the medicine every day at the same time each day. ? Understand what foods and drugs interact with your medicine. ? Understand the side effects of this medicine, including excessive bruising or bleeding. Ask your health care provider or pharmacist about other side effects. General  instructions  Wear a medical alert bracelet or carry a medical alert card that says you have had a PE and lists what medicines you take.  Ask your health care provider when you may return to your normal activities. Avoid sitting or lying for a long time without moving.  Maintain a healthy weight. Ask your health care provider what weight is healthy for you.  Do not use any products that contain nicotine or tobacco, such as cigarettes, e-cigarettes, and chewing tobacco. If you need help quitting, ask your health care provider.  Talk with your health care provider about any travel plans. It is important to make sure that you are still able to take your medicine while on trips.  Keep all follow-up visits as told by your health care provider. This is important. Contact a health care provider if:  You missed a dose of your blood thinner medicine. Get help right away if:  You have: ? New or increased pain, swelling, warmth, or redness in an arm or leg. ? Numbness or tingling in an arm or leg. ? Shortness of breath during activity or at rest. ? A fever. ? Chest pain. ? A rapid or irregular heartbeat. ? A severe headache. ? Vision changes. ? A serious fall or accident, or you hit your head. ? Stomach (abdominal) pain. ? Blood in your vomit, stool, or urine. ? A cut that will not stop bleeding.  You cough up blood.  You feel light-headed or dizzy.  You cannot move your arms or legs.  You are confused or have memory loss. These symptoms may represent a serious problem that is an emergency. Do not wait to see if the symptoms will go away. Get medical help right away. Call your local emergency services (911 in the [place] Do not drive yourself to the hospital. Summary  A pulmonary embolism (PE) is a sudden blockage or decrease of blood flow in one or both lungs. PE is a dangerous and life-threatening condition that needs to be treated right away.  Treatments for this condition usually  include medicines to thin your blood (anticoagulants) or medicines to break apart blood clots (thrombolytics).  If you are given blood thinners, it is important to take the medicine every day at the same time each day.  Understand what foods and drugs interact with any medicines that you are taking.  If you have signs of PE or DVT, call your local emergency services (911 in the [place] This information is not intended to replace advice given to you by your health care provider. Make sure you discuss any questions you have with your health care provider. Document Revised: 04/13/2018 Document Reviewed: 04/13/2018 Elsevier Patient Education  2020 [place]. Deep Vein Thrombosis  Deep vein thrombosis (DVT) is a condition in which a blood clot forms in a deep vein, such as a lower leg, thigh, or arm vein. A clot is blood that has thickened into a gel or solid. This condition is dangerous. It can lead to serious and even life-threatening complications if the clot travels to the lungs and causes a blockage (pulmonary embolism). It can also damage veins in the leg. This can result in leg  pain, swelling, discoloration, and sores (post-thrombotic syndrome). What are the causes? This condition may be caused by:  A slowdown of blood flow.  Damage to a vein.  A condition that causes blood to clot more easily, such as an inherited clotting disorder. What increases the risk? The following factors may make you more likely to develop this condition:  Being overweight.  Being older, especially over age 39.  Sitting or lying down for more than four hours.  Being in the hospital.  Lack of physical activity (sedentary lifestyle).  Pregnancy, being in childbirth, or having recently given birth.  Taking medicines that contain estrogen, such as medicines to prevent pregnancy.  Smoking.  A history of any of the following: ? Blood clots or a blood clotting disease. ? Peripheral vascular  disease. ? Inflammatory bowel disease. ? Cancer. ? Heart disease. ? Genetic conditions that affect how your blood clots, such as Factor V Leiden mutation. ? Neurological diseases that affect your legs (leg paresis). ? A recent injury, such as a car accident. ? Major or lengthy surgery. ? A central line placed inside a large vein. What are the signs or symptoms? Symptoms of this condition include:  Swelling, pain, or tenderness in an arm or leg.  Warmth, redness, or discoloration in an arm or leg. If the clot is in your leg, symptoms may be more noticeable or worse when you stand or walk. Some people may not develop any symptoms. How is this diagnosed? This condition is diagnosed with:  A medical history and physical exam.  Tests, such as: ? Blood tests. These are done to check how well your blood clots. ? Ultrasound. This is done to check for clots. ? Venogram. For this test, contrast dye is injected into a vein and X-rays are taken to check for any clots. How is this treated? Treatment for this condition depends on:  The cause of your DVT.  Your risk for bleeding or developing more clots.  Any other medical conditions that you have. Treatment may include:  Taking a blood thinner (anticoagulant). This type of medicine prevents clots from forming. It may be taken by mouth, injected under the skin, or injected through an IV (catheter).  Injecting clot-dissolving medicines into the affected vein (catheter-directed thrombolysis).  Having surgery. Surgery may be done to: ? Remove the clot. ? Place a filter in a large vein to catch blood clots before they reach the lungs. Some treatments may be continued for up to six months. Follow these instructions at home: If you are taking blood thinners:  Take the medicine exactly as told by your health care provider. Some blood thinners need to be taken at the same time every day. Do not skip a dose.  Talk with your health care  provider before you take any medicines that contain aspirin or NSAIDs. These medicines increase your risk for dangerous bleeding.  Ask your health care provider about foods and drugs that could change the way the medicine works (may interact). Avoid those things if your health care provider tells you to do so.  Blood thinners can cause easy bruising and may make it difficult to stop bleeding. Because of this: ? Be very careful when using knives, scissors, or other sharp objects. ? Use an electric razor instead of a blade. ? Avoid activities that could cause injury or bruising, and follow instructions about how to prevent falls.  Wear a medical alert bracelet or carry a card that lists what medicines you take.  General instructions  Take over-the-counter and prescription medicines only as told by your health care provider.  Return to your normal activities as told by your health care provider. Ask your health care provider what activities are safe for you.  Wear compression stockings if recommended by your health care provider.  Keep all follow-up visits as told by your health care provider. This is important. How is this prevented? To lower your risk of developing this condition again:  For 30 or more minutes every day, do an activity that: ? Involves moving your arms and legs. ? Increases your heart rate.  When traveling for longer than four hours: ? Exercise your arms and legs every hour. ? Drink plenty of water. ? Avoid drinking alcohol.  Avoid sitting or lying for a long time without moving your legs.  If you have surgery or you are hospitalized, ask about ways to prevent blood clots. These may include taking frequent walks or using anticoagulants.  Stay at a healthy weight.  If you are a woman who is older than age 15, avoid unnecessary use of medicines that contain estrogen, such as some birth control pills.  Do not use any products that contain nicotine or tobacco, such  as cigarettes and e-cigarettes. This is especially important if you take estrogen medicines. If you need help quitting, ask your health care provider. Contact a health care provider if:  You miss a dose of your blood thinner.  Your menstrual period is heavier than usual.  You have unusual bruising. Get help right away if:  You have: ? New or increased pain, swelling, or redness in an arm or leg. ? Numbness or tingling in an arm or leg. ? Shortness of breath. ? Chest pain. ? A rapid or irregular heartbeat. ? A severe headache or confusion. ? A cut that will not stop bleeding.  There is blood in your vomit, stool, or urine.  You have a serious fall or accident, or you hit your head.  You feel light-headed or dizzy.  You cough up blood. These symptoms may represent a serious problem that is an emergency. Do not wait to see if the symptoms will go away. Get medical help right away. Call your local emergency services (911 in the [place] Do not drive yourself to the hospital. Summary  Deep vein thrombosis (DVT) is a condition in which a blood clot forms in a deep vein, such as a lower leg, thigh, or arm vein.  Symptoms can include swelling, warmth, pain, and redness in your leg or arm.  This condition may be treated with a blood thinner (anticoagulant medicine), medicine that is injected to dissolve blood clots,compression stockings, or surgery.  If you are prescribed blood thinners, take them exactly as told. This information is not intended to replace advice given to you by your health care provider. Make sure you discuss any questions you have with your health care provider. Document Revised: 06/18/2017 Document Reviewed: 12/04/2016 Elsevier Patient Education  [place].

## 2019-08-23 ENCOUNTER — Encounter (HOSPITAL_COMMUNITY): Payer: Self-pay | Admitting: Obstetrics and Gynecology

## 2019-08-25 ENCOUNTER — Telehealth: Payer: Self-pay | Admitting: Women's Health

## 2019-08-25 NOTE — Telephone Encounter (Signed)
Pt called and identified by two identifiers. Pt alerted to negative results of vascular ultrasound. Pt reports no pain today and reports that pain has moved with positioning of babies. Pt advised to discuss MAU visit and results of vascular ultrasound with OB. Pt verbalizes understanding.  Clarisa Fling, NP  11:16 AM 08/25/2019

## 2019-09-04 ENCOUNTER — Encounter (HOSPITAL_COMMUNITY): Payer: Self-pay | Admitting: Obstetrics and Gynecology

## 2019-09-15 ENCOUNTER — Other Ambulatory Visit (HOSPITAL_COMMUNITY): Payer: Self-pay | Admitting: *Deleted

## 2019-09-15 ENCOUNTER — Ambulatory Visit (HOSPITAL_COMMUNITY)
Admission: RE | Admit: 2019-09-15 | Discharge: 2019-09-15 | Disposition: A | Discharge status: Home / Self Care | Payer: BC Managed Care – PPO | Source: Ambulatory Visit | Attending: Obstetrics and Gynecology | Admitting: Obstetrics and Gynecology

## 2019-09-15 ENCOUNTER — Other Ambulatory Visit: Payer: Self-pay

## 2019-09-15 ENCOUNTER — Encounter (HOSPITAL_COMMUNITY): Payer: Self-pay

## 2019-09-15 ENCOUNTER — Ambulatory Visit (HOSPITAL_COMMUNITY): Payer: BC Managed Care – PPO | Admitting: *Deleted

## 2019-09-15 VITALS — BP 118/63 | HR 96 | Temp 98.4°F

## 2019-09-15 DIAGNOSIS — Z362 Encounter for other antenatal screening follow-up: Secondary | ICD-10-CM | POA: Diagnosis not present

## 2019-09-15 DIAGNOSIS — O30042 Twin pregnancy, dichorionic/diamniotic, second trimester: Secondary | ICD-10-CM | POA: Diagnosis not present

## 2019-09-15 DIAGNOSIS — O30043 Twin pregnancy, dichorionic/diamniotic, third trimester: Secondary | ICD-10-CM

## 2019-09-15 DIAGNOSIS — O34219 Maternal care for unspecified type scar from previous cesarean delivery: Secondary | ICD-10-CM | POA: Diagnosis not present

## 2019-09-15 DIAGNOSIS — Z3A24 24 weeks gestation of pregnancy: Secondary | ICD-10-CM

## 2019-09-15 DIAGNOSIS — O3442 Maternal care for other abnormalities of cervix, second trimester: Secondary | ICD-10-CM

## 2019-09-15 DIAGNOSIS — O09522 Supervision of elderly multigravida, second trimester: Secondary | ICD-10-CM | POA: Diagnosis not present

## 2019-10-13 ENCOUNTER — Ambulatory Visit (HOSPITAL_COMMUNITY): Payer: BC Managed Care – PPO | Admitting: *Deleted

## 2019-10-13 ENCOUNTER — Ambulatory Visit (HOSPITAL_BASED_OUTPATIENT_CLINIC_OR_DEPARTMENT_OTHER)
Admission: RE | Admit: 2019-10-13 | Discharge: 2019-10-13 | Disposition: A | Discharge status: Home / Self Care | Payer: BC Managed Care – PPO | Source: Ambulatory Visit | Attending: Obstetrics and Gynecology | Admitting: Obstetrics and Gynecology

## 2019-10-13 ENCOUNTER — Other Ambulatory Visit: Payer: Self-pay

## 2019-10-13 ENCOUNTER — Encounter (HOSPITAL_COMMUNITY): Payer: Self-pay

## 2019-10-13 ENCOUNTER — Inpatient Hospital Stay (HOSPITAL_COMMUNITY)
Admission: AD | Admit: 2019-10-13 | Discharge: 2019-10-13 | Disposition: A | Discharge status: Home / Self Care | Payer: BC Managed Care – PPO | Attending: Obstetrics and Gynecology | Admitting: Obstetrics and Gynecology

## 2019-10-13 ENCOUNTER — Other Ambulatory Visit (HOSPITAL_COMMUNITY): Payer: Self-pay | Admitting: *Deleted

## 2019-10-13 ENCOUNTER — Encounter (HOSPITAL_COMMUNITY): Payer: Self-pay | Admitting: Obstetrics and Gynecology

## 2019-10-13 VITALS — BP 131/72 | HR 102 | Temp 97.2°F

## 2019-10-13 DIAGNOSIS — O30049 Twin pregnancy, dichorionic/diamniotic, unspecified trimester: Secondary | ICD-10-CM

## 2019-10-13 DIAGNOSIS — O30043 Twin pregnancy, dichorionic/diamniotic, third trimester: Secondary | ICD-10-CM

## 2019-10-13 DIAGNOSIS — R8789 Other abnormal findings in specimens from female genital organs: Secondary | ICD-10-CM | POA: Diagnosis not present

## 2019-10-13 DIAGNOSIS — O321XX1 Maternal care for breech presentation, fetus 1: Secondary | ICD-10-CM

## 2019-10-13 DIAGNOSIS — Z362 Encounter for other antenatal screening follow-up: Secondary | ICD-10-CM | POA: Diagnosis not present

## 2019-10-13 DIAGNOSIS — O09523 Supervision of elderly multigravida, third trimester: Secondary | ICD-10-CM | POA: Diagnosis not present

## 2019-10-13 DIAGNOSIS — Z886 Allergy status to analgesic agent status: Secondary | ICD-10-CM | POA: Insufficient documentation

## 2019-10-13 DIAGNOSIS — Z3A28 28 weeks gestation of pregnancy: Secondary | ICD-10-CM | POA: Diagnosis not present

## 2019-10-13 DIAGNOSIS — Z885 Allergy status to narcotic agent status: Secondary | ICD-10-CM | POA: Diagnosis not present

## 2019-10-13 DIAGNOSIS — O34219 Maternal care for unspecified type scar from previous cesarean delivery: Secondary | ICD-10-CM | POA: Diagnosis not present

## 2019-10-13 DIAGNOSIS — O09899 Supervision of other high risk pregnancies, unspecified trimester: Secondary | ICD-10-CM

## 2019-10-13 DIAGNOSIS — O3443 Maternal care for other abnormalities of cervix, third trimester: Secondary | ICD-10-CM | POA: Diagnosis not present

## 2019-10-13 DIAGNOSIS — R109 Unspecified abdominal pain: Secondary | ICD-10-CM | POA: Diagnosis not present

## 2019-10-13 DIAGNOSIS — O26893 Other specified pregnancy related conditions, third trimester: Secondary | ICD-10-CM | POA: Diagnosis present

## 2019-10-13 DIAGNOSIS — Z3689 Encounter for other specified antenatal screening: Secondary | ICD-10-CM

## 2019-10-13 DIAGNOSIS — O321XX2 Maternal care for breech presentation, fetus 2: Secondary | ICD-10-CM

## 2019-10-13 LAB — URINALYSIS, ROUTINE W REFLEX MICROSCOPIC
Bilirubin Urine: NEGATIVE
Glucose, UA: NEGATIVE mg/dL
Hgb urine dipstick: NEGATIVE
Ketones, ur: NEGATIVE mg/dL
Leukocytes,Ua: NEGATIVE
Nitrite: NEGATIVE
Protein, ur: NEGATIVE mg/dL
Specific Gravity, Urine: 1.008 (ref 1.005–1.030)
pH: 6 (ref 5.0–8.0)

## 2019-10-13 MED ORDER — BETAMETHASONE SOD PHOS & ACET 6 (3-3) MG/ML IJ SUSP
12.0000 mg | Freq: Once | INTRAMUSCULAR | Status: AC
  Administered 2019-10-13: 12 mg via INTRAMUSCULAR
  Filled 2019-10-13: qty 5

## 2019-10-13 NOTE — MAU Provider Note (Signed)
History     CSN: [id number]  Arrival date and time: 10/13/19 [id number]   First Provider Initiated Contact with Patient 10/13/19 2013      Chief Complaint  Patient presents with  . Fetal Monitoring   Kathryn Perry is a 34 y.o. G3P1011 at [redacted]w[redacted]d who receives care at [facility].  She presents today for Fetal Monitoring.  Patient reports she has been having Juul discharge for the past couple of days.  She states she was seen at [place] this morning and a fFN was taken due to the [place] discharge.  She then went to MFM and was told that everything looked good.  She states she was called and informed that the test returned positive and wanted to come to MAU for monitoring.  She endorses some abdominal cramping/contractions in the form of tightening.  She states she has experienced this discomfort since arrival and reports it's been "at least 5 or 6 times." She rates the pain a 4/10 and states it is more uncomfortable than painful.  Patient endorses fetal movement x 2.  She denies vaginal bleeding or leaking.  She denies sexual activity in the past 3 days.      OB History    Gravida  3   Para  1   Term  1   Preterm      AB  1   Living  1     SAB  1   TAB      Ectopic      Multiple  0   Live Births  1           Past Medical History:  Diagnosis Date  . Anxiety   . Endometriosis   . Missed ab   . Vaginal Pap smear, abnormal   . Wears glasses     Past Surgical History:  Procedure Laterality Date  . CESAREAN SECTION N/A 10/07/2017   Procedure: CESAREAN SECTION;  Surgeon: Sanjuana Kava, MD;  Location: [facility];  Service: Obstetrics;  Laterality: N/A;  . CHROMOPERTUBATION N/A 06/26/2015   Procedure: CHROMOPERTUBATION;  Surgeon: Eldred Manges, MD;  Location: [facility];  Service: Gynecology;  Laterality: N/A;  . DILATION AND CURETTAGE OF UTERUS    . DILATION AND EVACUATION N/A 03/27/2016   Procedure: DILATATION AND EVACUATION;  Surgeon: Eldred Manges, MD;  Location: [facility];  Service: Gynecology;  Laterality: N/A;  . LAPAROSCOPIC OVARIAN CYSTECTOMY N/A 06/26/2015   Procedure: LAPAROSCOPIC OVARIAN CYSTECTOMY;  Surgeon: Eldred Manges, MD;  Location: [facility];  Service: Gynecology;  Laterality: N/A;  . LAPAROSCOPY N/A 06/26/2015   Procedure: LAPAROSCOPY OPERATIVE with excision of fEndometriosis and ablation of endometriosis;  Surgeon: Eldred Manges, MD;  Location: [facility];  Service: Gynecology;  Laterality: N/A;  . LEEP  2015  . WISDOM TOOTH EXTRACTION      Family History  Problem Relation Age of Onset  . Colon cancer Paternal Grandfather     Social History   Tobacco Use  . Smoking status: Never Smoker  . Smokeless tobacco: Never Used  Substance Use Topics  . Alcohol use: Not Currently    Comment: occasssional  . Drug use: No    Allergies:  Allergies  Allergen Reactions  . Nsaids Hives    Aleve (naproxen), Ibuprofen, ASA  . Percocet [Oxycodone-Acetaminophen] Hives    Pt reports being able to take Tylenol #3 without adverse effects  . Asa [Aspirin] Hives    Medications Prior to Admission  Medication  Sig Dispense Refill Last Dose  . Cetirizine HCl (ZYRTEC PO) Take by mouth.   10/13/2019 at Unknown time  . ferrous sulfate 325 (65 FE) MG tablet Take 1 tablet (325 mg total) by mouth daily. 30 tablet 3 10/13/2019 at Unknown time  . Omeprazole (PRILOSEC PO) Take by mouth.   10/13/2019 at Unknown time  . Prenatal Vit-Fe Fumarate-FA (PRENATAL MULTIVITAMIN) TABS tablet Take 1 tablet by mouth daily at 12 noon.   10/13/2019 at Unknown time  . acetaminophen (TYLENOL) 500 MG tablet Take 500 mg by mouth every 6 (six) hours as needed.   Unknown at Unknown time  . albuterol (PROVENTIL HFA;VENTOLIN HFA) 108 (90 Base) MCG/ACT inhaler Inhale 2 puffs into the lungs every 6 (six) hours as needed for wheezing or shortness of breath. (Patient not taking: Reported on 08/18/2019) 1 Inhaler 0 Unknown at Unknown time  . letrozole (FEMARA) 2.5 MG tablet  Take 1 tablet by mouth daily.   Unknown at Unknown time  . sulfacetamide (BLEPH-10) 10 % ophthalmic solution Place 1 drop into the left eye every 3 (three) hours.   Unknown at Unknown time    Review of Systems  Constitutional: Negative for chills and fever.  Respiratory: Negative for cough and shortness of breath.   Gastrointestinal: Positive for abdominal pain (Tightening, that is uncomfortable. ). Negative for constipation, diarrhea, nausea and vomiting.  Genitourinary: Positive for pelvic pain (With standing or walking for extended periods. ). Negative for difficulty urinating and dysuria.  Musculoskeletal: Positive for back pain.  Neurological: Negative for dizziness, light-headedness and headaches.   Physical Exam   Blood pressure 124/72, pulse 100, temperature 98.3 F (36.8 C), temperature source Oral, resp. rate 17, weight 82.6 kg, last menstrual period 03/25/2019, unknown if currently breastfeeding.  Physical Exam  Constitutional: She is oriented to person, place, and time. She appears well-developed and well-nourished.  HENT:  Head: Normocephalic and atraumatic.  Eyes: Conjunctivae are normal.  Cardiovascular: Normal rate, regular rhythm and normal heart sounds.  Respiratory: Effort normal and breath sounds normal.  GI: Soft. Bowel sounds are normal.  Genitourinary: There is no tenderness or lesion on the right labia. There is no tenderness or lesion on the left labia. Cervix exhibits no motion tenderness.    Vaginal discharge (Small amt [place] discharge.) present.     Genitourinary Comments: Speculum Exam: -Normal External Genitalia: Non tender, no apparent discharge at introitus.  -Vaginal Vault: Pink mucosa with good rugae. Small to moderate amt light [place] discharge.  Removed with faux swab x 3 -Cervix: Not visualized d/t position -Bimanual Exam:  Cervix closed/Length not measurable manually.  Early [place] 3.11cm   Musculoskeletal:        General: Normal range of motion.      Cervical back: Normal range of motion.  Neurological: She is alert and oriented to person, place, and time.  Skin: Skin is warm and dry.  Psychiatric: She has a normal mood and affect. Her behavior is normal.    145 bpm, Mod Var, -Decels, +Accels 140 bpm, Mod Var, -Decels, +Accels Toco: Mild irritability, 3 ctx  MAU Course  Procedures No results found for this or any previous visit (from the past 24 hour(s)).  MDM Monitoring Steroid Dosing Assessment and Plan  34 year old, G3P1011  TIUP at 28.6weeks Cat I FT x 2 +fFN  -Patient case reviewed with Dr. Redmond School prior to provider at bedside.  Advised: *Give Betamethasone now and repeat on Sunday at 8am. *Monitor for 20-30 min and  assess ctx pattern. -Reviewed POC with patient. -Patient informed that fFN could of been a false positive given Sandra discharge that could of been indicative of old blood. -Patient informed of BMZ dosing and monitoring. -Informed that if no change in ctx pattern would discharge to home with follow up as appropriate. -Patient reports next appt is scheduled for the 7th of April -NST reassuring  -Okay to discontinue EFM and continue TOCO. -Will monitor and reassess as discussed.    Maryann Conners 10/13/2019, 8:14 PM   Reassessment (9:27 PM)  -Patient reports that tightening has improved with position change to side lying. -Review of toco shows occassional ctx with mild irritability.  -Pelvic exam performed and findings discussed.  -Informed that cervix is closed, but length could not be assessed manually.  Patient states this is similar to office visit and notes that this is contributed to h/o uterine position and Leep Procedure.  -Encouraged rest at home and patient reports she has good support. -Discussed that she does not need to be OOW as she works primarily from home.  - Patient declines treatment for the tightening and is reassured with pelvic exam findings stating that it is no change from  earlier when in the office.  -Extensive discussion regarding when to call and/or return to MAU for s/s of PTL. -Reviewed need to return to MAU on Sunday March 28th at 0800 for 2nd dose BMZ. -Bleeding Precautions given. -Encouraged to call primary ob or return to MAU if symptoms worsen or with the onset of new symptoms. -Discharged to home in stable condition.  Maryann Conners MSN, CNM Advanced Practice Provider, [facility]

## 2019-10-13 NOTE — Discharge Instructions (Signed)
Abdominal Pain During Pregnancy  Abdominal pain is common during pregnancy, and has many possible causes. Some causes are more serious than others, and sometimes the cause is not known. Abdominal pain can be a sign that labor is starting. It can also be caused by normal growth and stretching of muscles and ligaments during pregnancy. Always tell your health care provider if you have any abdominal pain. Follow these instructions at home:  Do not have sex or put anything in your vagina until your pain goes away completely.  Get plenty of rest until your pain improves.  Drink enough fluid to keep your urine pale yellow.  Take over-the-counter and prescription medicines only as told by your health care provider.  Keep all follow-up visits as told by your health care provider. This is important. Contact a health care provider if:  Your pain continues or gets worse after resting.  You have lower abdominal pain that: ? Comes and goes at regular intervals. ? Spreads to your back. ? Is similar to menstrual cramps.  You have pain or burning when you urinate. Get help right away if:  You have a fever or chills.  You have vaginal bleeding.  You are leaking fluid from your vagina.  You are passing tissue from your vagina.  You have vomiting or diarrhea that lasts for more than 24 hours.  Your baby is moving less than usual.  You feel very weak or faint.  You have shortness of breath.  You develop severe pain in your upper abdomen. Summary  Abdominal pain is common during pregnancy, and has many possible causes.  If you experience abdominal pain during pregnancy, tell your health care provider right away.  Follow your health care provider's home care instructions and keep all follow-up visits as directed. This information is not intended to replace advice given to you by your health care provider. Make sure you discuss any questions you have with your health care  provider. Document Revised: 10/24/2018 Document Reviewed: 10/08/2016 Elsevier Patient Education  2020 [employer]  

## 2019-10-13 NOTE — MAU Note (Signed)
Pt reports to MAU stating she is here for Fetal Monitoring. Pt was sent from [place] because she had a +FFN. Pt states she also had a Korea today and showed a good cervical length but when she got her cervix checked it felt soft. Pt reports some ctxs that worsen in the evening that can be as close as 5-10 min. Pt reports [place] discharge the last few days. No bleeding or LOF. +FM of DIDI twins.

## 2019-10-15 ENCOUNTER — Encounter (HOSPITAL_COMMUNITY): Payer: Self-pay | Admitting: Obstetrics and Gynecology

## 2019-10-15 ENCOUNTER — Inpatient Hospital Stay (HOSPITAL_COMMUNITY)
Admission: AD | Admit: 2019-10-15 | Discharge: 2019-10-15 | Disposition: A | Discharge status: Home / Self Care | Payer: BC Managed Care – PPO | Attending: Obstetrics and Gynecology | Admitting: Obstetrics and Gynecology

## 2019-10-15 ENCOUNTER — Other Ambulatory Visit: Payer: Self-pay

## 2019-10-15 DIAGNOSIS — O368190 Decreased fetal movements, unspecified trimester, not applicable or unspecified: Secondary | ICD-10-CM | POA: Diagnosis not present

## 2019-10-15 DIAGNOSIS — O26893 Other specified pregnancy related conditions, third trimester: Secondary | ICD-10-CM | POA: Insufficient documentation

## 2019-10-15 DIAGNOSIS — Z79899 Other long term (current) drug therapy: Secondary | ICD-10-CM | POA: Insufficient documentation

## 2019-10-15 DIAGNOSIS — R109 Unspecified abdominal pain: Secondary | ICD-10-CM | POA: Diagnosis not present

## 2019-10-15 DIAGNOSIS — O4703 False labor before 37 completed weeks of gestation, third trimester: Secondary | ICD-10-CM | POA: Diagnosis not present

## 2019-10-15 DIAGNOSIS — Z3A30 30 weeks gestation of pregnancy: Secondary | ICD-10-CM | POA: Diagnosis not present

## 2019-10-15 DIAGNOSIS — Z3A29 29 weeks gestation of pregnancy: Secondary | ICD-10-CM

## 2019-10-15 LAB — URINALYSIS, ROUTINE W REFLEX MICROSCOPIC
Bilirubin Urine: NEGATIVE
Glucose, UA: NEGATIVE mg/dL
Hgb urine dipstick: NEGATIVE
Ketones, ur: NEGATIVE mg/dL
Leukocytes,Ua: NEGATIVE
Nitrite: NEGATIVE
Protein, ur: NEGATIVE mg/dL
Specific Gravity, Urine: 1.004 — ABNORMAL LOW (ref 1.005–1.030)
pH: 7 (ref 5.0–8.0)

## 2019-10-15 MED ORDER — LACTATED RINGERS IV BOLUS
1000.0000 mL | Freq: Once | INTRAVENOUS | Status: AC
  Administered 2019-10-15: 1000 mL via INTRAVENOUS

## 2019-10-15 MED ORDER — BETAMETHASONE SOD PHOS & ACET 6 (3-3) MG/ML IJ SUSP
12.0000 mg | Freq: Once | INTRAMUSCULAR | Status: AC
  Administered 2019-10-15: 12 mg via INTRAMUSCULAR

## 2019-10-15 MED ORDER — NIFEDIPINE 10 MG PO CAPS: 10.0000 mg | ORAL_CAPSULE | ORAL | 0 refills | Status: DC | PRN

## 2019-10-15 MED ORDER — NIFEDIPINE 10 MG PO CAPS
10.0000 mg | ORAL_CAPSULE | ORAL | Status: DC | PRN
  Administered 2019-10-15 (×2): 10 mg via ORAL
  Filled 2019-10-15 (×2): qty 1

## 2019-10-15 NOTE — MAU Note (Signed)
Kathryn Perry is a 34 y.o. at [redacted]w[redacted]d here in MAU reporting: here for 2nd Madrone. Pt reports that contractions have persisted especially when she is up moving. they come about every 5-10 minutes when she is up moving, when resting they occur 1 time per hour. Contractions are more painful. Still having some Shoff spotting, no LOF. Reporting DFM, has felt some but less than normal.  Onset of complaint: ongoing  Pain score: 5/10  Vitals:   10/15/19 0832  BP: 123/68  Pulse: 97  Resp: 16  Temp: 98.3 F (36.8 C)  SpO2: 99%     [id number] A 141 baby B 132  Lab orders placed from triage: UA

## 2019-10-15 NOTE — Discharge Instructions (Signed)

## 2019-10-15 NOTE — MAU Provider Note (Signed)
Patient Kathryn Perry is a 34 y.o. G3P1011  at Di-Di twins at [redacted]w[redacted]d here with complaints of decreased fetal movements in Baby B since yesterday morning and increase in contractions. She denies LOF, blurry vision, floating spots. She denies fever, SOB, muscle aches, cough, dysuria. She denies recent intercourse.   She had a previous C/Section in 2019 due to failure to progress in Labor.  History     CSN: [id number]  Arrival date and time: 10/15/19 [facility]   None     Chief Complaint  Patient presents with  . Injections  . Contractions  . Decreased Fetal Movement   Abdominal Pain This is a chronic problem. The problem occurs intermittently. The pain is at a severity of 5/10. The quality of the pain is cramping. The abdominal pain radiates to the back. The pain is aggravated by movement. The pain is relieved by being still. Her past medical history is significant for abdominal surgery. previous c-section    OB History    Gravida  3   Para  1   Term  1   Preterm      AB  1   Living  1     SAB  1   TAB      Ectopic      Multiple  0   Live Births  1           Past Medical History:  Diagnosis Date  . Anxiety   . Endometriosis   . Missed ab   . Vaginal Pap smear, abnormal   . Wears glasses     Past Surgical History:  Procedure Laterality Date  . CESAREAN SECTION N/A 10/07/2017   Procedure: CESAREAN SECTION;  Surgeon: Sanjuana Kava, MD;  Location: [place];  Service: Obstetrics;  Laterality: N/A;  . CHROMOPERTUBATION N/A 06/26/2015   Procedure: CHROMOPERTUBATION;  Surgeon: Eldred Manges, MD;  Location: [facility];  Service: Gynecology;  Laterality: N/A;  . DILATION AND CURETTAGE OF UTERUS    . DILATION AND EVACUATION N/A 03/27/2016   Procedure: DILATATION AND EVACUATION;  Surgeon: Eldred Manges, MD;  Location: [place];  Service: Gynecology;  Laterality: N/A;  . LAPAROSCOPIC OVARIAN CYSTECTOMY N/A 06/26/2015   Procedure: LAPAROSCOPIC  OVARIAN CYSTECTOMY;  Surgeon: Eldred Manges, MD;  Location: [facility];  Service: Gynecology;  Laterality: N/A;  . LAPAROSCOPY N/A 06/26/2015   Procedure: LAPAROSCOPY OPERATIVE with excision of fEndometriosis and ablation of endometriosis;  Surgeon: Eldred Manges, MD;  Location: [facility];  Service: Gynecology;  Laterality: N/A;  . LEEP  2015  . WISDOM TOOTH EXTRACTION      Family History  Problem Relation Age of Onset  . Colon cancer Paternal Grandfather     Social History   Tobacco Use  . Smoking status: Never Smoker  . Smokeless tobacco: Never Used  Substance Use Topics  . Alcohol use: Not Currently    Comment: occasssional  . Drug use: No    Allergies:  Allergies  Allergen Reactions  . Nsaids Hives    Aleve (naproxen), Ibuprofen, ASA  . Percocet [Oxycodone-Acetaminophen] Hives    Pt reports being able to take Tylenol #3 without adverse effects  . Asa [Aspirin] Hives    Medications Prior to Admission  Medication Sig Dispense Refill Last Dose  . acetaminophen (TYLENOL) 500 MG tablet Take 500 mg by mouth every 6 (six) hours as needed.     Marland Kitchen albuterol (PROVENTIL HFA;VENTOLIN HFA) 108 (90 Base) MCG/ACT inhaler Inhale  2 puffs into the lungs every 6 (six) hours as needed for wheezing or shortness of breath. (Patient not taking: Reported on 08/18/2019) 1 Inhaler 0   . Cetirizine HCl (ZYRTEC PO) Take by mouth.     . ferrous sulfate 325 (65 FE) MG tablet Take 1 tablet (325 mg total) by mouth daily. 30 tablet 3   . Omeprazole (PRILOSEC PO) Take by mouth.     . Prenatal Vit-Fe Fumarate-FA (PRENATAL MULTIVITAMIN) TABS tablet Take 1 tablet by mouth daily at 12 noon.     . sulfacetamide (BLEPH-10) 10 % ophthalmic solution Place 1 drop into the left eye every 3 (three) hours.       Review of Systems  HENT: Negative.   Respiratory: Negative.   Gastrointestinal: Positive for abdominal pain.  Genitourinary: Negative.   Allergic/Immunologic: Negative.   Psychiatric/Behavioral:  Negative.    Physical Exam   Blood pressure 123/68, pulse 97, temperature 98.3 F (36.8 C), temperature source Oral, resp. rate 16, last menstrual period 03/25/2019, SpO2 99 %, unknown if currently breastfeeding.  Physical Exam  Constitutional: She appears well-developed.  HENT:  Head: Normocephalic.  Respiratory: Effort normal.  GI: Soft.  Genitourinary:    Genitourinary Comments: NEFG; cervix is closed, no CMT, suprapubic or adnexal tenderness.    Musculoskeletal:        General: Normal range of motion.     Cervical back: Normal range of motion.  Neurological: She is alert.  Skin: Skin is warm and dry.  Psychiatric: She has a normal mood and affect.    MAU Course  Procedures  MDM IV LR with 2 doses of procardia; will reassess cervix in one hour.  -received 2nd dose of BMZ while in MAU.   Reassessment at 1120: Patient reports that she still feels some contractions but less. Cervix rechecked; still closed.   NST: [name]: 150 bpm, mod var, no decels, present acels Baby: 155: mod var, no decels, present acels. Uterine irratability 1140: patient has made no cervical change and feels strong fetal movements; low index of suspicion for preterm delivery at this point.   Assessment and Plan   1. Threatened preterm labor, third trimester    2. Patient stable for discharge with RX for procardia q 4 hours for contractions. Reviewed strict warning signs and when to return to MAU; patient verbalized understanding.   3. Keep appt at [place] on 4-7; MAU note routed to [place].   Mervyn Skeeters Redonna Wilbert 10/15/2019, 9:15 AM

## 2019-10-18 ENCOUNTER — Encounter (HOSPITAL_COMMUNITY): Payer: Self-pay | Admitting: Obstetrics and Gynecology

## 2019-10-18 ENCOUNTER — Other Ambulatory Visit: Payer: Self-pay

## 2019-10-18 ENCOUNTER — Inpatient Hospital Stay (HOSPITAL_COMMUNITY)
Admission: AD | Admit: 2019-10-18 | Discharge: 2019-11-23 | DRG: 786 | Disposition: A | Discharge status: Home / Self Care | Payer: BC Managed Care – PPO | Attending: Obstetrics & Gynecology | Admitting: Obstetrics & Gynecology

## 2019-10-18 ENCOUNTER — Inpatient Hospital Stay (HOSPITAL_BASED_OUTPATIENT_CLINIC_OR_DEPARTMENT_OTHER): Payer: BC Managed Care – PPO

## 2019-10-18 DIAGNOSIS — N939 Abnormal uterine and vaginal bleeding, unspecified: Secondary | ICD-10-CM | POA: Diagnosis present

## 2019-10-18 DIAGNOSIS — O34211 Maternal care for low transverse scar from previous cesarean delivery: Secondary | ICD-10-CM | POA: Diagnosis present

## 2019-10-18 DIAGNOSIS — O24419 Gestational diabetes mellitus in pregnancy, unspecified control: Secondary | ICD-10-CM

## 2019-10-18 DIAGNOSIS — O09523 Supervision of elderly multigravida, third trimester: Secondary | ICD-10-CM | POA: Diagnosis not present

## 2019-10-18 DIAGNOSIS — O321XX2 Maternal care for breech presentation, fetus 2: Secondary | ICD-10-CM

## 2019-10-18 DIAGNOSIS — O30043 Twin pregnancy, dichorionic/diamniotic, third trimester: Secondary | ICD-10-CM

## 2019-10-18 DIAGNOSIS — K449 Diaphragmatic hernia without obstruction or gangrene: Secondary | ICD-10-CM | POA: Diagnosis present

## 2019-10-18 DIAGNOSIS — O321XX1 Maternal care for breech presentation, fetus 1: Secondary | ICD-10-CM | POA: Diagnosis not present

## 2019-10-18 DIAGNOSIS — O4690 Antepartum hemorrhage, unspecified, unspecified trimester: Secondary | ICD-10-CM

## 2019-10-18 DIAGNOSIS — D62 Acute posthemorrhagic anemia: Secondary | ICD-10-CM | POA: Diagnosis present

## 2019-10-18 DIAGNOSIS — O3413 Maternal care for benign tumor of corpus uteri, third trimester: Secondary | ICD-10-CM | POA: Diagnosis present

## 2019-10-18 DIAGNOSIS — Z3A29 29 weeks gestation of pregnancy: Secondary | ICD-10-CM

## 2019-10-18 DIAGNOSIS — R001 Bradycardia, unspecified: Secondary | ICD-10-CM | POA: Diagnosis not present

## 2019-10-18 DIAGNOSIS — O30003 Twin pregnancy, unspecified number of placenta and unspecified number of amniotic sacs, third trimester: Secondary | ICD-10-CM | POA: Diagnosis present

## 2019-10-18 DIAGNOSIS — O24420 Gestational diabetes mellitus in childbirth, diet controlled: Secondary | ICD-10-CM | POA: Diagnosis present

## 2019-10-18 DIAGNOSIS — O30009 Twin pregnancy, unspecified number of placenta and unspecified number of amniotic sacs, unspecified trimester: Secondary | ICD-10-CM

## 2019-10-18 DIAGNOSIS — O328XX2 Maternal care for other malpresentation of fetus, fetus 2: Secondary | ICD-10-CM | POA: Diagnosis present

## 2019-10-18 DIAGNOSIS — O9902 Anemia complicating childbirth: Secondary | ICD-10-CM | POA: Diagnosis present

## 2019-10-18 DIAGNOSIS — O4593 Premature separation of placenta, unspecified, third trimester: Principal | ICD-10-CM | POA: Diagnosis present

## 2019-10-18 DIAGNOSIS — O4693 Antepartum hemorrhage, unspecified, third trimester: Secondary | ICD-10-CM

## 2019-10-18 DIAGNOSIS — O365990 Maternal care for other known or suspected poor fetal growth, unspecified trimester, not applicable or unspecified: Secondary | ICD-10-CM

## 2019-10-18 DIAGNOSIS — R079 Chest pain, unspecified: Secondary | ICD-10-CM

## 2019-10-18 DIAGNOSIS — K219 Gastro-esophageal reflux disease without esophagitis: Secondary | ICD-10-CM | POA: Diagnosis present

## 2019-10-18 DIAGNOSIS — R0789 Other chest pain: Secondary | ICD-10-CM

## 2019-10-18 DIAGNOSIS — D259 Leiomyoma of uterus, unspecified: Secondary | ICD-10-CM | POA: Diagnosis present

## 2019-10-18 DIAGNOSIS — O99892 Other specified diseases and conditions complicating childbirth: Secondary | ICD-10-CM | POA: Diagnosis not present

## 2019-10-18 DIAGNOSIS — Z20822 Contact with and (suspected) exposure to covid-19: Secondary | ICD-10-CM | POA: Diagnosis present

## 2019-10-18 DIAGNOSIS — O365931 Maternal care for other known or suspected poor fetal growth, third trimester, fetus 1: Secondary | ICD-10-CM

## 2019-10-18 DIAGNOSIS — O9962 Diseases of the digestive system complicating childbirth: Secondary | ICD-10-CM | POA: Diagnosis present

## 2019-10-18 LAB — URINALYSIS, ROUTINE W REFLEX MICROSCOPIC
Bilirubin Urine: NEGATIVE
Glucose, UA: 50 mg/dL — AB
Ketones, ur: NEGATIVE mg/dL
Leukocytes,Ua: NEGATIVE
Nitrite: NEGATIVE
Protein, ur: NEGATIVE mg/dL
Specific Gravity, Urine: 1.004 — ABNORMAL LOW (ref 1.005–1.030)
pH: 7 (ref 5.0–8.0)

## 2019-10-18 MED ORDER — PRENATAL MULTIVITAMIN CH
1.0000 | ORAL_TABLET | Freq: Every day | ORAL | Status: DC
  Administered 2019-10-20 – 2019-11-19 (×31): 1 via ORAL
  Filled 2019-10-18 (×30): qty 1

## 2019-10-18 MED ORDER — LACTATED RINGERS IV SOLN: INTRAVENOUS | Status: DC

## 2019-10-18 MED ORDER — CALCIUM CARBONATE ANTACID 500 MG PO CHEW
2.0000 | CHEWABLE_TABLET | ORAL | Status: DC | PRN
  Administered 2019-10-20: 400 mg via ORAL
  Filled 2019-10-18: qty 2

## 2019-10-18 MED ORDER — ZOLPIDEM TARTRATE 5 MG PO TABS: 5.0000 mg | ORAL_TABLET | Freq: Every evening | ORAL | Status: DC | PRN

## 2019-10-18 MED ORDER — DOCUSATE SODIUM 100 MG PO CAPS
100.0000 mg | ORAL_CAPSULE | Freq: Every day | ORAL | Status: DC
  Administered 2019-10-20 – 2019-11-23 (×33): 100 mg via ORAL
  Filled 2019-10-18 (×34): qty 1

## 2019-10-18 NOTE — MAU Note (Signed)
Pt reports to MAU c/o vaginal bleeding that started around 2000. Pt states its about the amount of blood you would see on the first day of your period. Pt reports cramping. Pt took procardia around 1900 for cramping. No LOF. +FM.

## 2019-10-18 NOTE — MAU Note (Signed)
Covid swab obtained without difficulty and pt tol well. No symptoms 

## 2019-10-18 NOTE — MAU Provider Note (Signed)
History     CSN: [id number]  Arrival date and time: 10/18/19 2050   First Provider Initiated Contact with Patient 10/18/19 2158      Chief Complaint  Patient presents with  . Contractions  . Vaginal Bleeding   Kathryn Perry is a 34 y.o. G3P1011 at [redacted]w[redacted]d who receives care at [facility].  She presents today for Contractions and Vaginal Bleeding.  She states she noted blood, around 8pm, that was heavy like "a period on your first day."  She states she has had some cramping prior to this incident that "feels like period cramping." She distinguishes that this cramping is not the same as the contractions she has been experiencing. She reports that she feels the cramping in her lower abdomen, while the contractions present in her upper abdominal area.  She rates the cramping a 4/10. She states that she took a procardia at 730pm for her contractions.  She states the contractions are about the same as from previous visits, but make her "feel just a little bit more uncomfortable than this weekend."  Patient endorses fetal movement x 2. Patient states she did not have anymore incidents of the brownish discharge since Friday.       OB History    Gravida  3   Para  1   Term  1   Preterm      AB  1   Living  1     SAB  1   TAB      Ectopic      Multiple  0   Live Births  1           Past Medical History:  Diagnosis Date  . Anxiety   . Endometriosis   . Missed ab   . Vaginal Pap smear, abnormal   . Wears glasses     Past Surgical History:  Procedure Laterality Date  . CESAREAN SECTION N/A 10/07/2017   Procedure: CESAREAN SECTION;  Surgeon: Sanjuana Kava, MD;  Location: [place];  Service: Obstetrics;  Laterality: N/A;  . CHROMOPERTUBATION N/A 06/26/2015   Procedure: CHROMOPERTUBATION;  Surgeon: Eldred Manges, MD;  Location: [facility];  Service: Gynecology;  Laterality: N/A;  . DILATION AND CURETTAGE OF UTERUS    . DILATION AND EVACUATION N/A 03/27/2016   Procedure:  DILATATION AND EVACUATION;  Surgeon: Eldred Manges, MD;  [facility];  Service: Gynecology;  Laterality: N/A;  . LAPAROSCOPIC OVARIAN CYSTECTOMY N/A 06/26/2015   Procedure: LAPAROSCOPIC OVARIAN CYSTECTOMY;  Surgeon: Eldred Manges, MD;  Location: [place] [facility];  Service: Gynecology;  Laterality: N/A;  . LAPAROSCOPY N/A 06/26/2015   Procedure: LAPAROSCOPY OPERATIVE with excision of fEndometriosis and ablation of endometriosis;  Surgeon: Eldred Manges, MD;  Location: [place] [facility];  Service: Gynecology;  Laterality: N/A;  . LEEP  2015  . WISDOM TOOTH EXTRACTION      Family History  Problem Relation Age of Onset  . Colon cancer Paternal Grandfather     Social History   Tobacco Use  . Smoking status: Never Smoker  . Smokeless tobacco: Never Used  Substance Use Topics  . Alcohol use: Not Currently    Comment: occasssional  . Drug use: No    Allergies:  Allergies  Allergen Reactions  . Nsaids Hives    Aleve (naproxen), Ibuprofen, ASA  . Percocet [Oxycodone-Acetaminophen] Hives    Pt reports being able to take Tylenol #3 without adverse effects  . Asa [Aspirin] Hives    Medications  Prior to Admission  Medication Sig Dispense Refill Last Dose  . Cetirizine HCl (ZYRTEC PO) Take by mouth.   10/17/2019 at Unknown time  . ferrous sulfate 325 (65 FE) MG tablet Take 1 tablet (325 mg total) by mouth daily. 30 tablet 3 10/18/2019 at Unknown time  . NIFEdipine (PROCARDIA) 10 MG capsule Take 1 capsule (10 mg total) by mouth every 4 (four) hours as needed. 60 capsule 0 10/18/2019 at Unknown time  . Omeprazole (PRILOSEC PO) Take by mouth.   10/17/2019 at Unknown time  . Prenatal Vit-Fe Fumarate-FA (PRENATAL MULTIVITAMIN) TABS tablet Take 1 tablet by mouth daily at 12 noon.   10/17/2019 at Unknown time  . acetaminophen (TYLENOL) 500 MG tablet Take 500 mg by mouth every 6 (six) hours as needed.   Unknown at Unknown time  . albuterol (PROVENTIL HFA;VENTOLIN HFA) 108 (90 Base)  MCG/ACT inhaler Inhale 2 puffs into the lungs every 6 (six) hours as needed for wheezing or shortness of breath. (Patient not taking: Reported on 08/18/2019) 1 Inhaler 0   . sulfacetamide (BLEPH-10) 10 % ophthalmic solution Place 1 drop into the left eye every 3 (three) hours.       Review of Systems  Gastrointestinal: Positive for abdominal pain (Cramping and contractions.).  Genitourinary: Positive for vaginal bleeding. Negative for difficulty urinating, dysuria and vaginal discharge.   Physical Exam   Blood pressure 130/73, pulse (!) 107, temperature 98.5 F (36.9 C), temperature source Oral, resp. rate 16, last menstrual period 03/25/2019, SpO2 100 %, unknown if currently breastfeeding.  Physical Exam  Constitutional: She is oriented to person, place, and time. She appears well-developed and well-nourished. No distress.  HENT:  Head: Normocephalic and atraumatic.  Eyes: Conjunctivae are normal.  Cardiovascular: Normal rate.  Respiratory: Effort normal.  GI: Soft.  Genitourinary:    Vaginal bleeding present.  There is bleeding in the vagina.    Genitourinary Comments: Speculum Exam: -Normal External Genitalia: Non tender, no apparent discharge or blood noted at introitus.  -Vaginal Vault: Pink mucosa with good rugae. Small to moderate amt dark red blood noted in vault and removed with faux swab x6.  Small quarter sized clot removed. -Cervix: Difficult to visualize due to position. But appears closed and without active bleeding. -Bimanual Exam: Dilation: Closed Exam by:: J.Tye Juarez, CNM    Musculoskeletal:        General: Normal range of motion.     Cervical back: Normal range of motion.  Neurological: She is alert and oriented to person, place, and time.  Skin: Skin is warm and dry.  Psychiatric: She has a normal mood and affect. Her behavior is normal.   FHR: TA: 145 bpm, Mod Var, -Decels, +Accels TB: 150 bpm, Mod Var, -Decels, + 10x10 Accels Toco: Irritability, Mild Ctx graphed  Q 3-57min MAU Course  Procedures Results for orders placed or performed during the hospital encounter of 10/18/19 (from the past 24 hour(s))  Urinalysis, Routine w reflex microscopic     Status: Abnormal   Collection Time: 10/18/19  9:11 PM  Result Value Ref Range   Color, Urine STRAW (A) YELLOW   APPearance CLEAR CLEAR   Specific Gravity, Urine 1.004 (L) 1.005 - 1.030   pH 7.0 5.0 - 8.0   Glucose, UA 50 (A) NEGATIVE mg/dL   Hgb urine dipstick LARGE (A) NEGATIVE   Bilirubin Urine NEGATIVE NEGATIVE   Ketones, ur NEGATIVE NEGATIVE mg/dL   Protein, ur NEGATIVE NEGATIVE mg/dL   Nitrite NEGATIVE NEGATIVE   Leukocytes,Ua NEGATIVE  NEGATIVE   RBC / HPF 11-20 0 - 5 RBC/hpf   WBC, UA 0-5 0 - 5 WBC/hpf   Bacteria, UA MANY (A) NONE SEEN   Squamous Epithelial / LPF 0-5 0 - 5       MDM Pelvic Exam Ultrasound Assessment and Plan  34 year old, G3P1011  TIUP at 29.5 weeks Cat I FT Vaginal Bleeding  -Reviewed POC with patient. -Exam performed and findings discussed.  -Patient informed that concern is with vaginal bleeding of unknown origin. Discussed continued difficulty with good visualization of cervix. -Informed that [place] would be ordered to evaluate for notable previa and/or abruption, but that [place] is not considered standard for r/o abruption. -Informed that provider would recommend admission for observation, but would first consult with MD.  -Dr. Sheryn Bison consulted and informed of patient status and provider recommendation.  *Agrees and states that patient should be admitted and [place] should include AFI, Placental Location, and Fetal Presentation. -Limited US ordered  Maryann Conners 10/18/2019, 9:58 PM   Reassessment (11:20 PM)  -K[place] returns without significance for abruption. -[facility] Provider, Arlis Porta, CNM contacted and informed of recommendation for admission.  No questions or concerns. -Patient updated on plan of care and expresses relief with admission. -No further questions or  concerns. -Care Relinquished.   Maryann Conners MSN, CNM Advanced Practice Provider, [facility]

## 2019-10-19 ENCOUNTER — Encounter (HOSPITAL_COMMUNITY): Payer: Self-pay | Admitting: Obstetrics and Gynecology

## 2019-10-19 ENCOUNTER — Observation Stay (HOSPITAL_COMMUNITY): Payer: BC Managed Care – PPO

## 2019-10-19 ENCOUNTER — Observation Stay (HOSPITAL_BASED_OUTPATIENT_CLINIC_OR_DEPARTMENT_OTHER): Payer: BC Managed Care – PPO

## 2019-10-19 DIAGNOSIS — D62 Acute posthemorrhagic anemia: Secondary | ICD-10-CM | POA: Diagnosis present

## 2019-10-19 DIAGNOSIS — O30043 Twin pregnancy, dichorionic/diamniotic, third trimester: Secondary | ICD-10-CM

## 2019-10-19 DIAGNOSIS — Z3A31 31 weeks gestation of pregnancy: Secondary | ICD-10-CM | POA: Diagnosis not present

## 2019-10-19 DIAGNOSIS — O4693 Antepartum hemorrhage, unspecified, third trimester: Secondary | ICD-10-CM

## 2019-10-19 DIAGNOSIS — O321XX1 Maternal care for breech presentation, fetus 1: Secondary | ICD-10-CM | POA: Diagnosis present

## 2019-10-19 DIAGNOSIS — O4593 Premature separation of placenta, unspecified, third trimester: Secondary | ICD-10-CM | POA: Diagnosis present

## 2019-10-19 DIAGNOSIS — R001 Bradycardia, unspecified: Secondary | ICD-10-CM | POA: Diagnosis not present

## 2019-10-19 DIAGNOSIS — D259 Leiomyoma of uterus, unspecified: Secondary | ICD-10-CM | POA: Diagnosis present

## 2019-10-19 DIAGNOSIS — O09523 Supervision of elderly multigravida, third trimester: Secondary | ICD-10-CM | POA: Diagnosis not present

## 2019-10-19 DIAGNOSIS — N939 Abnormal uterine and vaginal bleeding, unspecified: Secondary | ICD-10-CM | POA: Diagnosis not present

## 2019-10-19 DIAGNOSIS — I361 Nonrheumatic tricuspid (valve) insufficiency: Secondary | ICD-10-CM | POA: Diagnosis not present

## 2019-10-19 DIAGNOSIS — O321XX2 Maternal care for breech presentation, fetus 2: Secondary | ICD-10-CM

## 2019-10-19 DIAGNOSIS — Z362 Encounter for other antenatal screening follow-up: Secondary | ICD-10-CM | POA: Diagnosis not present

## 2019-10-19 DIAGNOSIS — O365931 Maternal care for other known or suspected poor fetal growth, third trimester, fetus 1: Secondary | ICD-10-CM | POA: Diagnosis not present

## 2019-10-19 DIAGNOSIS — O9902 Anemia complicating childbirth: Secondary | ICD-10-CM | POA: Diagnosis present

## 2019-10-19 DIAGNOSIS — O34211 Maternal care for low transverse scar from previous cesarean delivery: Secondary | ICD-10-CM | POA: Diagnosis present

## 2019-10-19 DIAGNOSIS — Z20822 Contact with and (suspected) exposure to covid-19: Secondary | ICD-10-CM | POA: Diagnosis present

## 2019-10-19 DIAGNOSIS — Z3A33 33 weeks gestation of pregnancy: Secondary | ICD-10-CM | POA: Diagnosis not present

## 2019-10-19 DIAGNOSIS — O99892 Other specified diseases and conditions complicating childbirth: Secondary | ICD-10-CM | POA: Diagnosis not present

## 2019-10-19 DIAGNOSIS — O24420 Gestational diabetes mellitus in childbirth, diet controlled: Secondary | ICD-10-CM | POA: Diagnosis present

## 2019-10-19 DIAGNOSIS — Z3A29 29 weeks gestation of pregnancy: Secondary | ICD-10-CM

## 2019-10-19 DIAGNOSIS — K449 Diaphragmatic hernia without obstruction or gangrene: Secondary | ICD-10-CM | POA: Diagnosis present

## 2019-10-19 DIAGNOSIS — O34219 Maternal care for unspecified type scar from previous cesarean delivery: Secondary | ICD-10-CM

## 2019-10-19 DIAGNOSIS — K219 Gastro-esophageal reflux disease without esophagitis: Secondary | ICD-10-CM | POA: Diagnosis present

## 2019-10-19 DIAGNOSIS — O328XX2 Maternal care for other malpresentation of fetus, fetus 2: Secondary | ICD-10-CM | POA: Diagnosis present

## 2019-10-19 DIAGNOSIS — Z3A32 32 weeks gestation of pregnancy: Secondary | ICD-10-CM | POA: Diagnosis not present

## 2019-10-19 DIAGNOSIS — Z3A34 34 weeks gestation of pregnancy: Secondary | ICD-10-CM | POA: Diagnosis not present

## 2019-10-19 DIAGNOSIS — O9962 Diseases of the digestive system complicating childbirth: Secondary | ICD-10-CM | POA: Diagnosis present

## 2019-10-19 DIAGNOSIS — O3413 Maternal care for benign tumor of corpus uteri, third trimester: Secondary | ICD-10-CM | POA: Diagnosis present

## 2019-10-19 DIAGNOSIS — R0789 Other chest pain: Secondary | ICD-10-CM | POA: Diagnosis not present

## 2019-10-19 DIAGNOSIS — O24419 Gestational diabetes mellitus in pregnancy, unspecified control: Secondary | ICD-10-CM

## 2019-10-19 LAB — CBC
HCT: 28.9 % — ABNORMAL LOW (ref 36.0–46.0)
Hemoglobin: 9.2 g/dL — ABNORMAL LOW (ref 12.0–15.0)
MCH: 28.6 pg (ref 26.0–34.0)
MCHC: 31.8 g/dL (ref 30.0–36.0)
MCV: 89.8 fL (ref 80.0–100.0)
Platelets: 209 10*3/uL (ref 150–400)
RBC: 3.22 MIL/uL — ABNORMAL LOW (ref 3.87–5.11)
RDW: 15.3 % (ref 11.5–15.5)
WBC: 8 10*3/uL (ref 4.0–10.5)
nRBC: 0 % (ref 0.0–0.2)

## 2019-10-19 LAB — TYPE AND SCREEN
ABO/RH(D): O POS
Antibody Screen: NEGATIVE

## 2019-10-19 LAB — WET PREP, GENITAL
Sperm: NONE SEEN
Trich, Wet Prep: NONE SEEN
Yeast Wet Prep HPF POC: NONE SEEN

## 2019-10-19 LAB — ABO/RH: ABO/RH(D): O POS

## 2019-10-19 LAB — SARS CORONAVIRUS 2 (TAT 6-24 HRS): SARS Coronavirus 2: NEGATIVE

## 2019-10-19 MED ORDER — METRONIDAZOLE 500 MG PO TABS
500.0000 mg | ORAL_TABLET | Freq: Two times a day (BID) | ORAL | Status: DC
  Administered 2019-10-19 – 2019-10-20 (×3): 500 mg via ORAL
  Filled 2019-10-19 (×3): qty 1

## 2019-10-19 MED ORDER — NIFEDIPINE 10 MG PO CAPS
10.0000 mg | ORAL_CAPSULE | Freq: Once | ORAL | Status: AC
  Administered 2019-10-19: 01:00:00 10 mg via ORAL
  Filled 2019-10-19: qty 1

## 2019-10-19 MED ORDER — MAGNESIUM SULFATE 40 GM/1000ML IV SOLN
2.0000 g/h | INTRAVENOUS | Status: DC
  Administered 2019-10-19: 2 g/h via INTRAVENOUS
  Filled 2019-10-19: qty 1000

## 2019-10-19 MED ORDER — MAGNESIUM SULFATE BOLUS VIA INFUSION
4.0000 g | Freq: Once | INTRAVENOUS | Status: AC
  Administered 2019-10-19: 4 g via INTRAVENOUS
  Filled 2019-10-19: qty 1000

## 2019-10-19 MED ORDER — ACETAMINOPHEN 325 MG PO TABS
650.0000 mg | ORAL_TABLET | Freq: Four times a day (QID) | ORAL | Status: DC | PRN
  Administered 2019-10-19 – 2019-11-07 (×10): 650 mg via ORAL
  Filled 2019-10-19 (×10): qty 2

## 2019-10-19 MED ORDER — LORATADINE 10 MG PO TABS
10.0000 mg | ORAL_TABLET | Freq: Every day | ORAL | Status: DC
  Administered 2019-10-19 – 2019-10-27 (×9): 10 mg via ORAL
  Filled 2019-10-19 (×10): qty 1

## 2019-10-19 MED ORDER — MAGNESIUM SULFATE 40 GM/1000ML IV SOLN: 2.0000 g/h | INTRAVENOUS | Status: DC

## 2019-10-19 MED ORDER — LACTATED RINGERS IV BOLUS
500.0000 mL | Freq: Once | INTRAVENOUS | Status: AC
  Administered 2019-10-19: 500 mL via INTRAVENOUS

## 2019-10-19 NOTE — H&P (Addendum)
Kathryn Perry is a 34 y.o. female, G3P1011, IUP at 29.5 weeks with di/di twins presenting for vaginal bleeding.  Pt endorse + fetal movement x 2. States she started feeling some contractions this evening and took a Procardia at [employer]. States she used the restroom at 2100 and noticed bright red bleeding. Denies recent intercourse or anything in vagina. Presented to the MAU and was admitted for observation.   Patient Active Problem List   Diagnosis Date Noted  . Vaginal bleeding 10/18/2019  . Status post cesarean section 10/07/2017  . Post term pregnancy, antepartum 10/06/2017  . Abortion, missed 03/25/2016  . Pelvic pain in female 06/26/2015  . Pain with bowel movements 06/26/2015  . Right ovarian cyst 06/26/2015  . Menorrhagia 06/26/2015  . Dysmenorrhea 06/26/2015  . Rectal bleeding 06/26/2015  . Endometriosis determined by laparoscopy 06/26/2015   Medications Prior to Admission  Medication Sig Dispense Refill Last Dose  . Cetirizine HCl (ZYRTEC PO) Take by mouth.   10/17/2019 at Unknown time  . ferrous sulfate 325 (65 FE) MG tablet Take 1 tablet (325 mg total) by mouth daily. 30 tablet 3 10/18/2019 at Unknown time  . NIFEdipine (PROCARDIA) 10 MG capsule Take 1 capsule (10 mg total) by mouth every 4 (four) hours as needed. 60 capsule 0 10/18/2019 at Unknown time  . Omeprazole (PRILOSEC PO) Take by mouth.   10/17/2019 at Unknown time  . Prenatal Vit-Fe Fumarate-FA (PRENATAL MULTIVITAMIN) TABS tablet Take 1 tablet by mouth daily at 12 noon.   10/17/2019 at Unknown time  . acetaminophen (TYLENOL) 500 MG tablet Take 500 mg by mouth every 6 (six) hours as needed.   Unknown at Unknown time  . albuterol (PROVENTIL HFA;VENTOLIN HFA) 108 (90 Base) MCG/ACT inhaler Inhale 2 puffs into the lungs every 6 (six) hours as needed for wheezing or shortness of breath. (Patient not taking: Reported on 08/18/2019) 1 Inhaler 0   . sulfacetamide (BLEPH-10) 10 % ophthalmic solution Place 1 drop into the left eye every 3  (three) hours.       Past Medical History:  Diagnosis Date  . Anxiety   . Endometriosis   . Missed ab   . Vaginal Pap smear, abnormal   . Wears glasses      No current facility-administered medications on file prior to encounter.   Current Outpatient Medications on File Prior to Encounter  Medication Sig Dispense Refill  . Cetirizine HCl (ZYRTEC PO) Take by mouth.    . ferrous sulfate 325 (65 FE) MG tablet Take 1 tablet (325 mg total) by mouth daily. 30 tablet 3  . NIFEdipine (PROCARDIA) 10 MG capsule Take 1 capsule (10 mg total) by mouth every 4 (four) hours as needed. 60 capsule 0  . Omeprazole (PRILOSEC PO) Take by mouth.    . Prenatal Vit-Fe Fumarate-FA (PRENATAL MULTIVITAMIN) TABS tablet Take 1 tablet by mouth daily at 12 noon.    Marland Kitchen acetaminophen (TYLENOL) 500 MG tablet Take 500 mg by mouth every 6 (six) hours as needed.    Marland Kitchen albuterol (PROVENTIL HFA;VENTOLIN HFA) 108 (90 Base) MCG/ACT inhaler Inhale 2 puffs into the lungs every 6 (six) hours as needed for wheezing or shortness of breath. (Patient not taking: Reported on 08/18/2019) 1 Inhaler 0  . sulfacetamide (BLEPH-10) 10 % ophthalmic solution Place 1 drop into the left eye every 3 (three) hours.       Allergies  Allergen Reactions  . Nsaids Hives    Aleve (naproxen), Ibuprofen, ASA  . Percocet [Oxycodone-Acetaminophen] Hives  Pt reports being able to take Tylenol #3 without adverse effects  . Asa [Aspirin] Hives    History of present pregnancy: Pt Info/Preference:  Screening/Consents:  Labs:   EDD: Estimated Date of Delivery: 12/30/19  Establised: Patient's last menstrual period was 03/25/2019.  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 VBAC: No RPR:   non-reactive  Circumcision: N/A   HBsAg:  neg  Pediatrician:  ???   HIV:   neg  Prenatal Classes: No Additional [place]:  GBS:  (For PCN allergy, check sensitivities)       Chlamydia: neg    MFM Referral/Consult: Yes GC: neg  Support Person: ???   PAP: 2020  Pain Management: Repeat C/S Neonatologist Referral: Yes Hgb Electrophoresis:  AA  Birth Plan: No   Hgb NOB: 12.0   28W: 9.3   OB History    Gravida  3   Para  1   Term  1   Preterm      AB  1   Living  1     SAB  1   TAB      Ectopic      Multiple  0   Live Births  1          Past Medical History:  Diagnosis Date  . Anxiety   . Endometriosis   . Missed ab   . Vaginal Pap smear, abnormal   . Wears glasses    Past Surgical History:  Procedure Laterality Date  . CESAREAN SECTION N/A 10/07/2017   Procedure: CESAREAN SECTION;  Surgeon: Sanjuana Kava, MD;  Location: [place] [facility];  Service: Obstetrics;  Laterality: N/A;  . CHROMOPERTUBATION N/A 06/26/2015   Procedure: CHROMOPERTUBATION;  Surgeon: Eldred Manges, MD;  Location: [facility];  Service: Gynecology;  Laterality: N/A;  . DILATION AND CURETTAGE OF UTERUS    . DILATION AND EVACUATION N/A 03/27/2016   Procedure: DILATATION AND EVACUATION;  Surgeon: Eldred Manges, MD;  Location: [place];  Service: Gynecology;  Laterality: N/A;  . LAPAROSCOPIC OVARIAN CYSTECTOMY N/A 06/26/2015   Procedure: LAPAROSCOPIC OVARIAN CYSTECTOMY;  Surgeon: Eldred Manges, MD;  Location: [facility];  Service: Gynecology;  Laterality: N/A;  . LAPAROSCOPY N/A 06/26/2015   Procedure: LAPAROSCOPY OPERATIVE with excision of fEndometriosis and ablation of endometriosis;  Surgeon: Eldred Manges, MD;  Location: [facility];  Service: Gynecology;  Laterality: N/A;  . LEEP  2015   . WISDOM TOOTH EXTRACTION     Family History: family history includes Colon cancer in her paternal grandfather. Social History:  reports that she has never smoked. She has never used smokeless tobacco. She reports previous alcohol use. She reports that she does not use drugs.   Prenatal Transfer Tool  Maternal Diabetes: Yes:  Diabetes Type:  Diet controlled Genetic Screening: Normal Maternal Ultrasounds/Referrals: Normal Fetal Ultrasounds or other Referrals:  None Maternal Substance Abuse:  No Significant Maternal Medications:  Meds include: Other:  Significant Maternal Lab Results: None  ROS:  Review of Systems  Constitutional: Negative for chills and fever.  HENT: Negative for congestion and sore throat.   Eyes: Negative for blurred vision and double vision.  Respiratory: Negative for cough and shortness of breath.   Cardiovascular: Negative for chest pain and leg swelling.  Gastrointestinal: Positive for heartburn. Negative for abdominal pain, nausea and vomiting.  Genitourinary: Negative for frequency and urgency.  Musculoskeletal: Negative for back pain.  Neurological: Negative for headaches.  Psychiatric/Behavioral: Negative for depression. The patient is not nervous/anxious.     Physical Exam: BP (!) 112/59 (BP Location: Right Arm)   Pulse 98   Temp 98.4 F (36.9 C) (Oral)   Resp 19   Ht 5\' 1"  (1.549 m)   Wt 82.6 kg   LMP 03/25/2019   SpO2 97%   BMI 34.39 kg/m   Physical Exam  Constitutional: She is oriented to person, place, and time and well-developed, well-nourished, and in no distress.  HENT:  Head: Normocephalic.  Eyes: Pupils are equal, round, and reactive to light.  Cardiovascular: Normal rate and regular rhythm.  Pulmonary/Chest: Effort normal and breath sounds normal.  Abdominal: Soft.  Genitourinary:    Vagina, cervix and uterus normal.   Musculoskeletal:        General: Normal range of motion.     Cervical back: Normal range of motion.   Neurological: She is alert and oriented to person, place, and time.  Skin: Skin is warm and dry.  Psychiatric: Affect normal.    NST: Fetus A: FHR baseline 140 bpm, Variability: moderate, Accelerations:present, Decelerations:  Absent= Cat 1/Reactive  Fetus B: FHR baseline 140 bpm, Variability: moderate, Accelerations:present, Decelerations:  Absent= Cat 1/Reactive UC:   irregular, every 2-8 minutes SVE:   Dilation: Closed Exam by:: J.Emly, CNM, vertex verified by fetal sutures.   Labs: Results for orders placed or performed during the hospital encounter of 10/18/19 (from the past 24 hour(s))  Urinalysis, Routine w reflex microscopic     Status: Abnormal   Collection Time: 10/18/19  9:11 PM  Result Value Ref Range   Color, Urine STRAW (A) YELLOW   APPearance CLEAR CLEAR   Specific Gravity, Urine 1.004 (L) 1.005 - 1.030   pH 7.0 5.0 - 8.0   Glucose, UA 50 (A) NEGATIVE mg/dL   Hgb urine dipstick LARGE (A) NEGATIVE   Bilirubin Urine NEGATIVE NEGATIVE   Ketones, ur NEGATIVE NEGATIVE mg/dL   Protein, ur NEGATIVE NEGATIVE mg/dL   Nitrite NEGATIVE NEGATIVE   Leukocytes,Ua NEGATIVE NEGATIVE   RBC / HPF 11-20 0 - 5 RBC/hpf   WBC, UA 0-5 0 - 5 WBC/hpf   Bacteria, UA MANY (A) NONE SEEN   Squamous Epithelial / LPF 0-5 0 - 5  Type and screen [place]     Status: None   Collection Time: 10/18/19 11:29 PM  Result Value Ref Range   ABO/RH(D) O POS    Antibody Screen NEG    Sample Expiration      10/21/2019,2359 Performed at [facility], [address] [place], [place] [address]   ABO/Rh     Status: None   Collection Time: 10/18/19 11:29 PM  Result Value Ref Range   ABO/RH(D)      O POS Performed at [facility] [address], [place], [place] [address]   CBC on admission     Status: Abnormal   Collection Time: 10/18/19 11:47 PM  Result Value Ref Range   WBC 8.0 4.0 - 10.5  K/uL   RBC 3.22 (L) 3.87 - 5.11 MIL/uL   Hemoglobin 9.2 (L) 12.0 - 15.0  g/dL   HCT 28.9 (L) 36.0 - 46.0 %   MCV 89.8 80.0 - 100.0 fL   MCH 28.6 26.0 - 34.0 pg   MCHC 31.8 30.0 - 36.0 g/dL   RDW 15.3 11.5 - 15.5 %   Platelets 209 150 - 400 K/uL   nRBC 0.0 0.0 - 0.2 %  Wet prep, genital     Status: Abnormal   Collection Time: 10/19/19  2:53 AM  Result Value Ref Range   Yeast Wet Prep HPF POC NONE SEEN NONE SEEN   Trich, Wet Prep NONE SEEN NONE SEEN   Clue Cells Wet Prep HPF POC PRESENT (A) NONE SEEN   WBC, Wet Prep HPF POC MANY (A) NONE SEEN   Sperm NONE SEEN    MAU Course: Orders Placed This Encounter  Procedures  . SARS CORONAVIRUS 2 (TAT 6-24 HRS) Nasopharyngeal Nasopharyngeal Swab  . Wet prep, genital  . [place] MFM OB Limited  . Urinalysis, Routine w reflex microscopic  . CBC on admission  . Diet NPO time specified  . Notify physician (specify)  . Vital signs  . Defer vaginal exam for vaginal bleeding or PROM <37 weeks  . Initiate Oral Care Protocol  . Initiate Carrier Fluid Protocol  . SCDs  . Informed Consent Details: Physician/Practitioner Attestation; Transcribe to consent form and obtain patient signature  . Fetal monitoring  . Continuous tocometry  . Bed rest with bathroom privileges  . Full code  . Type and screen [place]  . ABO/Rh  . Place in observation (patient's expected length of stay will be less than 2 midnights)   Meds ordered this encounter  Medications  . zolpidem (AMBIEN) tablet 5 mg  . docusate sodium (COLACE) capsule 100 mg  . calcium carbonate (TUMS - dosed in mg elemental calcium) chewable tablet 400 mg of elemental calcium  . prenatal multivitamin tablet 1 tablet  . lactated ringers infusion  . NIFEdipine (PROCARDIA) capsule 10 mg  . lactated ringers bolus 500 mL  . magnesium bolus via infusion 4 g  . magnesium sulfate 40 grams in SWI 1000 mL OB infusion   Assessment/Plan: Kathryn Perry is a 34 y.o. female, G3P1011, IUP at 29.5 weeks, presenting for vaginal bleeding and preterm contractions. .    Cat 1 tracing on both [name] and baby B  Plan: 1. Admit for observation 2. Start magnesium sulfate for tocolysis/neuro protection 3. BMZ complete on 3/28 4. GC/Wet prep collected, results pending 5. Strict bedrest 6. NPO for now 7. Continuous monitoring  [name], MSN 10/19/2019, 3:25 AM

## 2019-10-19 NOTE — Consult Note (Signed)
MFM Note  Kathryn Perry is a 34 year old gravida 3 para 1-0-1-1 who is currently at 29 weeks and 4 days with a dichorionic, diamniotic twin gestation.  She was admitted early this morning due to bright red vaginal bleeding after using the bathroom.  The patient reports that she felt lower abdominal cramping and possibly some contractions prior to the onset of vaginal bleeding.    The patient was examined in the MAU where her cervix was felt to be closed.  There were some dark red blood and blood clots noted during her exam in the MAU.  The patient had been evaluated in the MAU last week due to cramping and contractions.  She received a complete course of antenatal corticosteroids at that time and was also placed on Procardia every 4 hours as needed for contractions.  Due to the twin gestation with vaginal bleeding and contractions noted on the fetal heart rate monitor, the patient was admitted to the hospital for observation.  She was started on magnesium sulfate for fetal neuro protection and tocolysis.  She reports that her vaginal bleeding has resolved.  The patient had a limited ultrasound performed at the time of admission that showed normal amniotic fluid around both fetuses.  Both fetuses are in the breech/breech presentations.  A transvaginal ultrasound performed today showed a cervical length of 3.6 cm long without any signs of funneling.  There were no signs of placenta previa noted on today's ultrasound.  The patient had a growth ultrasound last week that showed EFW for twin A of 2 pounds 14 ounces (39th percentile for her gestational age) and an EFW for twin B of 2 pounds 9 ounces (14th percentile for her gestational age).    Her current pregnancy has also been complicated by A1 gestational diabetes.  The patient reports a past medical history of anxiety, endometriosis and abnormal Pap smears.    Her past surgical history includes a cesarean section in 2019, a D&E for a miscarriage, and a  laparoscopic ovarian cystectomy.  Her blood type is O+.  Her NSTs are reactive x2.  Irregular contractions are noted on the toco.  The patient was advised that the cause of her vaginal bleeding remains undetermined at this time.  Due to the lower abdominal cramping/uterine contractions that have been noted over the past few days, preterm labor or placental abruption are on the differential diagnoses.  Due to vaginal bleeding at 29+ weeks in this twin gestation, I would recommend that she be observed in the hospital for at least 7 days to assess for any further contractions or bleeding.  As delivery does not appear to be imminent, magnesium sulfate was discontinued. Nifedipine may be continued for tocolysis as she continues to have irregular contractions.   A rescue course of steroids should be administered should it be 7 days or greater since she received the initial course and she is at risk for delivery.  Magnesium sulfate should be restarted for fetal neuro protection should she be at risk for delivery prior to 32 weeks.    Should the frequency of her contractions increase or should she display signs of preterm labor, I would be aggressive with tocolyisis at her current gestational age.  A course of Indocin (50 mg p.o. x1 followed by 25 mg p.o. every 6 hours to complete a 48-hour course) may be considered along with the magnesium for fetal neuro protection.    Should the patient remain stable and be discharged home after 7 days, we  will continue to follow her closely with growth ultrasounds.  At the end of the consultation, the patient stated that all her questions had been answered to her complete satisfaction.  Thank you for referring this patient for a Maternal-Fetal Medicine consultation.  Recommendations: -Inpatient observation for 7 days after her last episode of bleeding -Daily fetal testing -Weekly biophysical profiles with fluid checks while hospitalized -Magnesium sulfate for fetal  neuro protection should she require delivery before 32 weeks -Rescue course of steroids as indicated -Tocolysis as needed

## 2019-10-19 NOTE — Plan of Care (Signed)
  Problem: Education: Goal: Knowledge of General Education information will improve Description: Including pain rating scale, medication(s)/side effects and non-pharmacologic comfort measures Outcome: Completed/Met

## 2019-10-20 LAB — GROUP B STREP BY PCR: Group B strep by PCR: NEGATIVE

## 2019-10-20 LAB — GC/CHLAMYDIA PROBE AMP (~~LOC~~) NOT AT ARMC
Chlamydia: NEGATIVE
Comment: NEGATIVE
Comment: NORMAL
Neisseria Gonorrhea: NEGATIVE

## 2019-10-20 MED ORDER — SIMETHICONE 80 MG PO CHEW
80.0000 mg | CHEWABLE_TABLET | Freq: Three times a day (TID) | ORAL | Status: DC
  Administered 2019-10-20 – 2019-11-19 (×88): 80 mg via ORAL
  Filled 2019-10-20 (×93): qty 1

## 2019-10-20 MED ORDER — MAGNESIUM OXIDE 400 (241.3 MG) MG PO TABS
400.0000 mg | ORAL_TABLET | Freq: Every day | ORAL | Status: DC
  Administered 2019-10-20 – 2019-10-27 (×8): 400 mg via ORAL
  Filled 2019-10-20 (×9): qty 1

## 2019-10-20 MED ORDER — POLYSACCHARIDE IRON COMPLEX 150 MG PO CAPS
150.0000 mg | ORAL_CAPSULE | Freq: Every day | ORAL | Status: DC
  Administered 2019-10-20 – 2019-10-31 (×12): 150 mg via ORAL
  Filled 2019-10-20 (×13): qty 1

## 2019-10-20 MED ORDER — FAMOTIDINE 20 MG PO TABS
20.0000 mg | ORAL_TABLET | Freq: Every day | ORAL | Status: DC
  Administered 2019-10-20 – 2019-10-25 (×3): 20 mg via ORAL
  Filled 2019-10-20 (×5): qty 1

## 2019-10-20 NOTE — Progress Notes (Addendum)
Hospital day # 1 pregnancy at [redacted]w[redacted]d--Kathryn Perry is a 34 y.o. female, G3P1011, IUP at 29.6 weeks, presenting for vaginal bleeding and preterm contractions. S/P 12 hour magnesium for neuroprotection.   S:  Pt resting in bed awake and stable, on phone ordering breakfast. Pt endorses seeing Kathryn Perry old blood in the toilet after urinating the morning but non on the pad, pt endorses +FM, endorses random cxt but similar to usual pregnancy for her, Pt agreeable to stay 7 days for monitor, and aware of POC , denies further questions. Mood is stable.       Perception of contractions: none, irregular, occ, moderate for her, but simlilar to usually cxt without cervical change.       Vaginal bleeding: none now       Vaginal discharge:  no significant change  O: BP 118/62 (BP Location: Right Arm)   Pulse 93   Temp 98.3 F (36.8 C) (Oral)   Resp 18   Ht 5\' 1"  (1.549 m)   Wt 82.6 kg   LMP 03/25/2019   SpO2 98%   BMI 34.39 kg/m       Fetal tracings: Baseline [name] 140s, Baby B 145s, =acels, -decels, reactive for gestational age.       Contractions:         Uterus gravid and non-tender      Abdomen: soft non-tender      Extremities: no significant edema and no signs of DVT          Labs:  GBS pending. CG/C pending. Wet Prep + BV. Covid -.        Meds: S/P 12 hour magnesium , procardia 10mg  PRN for cxt.   A: [redacted]w[redacted]d with Hospital day # 1 pregnancy at [redacted]w[redacted]d--Kathryn Perry is a 34 y.o. female, G3P1011, IUP at 29.6 weeks, with identical DiDi twins presenting for vaginal bleeding and preterm contractions. S/P 12 hour magnesium for neuroprotection. H/O C/S.  P: Vaginal bleeding: Started 10/18/2019, light first day of menses, since taper off, stable now, [name] spotting only when urinating and wiping, not on pad, cervical length noted to be 3.4cm on 4/1, recommend monitor for 7 days per MFM. Bedrest. Regular diet. Pelvis rest.   Preterm CXT: SVE on 3/31-4/1 was 0/TH/High and no change. S/P 12 hour magnesium, 10mg   procardia PRN, toco continuous if pt feels cxt, MFM recommended if pt begins cxt, then toco, start magnesium 4gm loading and 2gm/hr, indomethacin PO 50mg  then Q6 25mg  PO for tocolysis, if unable to stop labor plan for repeat CS.   Fetal well-being: s/p 12 hour mag for neruoprotectant, US showed, breech, breech babies, Qshift NST reactive for gestational age. BMZ complete 3/27-28, recuse dose if labor in one week. EFW 2.14llbs 39% for [name] and B is 2.9 lbs 14% on 3/26. Plan for Qshift NST. Plan for magnesium to be started for if labor starts for neuroprotectant.   Failed 1HGTT: Will put in for 3 H GTT test to begin tomorrow morning per pts request.   GBS:  Unknown, pending results today.   BV: Denies s/sx, continue tx with flagyl 500mg  BIS x7 days, today day #2.   Anemia: Admission hgb was 9.2, asymptomatic. Start Iron PO today with PO magnesium to prevent constipation.   DR Mancel Bale to be updated on pt status.   [facility] CNM, MSN 10/20/2019 9:07 AM   GBS neg

## 2019-10-21 LAB — GLUCOSE, 2 HOUR GESTATIONAL: Glucose Tolerance, 2 hour: 256 mg/dL — ABNORMAL HIGH (ref 70–164)

## 2019-10-21 LAB — TYPE AND SCREEN
ABO/RH(D): O POS
Antibody Screen: NEGATIVE

## 2019-10-21 LAB — GLUCOSE, 3 HOUR GESTATIONAL: Glucose, GTT - 3 Hour: 233 mg/dL — ABNORMAL HIGH (ref 70–144)

## 2019-10-21 LAB — GLUCOSE, FASTING GESTATIONAL: Glucose Tolerance, Fasting: 139 mg/dL

## 2019-10-21 LAB — GLUCOSE, 1 HOUR GESTATIONAL: Glucose Tolerance, 1 hour: 230 mg/dL — ABNORMAL HIGH (ref 70–189)

## 2019-10-21 LAB — GLUCOSE, CAPILLARY
Glucose-Capillary: 166 mg/dL — ABNORMAL HIGH (ref 70–99)
Glucose-Capillary: 109 mg/dL — ABNORMAL HIGH (ref 70–99)

## 2019-10-21 MED ORDER — OMEPRAZOLE 20 MG PO CPDR
20.0000 mg | DELAYED_RELEASE_CAPSULE | Freq: Every day | ORAL | Status: DC
  Administered 2019-10-21 – 2019-11-23 (×33): 20 mg via ORAL
  Filled 2019-10-21 (×35): qty 1

## 2019-10-21 MED ORDER — INSULIN ASPART 100 UNIT/ML ~~LOC~~ SOLN
0.0000 [IU] | Freq: Four times a day (QID) | SUBCUTANEOUS | Status: DC | PRN
  Administered 2019-10-24 – 2019-11-09 (×8): 2 [IU] via SUBCUTANEOUS

## 2019-10-21 MED ORDER — NON FORMULARY: 20.0000 mg | Freq: Every day | Status: DC

## 2019-10-21 NOTE — Progress Notes (Signed)
Inpatient Diabetes Program Recommendations  Diabetes Treatment Program Recommendations  ADA Standards of Care 2018 Diabetes in Pregnancy Target Glucose Ranges:  Fasting: 60 - 90 mg/dL Preprandial: 60 - 105 mg/dL 1 hr postprandial: Less than 140mg /dL (from first bite of meal) 2 hr postprandial: Less than 120 mg/dL (from first bite of meal)    No results found for: GLUCAP, HGBA1C  Review of Glycemic Control Results for RAGENA, DOVALE (MRN [id number]) as of 10/21/2019 12:18  Ref. Range 10/21/2019 06:01 10/21/2019 07:08 10/21/2019 08:16 10/21/2019 09:09  Glucose, GTT - 3 Hour Latest Ref Range: 70 - 144 mg/dL    233 (H)  Glucose, Fasting-Gestational Latest Units: mg/dL 139     Glucose, 1 Hour-Gestational Latest Ref Range: 70 - 189 mg/dL  230 (H)    Glucose, 2 Hour-Gestational Latest Ref Range: 70 - 164 mg/dL   256 (H)    Diabetes history: new dm? Outpatient Diabetes medications: none Current orders for Inpatient glycemic control: none  Inpatient Diabetes Program Recommendations:    Consider: -FSBG and 1 HR pp CBG checks -If 1 HR post pranidals > 140 mg/dL would add Novolog 0-14 units TID (under Diabetes in Pregnancy order set)  Even while in the setting of steroids, based on results of GTT would anticipate need for insulin.  Will continue to follow.   Spoke with patient regarding 3hr GTT results.  Patient educated on target goal range, DM in pregnancy, insulin resistance, hormonal impact on glucose trends, risk for type 2 development per ADA, guidelines on rechecking postpartum, carb alottment for pregnancy, frequency of CBG checks in pregnancy and hyperinsulinemia in the neonate.  encouraged patient to begin learning normal goal ranges and being mindful on carb intake and counting. Will place dietitian consult. Will plan to see on Monday.   Thanks, Bronson Curb, MSN, RNC-OB Diabetes Coordinator [phone] (8a-5p)

## 2019-10-21 NOTE — Progress Notes (Signed)
Derrell Lolling, CNM notified of patient's results of her 3 hour Glucose testing. Daniela to consult with Dr. Mancel Bale.  Results for JULIZZA, CHIARI (MRN [id number]) as of 10/21/2019 10:28  Ref. Range 10/21/2019 07:08 10/21/2019 08:16 10/21/2019 09:09  Glucose, GTT - 3 Hour Latest Ref Range: 70 - 144 mg/dL   233 (H)  Glucose, 1 Hour-Gestational Latest Ref Range: 70 - 189 mg/dL 230 (H)    Glucose, 2 Hour-Gestational Latest Ref Range: 70 - 164 mg/dL  256 (H)    Toya Smothers, RN

## 2019-10-21 NOTE — Progress Notes (Signed)
Derrell Lolling, CNM notified of BS 166. No new orders given. Will continue to monitor. Toya Smothers, RN

## 2019-10-21 NOTE — Progress Notes (Addendum)
Patient ID: Kathryn Perry, female   DOB: 02/10/1986, 34 y.o.   MRN: [id number]  Hospital day # 2, identical DiDi twins pregnancy at [redacted]w[redacted]d   G3P1011, IUP at29.6weeks,admit for vaginal bleeding and preterm contractions. S/P 12 hour magnesium for neuroprotection.  BMZ course 3/27-28  S: Resting in bed, reports no VB today, + FM. Occasional mild ctx.  Notes increased HB since changing to Pepcid management inpatient, takes Prilosec at home which has been very effective and wants to resume same med.  Otherwise feels well in good spirits. Spouse will be over later today to keep her company.   O: BP 120/85 (BP Location: Right Arm)   Pulse (!) 102   Temp 97.7 F (36.5 C) (Oral)   Resp 15   Ht 5\' 1"  (1.549 m)   Wt 82.6 kg   LMP 03/25/2019   SpO2 100%   BMI 34.39 kg/m    3GTT results  Ref. Range 10/21/2019 07:08 10/21/2019 08:16 10/21/2019 09:09  Glucose, GTT - 3 Hour Latest Ref Range: 70 - 144 mg/dL   233 (H)  Glucose, 1 Hour-Gestational Latest Ref Range: 70 - 189 mg/dL 230 (H)    Glucose, 2 Hour-Gestational Latest Ref Range: 70 - 164 mg/dL  256 (H)       FHT: Twin A:Baseline: 140 bpm, Variability: Good {> 6 bpm), Accelerations: Reactive and Decelerations: Absent   Twin B:Baseline: 140 bpm, Variability: Good {> 6 bpm), Accelerations: Reactive and Decelerations: Absent  Toco: rare ctx [place]:[id number]: Closed Exam by:: J.Emly, CNM  A/P- 34 y.o. at [redacted]w[redacted]d, identical DiDi twins admitted with preterm contractions  and vaginal bleeding  Hx C/S  Dating:  [redacted]w[redacted]d  PNL Needed: rpt 3GTT 1 week if CBG's normalize over the course of next week  FWB:  Reassuring x 2, continue q shift NST EFW  twin A of 2 pounds 14 ounces (39th percentile for her gestational age)  EFW for twin B of 2 pounds 9 ounces (14th percentile for her gestational age).    PTL: stable, no new VB    Procardia PRN  Failed 3HGTT: suspect effect of steroids course affecting results  - start daily CBG - fasting and 1 hr PP x 3  -  Low carb diet, refer to dietitian and diabetes specialist for management  - plan rpt 3gtt 1 week if CBG normalize  GBS:  negative 10/20/19  BV: Denies s/sx, continue tx with flagyl 500mg  BID x7 days, today day #3.   - will change to IV Flagyl if Prilosec not effective in controlling GERD sx  Anemia: Admission hgb was 9.2, asymptomatic. Started Iron PO today with PO magnesium to prevent constipation. Rpt CBC 2-3 wks for effectiveness, consider IV iron if hgb < 10  ROD: C-section with labor    MFM Recommendations: -Inpatient observation for 7 days after her last episode of bleeding -Daily fetal testing -Weekly biophysical profiles with fluid checks while hospitalized -Magnesium sulfate for fetal neuro protection should she require delivery before 32 weeks -Rescue course of steroids as indicated -Tocolysis as needed  Update to Dr. Katheran Awe, CNM, MSN 10/21/2019, 11:31 AM  Will do fasting, 2hr PPs and SSI.  Appreciate dietitian consult.  Carb modified diet.

## 2019-10-22 LAB — GLUCOSE, CAPILLARY
Glucose-Capillary: 76 mg/dL (ref 70–99)
Glucose-Capillary: 87 mg/dL (ref 70–99)
Glucose-Capillary: 108 mg/dL — ABNORMAL HIGH (ref 70–99)
Glucose-Capillary: 74 mg/dL (ref 70–99)

## 2019-10-22 MED ORDER — NIFEDIPINE 10 MG PO CAPS
10.0000 mg | ORAL_CAPSULE | ORAL | Status: DC | PRN
  Administered 2019-10-22 – 2019-10-24 (×3): 10 mg via ORAL
  Filled 2019-10-22 (×3): qty 1

## 2019-10-22 MED ORDER — METRONIDAZOLE 500 MG PO TABS
500.0000 mg | ORAL_TABLET | Freq: Two times a day (BID) | ORAL | Status: AC
  Administered 2019-10-22 – 2019-10-26 (×10): 500 mg via ORAL
  Filled 2019-10-22 (×10): qty 1

## 2019-10-22 NOTE — Progress Notes (Addendum)
EFM difficult this afternoon due to fetal movements and positions.  RN sat at bedside and held US transducers for last 10 minutes of monitoring.  Pt had 4 contractions during afternoon tracing - 2 captured and an additional 2 felt by pt before toco was adjusted mid-tracing.  Gave 10 mg Procardia per orders.  Pt verbalized understanding of reporting additional contractions that she feels.

## 2019-10-22 NOTE — Progress Notes (Signed)
Hospital day # 3 pregnancy at [redacted]w[redacted]d--Kathryn Perry is a 34 y.o. female, G3P1011, IUP at 30.1 weeks, presenting for vaginal bleeding and preterm contractions. S/P 12 hour magnesium for neuroprotection.   S:  Pt resting in bed awake and stable, on phone ordering breakfast. Pt endorses seeing [place] old blood in the toilet after urinating the morning but non on the pad again today, endorses cxt over the night which were occ and none now, when Dr Mancel Bale rounded on her she has x2cxt back to back, Dr Mancel Bale encouraged her to use the restroom frequently and stay hydrated.  pt endorses +FM, endorses random cxt but similar to usual pregnancy for her, Pt agreeable to stay 7 days for monitor, but was unaware that ment from the time no [place]-blood was noted, pt denies seeing any blood or [place] on her pad at all, pt infomred me she will reassess her progress later in the week and aware of POC , denies further questions. Mood is stable.       Perception of contractions: none, irregular, occ, moderate for her, but simlilar to usually cxt without  cervical change.       Vaginal bleeding: none now       Vaginal discharge:  [place]   O: BP (!) 112/59 (BP Location: Right Arm)   Pulse (!) 101   Temp 97.9 F (36.6 C) (Oral)   Resp 18   Ht 5\' 1"  (1.549 m)   Wt 82.6 kg   LMP 03/25/2019   SpO2 100%   BMI 34.39 kg/m       Fetal tracings: Baseline [name] 145s, Baby B 140s, =acels, -decels, reactive for gestational age.       Contractions:         Uterus gravid and non-tender      Abdomen: soft non-tender      Extremities: no significant edema and no signs of DVT          Labs:  GBS Negative. CG/C negative. Wet Prep + BV. Covid -.  CBG (last 3)  Recent Labs    10/21/19 1951 10/22/19 0813 10/22/19 1055  GLUCAP 109* 76 87        Meds: S/P 12 hour magnesium , procardia 10mg  PRN for cxt.   A: [redacted]w[redacted]d with Hospital day # 3 pregnancy at [redacted]w[redacted]d--Kathryn Perry is a 34 y.o. female, G3P1011, IUP at 30.1 weeks, with identical  DiDi twins presenting for vaginal bleeding and preterm contractions. S/P 12 hour magnesium for neuroprotection. H/O C/S.  P: Vaginal bleeding: Started 10/18/2019, light first day of menses, since taper off, stable now, [place] spotting only when urinating and wiping, not on pad, cervical length noted to be 3.4cm on 4/1, recommend monitor for 7 days per MFM. Bedrest. Regular diet. Pelvis rest.   Preterm CXT: SVE on 3/31-4/1 was 0/TH/High and no change. S/P 12 hour magnesium, 10mg  procardia PRN, toco continuous if pt feels cxt, MFM recommended if pt begins cxt, then toco, start magnesium 4gm loading and 2gm/hr, indomethacin PO 50mg  then Q6 25mg  PO for tocolysis, if unable to stop labor plan for repeat CS.   Fetal well-being: s/p 12 hour mag for neruoprotectant, US showed, breech, breech babies, Qshift NST reactive for gestational age. BMZ complete 3/27-28, recuse dose if labor in one week. EFW 2.14llbs 39% for [name] and B is 2.9 lbs 14% on 3/26. Plan for Qshift NST. Plan for magnesium to be started for if labor starts for neuroprotectant.   GDMA1: New problem,  failed 1h and 3H GTT on 10/21/2019, 6days post being given BMZ. CBGs WNL, no insulin needed. Low carb diabetic diet, coordinator to assess for caloric intake due to twins. Sliding scale with 2 H PP and FBS to be checked.    GBS:  Negative  BV: Denies s/sx, continue tx with flagyl 500mg  BID x7 days, today day #2.   Anemia: Admission hgb was 9.2, asymptomatic. Start Iron PO today with PO magnesium to prevent constipation.   GERD: Prilosec.  DR Mancel Bale to be updated on pt status.   [facility] CNM, MSN 10/22/2019 10:58 AM   GBS neg

## 2019-10-23 LAB — GLUCOSE, CAPILLARY
Glucose-Capillary: 75 mg/dL (ref 70–99)
Glucose-Capillary: 93 mg/dL (ref 70–99)
Glucose-Capillary: 99 mg/dL (ref 70–99)
Glucose-Capillary: 102 mg/dL — ABNORMAL HIGH (ref 70–99)

## 2019-10-23 NOTE — Progress Notes (Signed)
Patient called out and stated she was bleeding.Sitting on side of bed with a streak of bright blood on the bed pad. A few small drops were on the  bathroom floor, and a small amount of bright blood was in the toilet.Patient states she feels some mild period type cramps.[place] was notified and patient was given 10 mg of Procardia po. A peri  pad was placed on the patient and will check it in an hour unless she feels and increase in bleeding before that

## 2019-10-23 NOTE — Progress Notes (Signed)
Pad check done only a scant amount of serous blood noted on the edge of pad.Pad changed to keep go watch on bleeding.Saw Jade this morning and updated her on bleeding and pain.

## 2019-10-23 NOTE — Progress Notes (Signed)
Checked pad, a small amount of maroon blood noted on pad.Placed on EFM and will check pad in an hour.

## 2019-10-23 NOTE — Progress Notes (Signed)
Initial Nutrition Assessment  DOCUMENTATION CODES:  Not applicable  INTERVENTION:  CHO modified gestational diabetic diet/ double protein portions Nutrition education consult for Carbohydrate Modified Gestational Diabetic Diet completed  NUTRITION DIAGNOSIS:  Increased nutrient needs related to (twin pregnancy) as evidenced by (30 weeks IUP).  GOAL:  Patient will meet greater than or equal to 90% of their needs, Weight gain  MONITOR:  Labs  REASON FOR ASSESSMENT: Education Gestational Diabetes   ASSESSMENT:  30 2/7 weeks IUP, adm with vaginal bleeding/ twins. Abn 3 hours GTT. No pre-pregancy weight in EPIC. 12 lbs weight gain since 1/29. Good appetite.  Pt with very good serum glucose levels, not requiring  Insulin  "Meal  plan for gestational diabetics" handout given to patient. Husband present. Basic concepts reviewed. CHO limits reviewed Questions answered. Discussed food options that could be brought in by family members to help with food variety at snack and meal times.  Patient verbalizes understanding. Expect good compliance   Diet Order:   Diet Order            Diet gestational carb mod Fluid consistency: Thin; Room service appropriate? Yes  Diet effective now              EDUCATION NEEDS:   Education needs have been addressed/ GDM diet  Skin:  Skin Assessment: Reviewed RN Assessment  Height:   Ht Readings from Last 1 Encounters:  10/19/19 5\' 1"  (1.549 m)    Weight:   Wt Readings from Last 1 Encounters:  10/19/19 82.6 kg    Ideal Body Weight:   105 lbs  BMI:  Body mass index is 34.39 kg/m.  Estimated Nutritional Needs:   Kcal:  2000- 2200  Protein:  90 -110 g  Fluid:  2.3 L    Weyman Rodney M.Fredderick Severance LDN Neonatal Nutrition Support Specialist/RD III

## 2019-10-23 NOTE — Progress Notes (Signed)
Hospital day # 4 pregnancy at [redacted]w[redacted]d--Kathryn Perry is a 34 y.o. female, G3P1011, IUP at 30.2 weeks, presenting for vaginal bleeding and preterm contractions. S/P 12 hour magnesium for neuroprotection.   S:  Pt resting in bed awake and stable, on phone ordering breakfast. New episode of bleeding overnight has now transitioned to [place] spotting on current pad. No further cramping. O: BP 117/73 (BP Location: Right Arm)   Pulse 88   Temp 98.4 F (36.9 C) (Oral)   Resp 16   Ht 5\' 1"  (1.549 m)   Wt 82.6 kg   LMP 03/25/2019   SpO2 97%   BMI 34.39 kg/m       Fetal tracings: Baseline [name] 145s, Baby B 140s, =acels, -decels, reactive for gestational age.       Contractions:   Npone      Uterus gravid and non-tender      Abdomen: soft non-tender      Extremities: no significant edema and no signs of DVT          Labs:  GBS Negative. CG/C negative. Wet Prep + BV. Covid -.  CBG (last 3)  Recent Labs    10/22/19 1452 10/22/19 2047 10/23/19 0852  GLUCAP 108* 74 75        Meds: S/P 12 hour magnesium , procardia 10mg  PRN for cxt.   A: [redacted]w[redacted]d with Hospital day # 4 pregnancy at [redacted]w[redacted]d--Kathryn Perry is a 34 y.o. female, G3P1011,  with identical DiDi twins presenting for vaginal bleeding and preterm contractions. S/P 12 hour magnesium for neuroprotection. H/O C/S.  P: Vaginal bleeding: Started 10/18/2019, light first day of menses, new onset episode last night, now [place] minimal spotting. Recommend monitor for 7 days per MFM. Bedrest. Regular diet. Pelvic rest.   Preterm CXT: SVE on 3/31-4/1 was 0/TH/High and no change. S/P 12 hour magnesium, 10mg  procardia PRN, toco continuous if pt feels cxt, MFM recommended if pt begins cxt, then toco, start magnesium 4gm loading and 2gm/hr, indomethacin PO 50mg  then Q6 25mg  PO for tocolysis, if unable to stop labor plan for repeat CS.   Fetal well-being: s/p 12 hour mag for neruoprotectant, US showed, breech, breech babies, Qshift NST reactive for gestational age.  BMZ complete 3/27-28, recuse dose if labor in one week. EFW 2.14llbs 39% for [name] and B is 2.9 lbs 14% on 3/26. Plan for Qshift NST. Plan for magnesium to be started for if labor starts for neuroprotectant.   GDMA1: New problem, failed 1h and 3H GTT on 10/21/2019, 6days post being given BMZ. CBGs WNL, no insulin needed. Low carb diabetic diet, coordinator to assess for caloric intake due to twins. Sliding scale with 2 H PP and FBS to be checked.    GBS:  Negative  BV: Denies s/sx, continue tx with flagyl 500mg  BID x7 days, today day #2.   Anemia: Admission hgb was 9.2, asymptomatic. Start Iron PO today with PO magnesium to prevent constipation.   GERD: Prilosec.   Ike Bene CNM, MSN 10/23/2019 9:06 AM

## 2019-10-24 LAB — GLUCOSE, CAPILLARY
Glucose-Capillary: 103 mg/dL — ABNORMAL HIGH (ref 70–99)
Glucose-Capillary: 129 mg/dL — ABNORMAL HIGH (ref 70–99)
Glucose-Capillary: 95 mg/dL (ref 70–99)
Glucose-Capillary: 118 mg/dL — ABNORMAL HIGH (ref 70–99)

## 2019-10-24 MED ORDER — NIFEDIPINE 10 MG PO CAPS
10.0000 mg | ORAL_CAPSULE | Freq: Four times a day (QID) | ORAL | Status: DC
  Administered 2019-10-24 – 2019-11-06 (×50): 10 mg via ORAL
  Filled 2019-10-24 (×51): qty 1

## 2019-10-24 MED ORDER — SALINE SPRAY 0.65 % NA SOLN
1.0000 | NASAL | Status: DC | PRN
  Administered 2019-10-25: 10:00:00 1 via NASAL
  Filled 2019-10-24: qty 44

## 2019-10-24 MED ORDER — FLUTICASONE PROPIONATE 50 MCG/ACT NA SUSP
1.0000 | Freq: Every day | NASAL | Status: DC
  Administered 2019-10-24 – 2019-11-23 (×30): 1 via NASAL
  Filled 2019-10-24: qty 16

## 2019-10-24 NOTE — Progress Notes (Addendum)
Hospital day # 5 pregnancy at [redacted]w[redacted]d--Kathryn Perry is a 34 y.o. female, G3P1011, IUP at 30.3 weeks, presenting for vaginal bleeding and preterm contractions. S/P 12 hour magnesium for neuroprotection.   S:  Pt resting in bed awake and stable. New episode of bleeding 4/5 at 0400 has now transitioned to [place] spotting on current pad. No further cramping. O: BP 107/61 (BP Location: Right Arm)   Pulse 78   Temp 97.7 F (36.5 C) (Oral)   Resp 18   Ht 5\' 1"  (1.549 m)   Wt 82.6 kg   LMP 03/25/2019   SpO2 100%   BMI 34.39 kg/m       Fetal tracings: Baseline [name] 145s, Baby B 140s, =acels, -decels, reactive for gestational age.       Contractions:   Npone      Uterus gravid and non-tender      Abdomen: soft non-tender      Extremities: no significant edema and no signs of DVT          Labs:  GBS Negative. CG/C negative. Wet Prep + BV. Covid -.  CBG (last 3)  Recent Labs    10/23/19 1112 10/23/19 1449 10/23/19 2108  GLUCAP 93 99 102*        Meds: S/P 12 hour magnesium , procardia 10mg  PRN for cxt.   A: [redacted]w[redacted]d with Hospital day # 5 pregnancy at [redacted]w[redacted]d--Kathryn Perry is a 34 y.o. female, G3P1011,  with identical DiDi twins presenting for vaginal bleeding and preterm contractions. S/P 12 hour magnesium for neuroprotection. H/O C/S.  P: Vaginal bleeding: Started 10/18/2019, light first day of menses, new onset episode on 4/5 at 0400, now [place] minimal spotting. Recommend monitor for 7 days per MFM. Bedrest. Regular diet. Pelvic rest.   Preterm CXT: SVE on 3/31-4/1 was 0/TH/High and no change. S/P 12 hour magnesium, 10mg  procardia PRN, toco continuous if pt feels cxt, MFM recommended if pt begins cxt, then toco, start magnesium 4gm loading and 2gm/hr, indomethacin PO 50mg  then Q6 25mg  PO for tocolysis, if unable to stop labor plan for repeat CS.   Fetal well-being: s/p 12 hour mag for neruoprotectant, US showed, breech, breech babies, Qshift NST reactive for gestational age. BMZ complete 3/27-28,  recuse dose if labor in one week. EFW 2.14llbs 39% for [name] and B is 2.9 lbs 14% on 3/26. Plan for Qshift NST. Plan for magnesium to be started for if labor starts for neuroprotectant.   GDMA1: New problem, failed 1h and 3H GTT on 10/21/2019, 6days post being given BMZ. CBGs WNL, no insulin needed. Low carb diabetic diet, coordinator to assess for caloric intake due to twins. Sliding scale with 2 H PP and FBS to be checked.    GBS:  Negative  BV: Denies s/sx, continue tx with flagyl 500mg  BID x7 days, today day #2.   Anemia: Admission hgb was 9.2, asymptomatic. Start Iron PO today with PO magnesium to prevent constipation.   GERD: Prilosec.   Marveen Reeks [place]rumpler CNM, MSN 10/24/2019 5:05 AM   Pt seen and examined.  Agree with above

## 2019-10-25 LAB — TYPE AND SCREEN
ABO/RH(D): O POS
Antibody Screen: NEGATIVE

## 2019-10-25 LAB — GLUCOSE, CAPILLARY
Glucose-Capillary: 88 mg/dL (ref 70–99)
Glucose-Capillary: 119 mg/dL — ABNORMAL HIGH (ref 70–99)
Glucose-Capillary: 94 mg/dL (ref 70–99)
Glucose-Capillary: 106 mg/dL — ABNORMAL HIGH (ref 70–99)

## 2019-10-25 NOTE — Progress Notes (Addendum)
RN at bedside with morning procardia @0500 . Woke pt to give med and pt stated feeling some contractions. RN palpated abdomen (none felt) and then applied [place] and T[place] for approximately 20 minutes. RN stayed at bedside holding and adjusting [name], however could not determine a baseline on the strip. Pt intermittently falling asleep during monitoring. No further contractions felt during monitoring. Moderate variability and no visible decelerations for [name]. 125 bpm/ moderate variability/ no decelerations for baby B. No UC seen during NST. Pt sleeping.

## 2019-10-25 NOTE — Progress Notes (Signed)
Hospital day #6  pregnancy at [redacted]w[redacted]d--Adalida Kosiba is a 34 y.o. female, G3P1011, IUP at 30.3 weeks, presenting for vaginal bleeding and preterm contractions. S/P 12 hour magnesium for neuroprotection.   S:  Pt resting in bed. States her bleeding is dark [place] every time she uses the restroom. Denies bright red bleeding. Denies leaking of fluid. States yesterday she was having painful contractions but the Procardia schedule was changed and she has only noticed increased cramping right before the next dose is due. Endorses + fetal movement x 2. Pt is tearful and missing her toddler. States she is able to FaceTime with her in the evening but that being away from her is hard.   O: BP 118/60 (BP Location: Left Arm)   Pulse 91   Temp 98.5 F (36.9 C) (Oral)   Resp 17   Ht 5\' 1"  (1.549 m)   Wt 82.6 kg   LMP 03/25/2019   SpO2 98%   BMI 34.39 kg/m       Fetal tracings: Baseline [name] 135s, Baby B 135s, + accels,  No decels, reactive for gestational age.       Contractions:   occasional      Uterus gravid and non-tender      Abdomen: soft non-tender      Extremities: no significant edema and no signs of DVT          Labs:  GBS Negative. CG/C negative. Wet Prep + BV. Covid negative.  CBG (last 3)  Recent Labs    10/24/19 1530 10/24/19 2105 10/25/19 0601  GLUCAP 95 118* 88   Meds: S/P 12 hour magnesium, procardia 10mg  q 6 hours now  A: [redacted]w[redacted]d with Hospital day # 5 pregnancy at [redacted]w[redacted]d--Reah Vasudevan is a 34 y.o. female, G3P1011,  with identical DiDi twins presenting for vaginal bleeding and preterm contractions. S/P 12 hour magnesium for neuroprotection. H/O C/S.  P: Vaginal bleeding: Started 10/18/2019, light first day of menses, new onset episode on 4/5 at 0400, now [place] minimal spotting. Recommend monitor for 7 days per MFM. Bedrest. Regular diet. Pelvic rest.   Preterm CXT: SVE on 3/31-4/1 was 0/TH/High and no change. S/P 12 hour magnesium, 10mg  procardia every 6 hours, toco continuous if pt  feels cxt, MFM recommended if pt begins cxt, then toco, start magnesium 4gm loading and 2gm/hr, indomethacin PO 50mg  then Q6 25mg  PO for tocolysis, if unable to stop labor plan for repeat CS.   Fetal well-being: s/p 12 hour mag for neuroprotectant, US showed, breech, breech babies, Qshift NST reactive for gestational age. BMZ complete 3/27-28, rescue dose if labor in one week. EFW 2.14llbs 39% for [name] and B is 2.9 lbs 14% on 3/26. Plan for Qshift NST. Plan for magnesium to be started for if labor starts for neuroprotection.   GDMA1: New problem, failed 1h and 3H GTT on 10/21/2019, 6days post being given BMZ. CBGs WNL, no insulin needed. Low carb diabetic diet, coordinator to assess for caloric intake due to twins. Sliding scale with 2 H PP and FBS to be checked.  Last fasting BS 88 on 10/25/2019  GBS:  Negative  BV: Denies s/sx, continue tx with flagyl 500mg  BID x7 days, today day #6.   Anemia: Admission hgb was 9.2, asymptomatic. Start Iron PO today with PO magnesium to prevent constipation.   GERD: Prilosec.  [name] CNM, MSN 10/25/2019 8:08 AM

## 2019-10-26 LAB — GLUCOSE, CAPILLARY
Glucose-Capillary: 95 mg/dL (ref 70–99)
Glucose-Capillary: 134 mg/dL — ABNORMAL HIGH (ref 70–99)
Glucose-Capillary: 113 mg/dL — ABNORMAL HIGH (ref 70–99)
Glucose-Capillary: 110 mg/dL — ABNORMAL HIGH (ref 70–99)

## 2019-10-26 MED ORDER — LACTATED RINGERS IV SOLN: INTRAVENOUS | Status: DC

## 2019-10-26 MED ORDER — NIFEDIPINE 10 MG PO CAPS
10.0000 mg | ORAL_CAPSULE | Freq: Once | ORAL | Status: AC
  Administered 2019-10-26: 10 mg via ORAL

## 2019-10-26 MED ORDER — MAGNESIUM SULFATE BOLUS VIA INFUSION
4.0000 g | Freq: Once | INTRAVENOUS | Status: AC
  Administered 2019-10-26: 4 g via INTRAVENOUS
  Filled 2019-10-26: qty 1000

## 2019-10-26 MED ORDER — MAGNESIUM SULFATE 40 GM/1000ML IV SOLN
2.0000 g/h | INTRAVENOUS | Status: AC
  Administered 2019-10-26: 2 g/h via INTRAVENOUS
  Filled 2019-10-26: qty 1000

## 2019-10-26 NOTE — Progress Notes (Signed)
CNM and MD ordered Mag to be started for neuroprotection. Patient's IV was removed previous day. Need to regain IV access prior to admin of MAG.

## 2019-10-26 NOTE — Progress Notes (Addendum)
Hospital day #7  pregnancy at [redacted]w[redacted]d--Kathryn Perry is a 34 y.o. female, G3P1011, IUP at 30.5 weeks, presenting for vaginal bleeding and preterm contractions. S/P 12 hour magnesium for neuroprotection.   S:  Pt resting in bed. States her bleeding is dark [place] every time she uses the restroom. Denies bright red bleeding. Denies leaking of fluid. Endorses random mild cxt, since starting procardia, no regular cxt noted.  Endorses + fetal movement x 2. Mood is stable, but pt endorses starting to miss her daughter and feeling home sick, talked with pt about being able to leave the unit daily for 1 hour in a wheelchair with rn for out door breaks. Pt endorses liking to have that option.  Reviewed POC, pt verbalized understanding, las red bleeding occurred on mondays 10/23/2019.  O: BP 114/62 (BP Location: Right Arm)   Pulse (!) 104   Temp 98.2 F (36.8 C) (Oral)   Resp 18   Ht 5\' 1"  (1.549 m)   Wt 82.6 kg   LMP 03/25/2019   SpO2 99%   BMI 34.39 kg/m       Fetal tracings: Baseline [name] 140s, Baby B 135s, + accels,  No decels, reactive for gestational age @      25 today.       Contractions:   occasional      Uterus gravid and non-tender      Abdomen: soft non-tender      Extremities: no significant edema and no signs of DVT          Labs:  GBS Negative. CG/C negative. Wet Prep + BV. Covid negative.  CBG (last 3)  Recent Labs    10/25/19 2030 10/26/19 0622 10/26/19 0959  GLUCAP 106* 95 134*   Meds: S/P 12 hour magnesium, procardia 10mg  q 6 hours now  A: [redacted]w[redacted]d with Hospital day # 7 pregnancy at [redacted]w[redacted]d--Kathryn Perry is a 34 y.o. female, G3P1011,  with identical DiDi twins presenting for vaginal bleeding and preterm contractions. S/P 12 hour magnesium for neuroprotection. H/O C/S.  P: Vaginal bleeding: Stable, currently [place] spotting only when wiping, Started 10/18/2019, light first day of menses, new onset episode on 4/5 at 0400, now [place] minimal spotting. cervical length noted to be 3.4cm on  4/1,Recommend monitor for 7 days per MFM. Bedrest. Regular diet. Pelvic rest. May be wheeled off unit x 1 hour daily with RN.   Preterm CXT: Stable, random non rhythmic mild cxt noted last night, SVE on 3/31-4/1 was 0/TH/High and no change. S/P 12 hour magnesium, 10mg  procardia every 6 hours, toco continuous if pt feels cxt, MFM recommended if pt begins cxt, then toco, start magnesium 4gm loading and 2gm/hr, indomethacin PO 50mg  then Q6 25mg  PO for tocolysis, if unable to stop labor plan for repeat CS.   Fetal well-being: Stable, asymptomatic, s/p 12 hour mag for neuroprotectant, US showed, breech, breech babies, Qshift NST reactive for gestational age. BMZ complete 3/27-28, rescue dose if labor in one week. EFW 2.14llbs 39% for [name] and B is 2.9 lbs 14% on 3/26. Plan for Qshift NST. Plan for magnesium to be started for if labor starts for neuroprotection.   GDMA1: Stable, asymptomatic, Failed 1h and 3H GTT on 10/21/2019, 6days post being given BMZ. CBGs WNL, no insulin needed. Low carb diabetic diet, coordinator to assess for caloric intake due to twins. Sliding scale with 2 H PP and FBS to be checked.  Last fasting BS this morning was 95.   GBS:  Stable, asymptomatic, Negative.  BV: Stable, asymptomatic, Denies s/sx, continue tx with flagyl 500mg  BID x7 days, today day #6.   Anemia: Stable, asymptomatic, Admission hgb was 9.2, asymptomatic. Continue Iron PO today with PO magnesium to prevent constipation.   GERD: Stable, asymptomatic now, continue Prilosec.   Dr Charlesetta Garibaldi to be updated on pt status  [place], MSN 10/26/2019 10:50 AM

## 2019-10-26 NOTE — Progress Notes (Signed)
Hospital day #7  pregnancy at [redacted]w[redacted]d--Lynsay Taaffe is a 34 y.o. female, G3P1011, IUP at 30.5 weeks, presenting for vaginal bleeding and preterm contractions. S/P 12 hour magnesium for neuroprotection.   S:  Pt was restin gin bed and started to feel cxt again, pt rating them a 5/10, pt did grimace through a cxt while I was present, cxt palpate mild, but checked the pt, her SVE was difficult to tell but under the impression she is now 1cm dilated a change from being closed. She was very soft and anterior. Consulted with Dr Mancel Bale, plan to start 4gm loading bolus of magnesium, talked with pt about rescue dose of BMZ, pt declined dose for now and feels if her cxt stop and doesn't come back she will wait on it but if cxt continue through magnesium then she will rethink if she feels like she is in labor.   O: BP 118/71 (BP Location: Right Arm)   Pulse 96   Temp 98.4 F (36.9 C) (Oral)   Resp 18   Ht 5\' 1"  (1.549 m)   Wt 82.6 kg   LMP 03/25/2019   SpO2 99%   BMI 34.39 kg/m       Fetal tracings: Baseline [name] 140s, Baby B 135s, + accels,  No decels, reactive for gestational age @      78 today.       Contractions:   occasional      Uterus gravid and non-tender      Abdomen: soft non-tender      Extremities: no significant edema and no signs of DVT          Labs:  GBS Negative. CG/C negative. Wet Prep + BV. Covid negative.  CBG (last 3)  Recent Labs    10/26/19 0959 10/26/19 1521 10/26/19 2108  GLUCAP 134* 113* 110*   Meds: S/P 12 hour magnesium, procardia 10mg  q 6 hours now  A: [redacted]w[redacted]d with Hospital day # 7 pregnancy at [redacted]w[redacted]d--Azriella Kennebrew is a 35 y.o. female, G3P1011,  with identical DiDi twins presenting for vaginal bleeding and preterm contractions. S/P 12 hour magnesium for neuroprotection. H/O C/S.  P: Vaginal bleeding: Stable, currently [place] spotting only when wiping, Started 10/18/2019, light first day of menses, new onset episode on 4/5 at 0400, now [place] minimal spotting. cervical  length noted to be 3.4cm on 4/1,Recommend monitor for 7 days per MFM. Bedrest. Regular diet. Pelvic rest. May be wheeled off unit x 1 hour daily with RN.   Preterm CXT: Worsened, pt endorses feeling cxt, irregular, SVE on 3/31-4/1 was 0/TH/High and no change, but today changed noted to 1cm/th/-2 and anterior, cervic was noted to be very soft. S/P 12 hour magnesium upon admission as well, 10mg  procardia every 6 hours, toco continuous if pt feels cxt, MFM recommended if pt begins cxt, then toco, Plan to start magnesium 4gm loading and 2gm/hr, now, will hold on the indomethacin PO 50mg  then Q6 25mg  PO for tocolysis due to all NSAID allergy, if unable to stop labor plan for repeat CS and give resource dose of BMZ..   Fetal well-being: Stable, asymptomatic, s/p 12 hour mag for neuroprotectant from admission, but back on mag currently for tocylysis, US showed, breech, breech babies, Qshift NST reactive for gestational age. BMZ complete 3/27-28, rescue dose if labor in one week. EFW 2.14llbs 39% for [name] and B is 2.9 lbs 14% on 3/26. Plan for Qshift NST. Plan for magnesium to be started for if labor starts  for neuroprotection.   GDMA1: Stable, asymptomatic, Failed 1h and 3H GTT on 10/21/2019, 6days post being given BMZ. CBGs WNL, no insulin needed. Low carb diabetic diet, coordinator to assess for caloric intake due to twins. Sliding scale with 2 H PP and FBS to be checked.  Last fasting BS this morning was 95.   GBS:  Stable, asymptomatic, Negative.  BV: Stable, asymptomatic, Denies s/sx, continue tx with flagyl 500mg  BID x7 days, today day #6.   Anemia: Stable, asymptomatic, Admission hgb was 9.2, asymptomatic. Continue Iron PO today with PO magnesium to prevent constipation.   GERD: Stable, asymptomatic now, continue Prilosec.   Dr Jolayne Panther to be updated on pt status   [place], MSN 10/26/2019 10:07 PM

## 2019-10-27 ENCOUNTER — Inpatient Hospital Stay (HOSPITAL_BASED_OUTPATIENT_CLINIC_OR_DEPARTMENT_OTHER): Payer: BC Managed Care – PPO

## 2019-10-27 DIAGNOSIS — O34219 Maternal care for unspecified type scar from previous cesarean delivery: Secondary | ICD-10-CM

## 2019-10-27 DIAGNOSIS — O4703 False labor before 37 completed weeks of gestation, third trimester: Secondary | ICD-10-CM

## 2019-10-27 DIAGNOSIS — O09523 Supervision of elderly multigravida, third trimester: Secondary | ICD-10-CM | POA: Diagnosis not present

## 2019-10-27 DIAGNOSIS — O30043 Twin pregnancy, dichorionic/diamniotic, third trimester: Secondary | ICD-10-CM | POA: Diagnosis not present

## 2019-10-27 DIAGNOSIS — O4693 Antepartum hemorrhage, unspecified, third trimester: Secondary | ICD-10-CM | POA: Diagnosis not present

## 2019-10-27 DIAGNOSIS — O321XX1 Maternal care for breech presentation, fetus 1: Secondary | ICD-10-CM

## 2019-10-27 DIAGNOSIS — Z3A30 30 weeks gestation of pregnancy: Secondary | ICD-10-CM

## 2019-10-27 LAB — GLUCOSE, CAPILLARY
Glucose-Capillary: 90 mg/dL (ref 70–99)
Glucose-Capillary: 81 mg/dL (ref 70–99)
Glucose-Capillary: 124 mg/dL — ABNORMAL HIGH (ref 70–99)
Glucose-Capillary: 134 mg/dL — ABNORMAL HIGH (ref 70–99)

## 2019-10-27 MED ORDER — MAGNESIUM SULFATE 40 GM/1000ML IV SOLN
2.0000 g/h | INTRAVENOUS | Status: AC
  Administered 2019-10-27: 2 g/h via INTRAVENOUS
  Filled 2019-10-27: qty 1000

## 2019-10-27 MED ORDER — LACTATED RINGERS IV BOLUS
500.0000 mL | Freq: Once | INTRAVENOUS | Status: AC
  Administered 2019-10-27: 19:00:00 500 mL via INTRAVENOUS

## 2019-10-27 MED ORDER — BETAMETHASONE SOD PHOS & ACET 6 (3-3) MG/ML IJ SUSP
12.0000 mg | Freq: Once | INTRAMUSCULAR | Status: AC
  Administered 2019-10-27: 11:00:00 12 mg via INTRAMUSCULAR
  Filled 2019-10-27: qty 5

## 2019-10-27 NOTE — Progress Notes (Signed)
Hospital day #8  Kathryn Perry is a 34 y.o. female, G3P1011, IUP at 30.6 weeks, with di/di twins, presenting for vaginal bleeding and preterm contractions. S/P 12 hour magnesium for neuroprotection.   S:  Pt resting in bed. States the contractions are much better since starting Mag last night. Denies any vaginal bleeding. Endorses + fetal movement x 2.   O: BP 114/69   Pulse (!) 103   Temp 97.7 F (36.5 C) (Oral)   Resp 16   Ht 5\' 1"  (1.549 m)   Wt 82.6 kg   LMP 03/25/2019   SpO2 99%   BMI 34.39 kg/m       Fetal tracings: Baseline [name] 135s, Baby B 135s, + accels,  No decels, reactive for gestational age.       Contractions:   occasional      Uterus gravid and non-tender      Abdomen: soft non-tender      Extremities: no significant edema and no signs of DVT Labs:  GBS Negative. CG/C negative. Wet Prep + BV; 7 days of Flagyl complete. Covid negative.   CBG (last 3)  Recent Labs    10/26/19 1521 10/26/19 2108 10/27/19 0825  GLUCAP 113* 110* 90   Meds: Magnesium 2gm/hr started. Will give rescue dose of betamethasone this morning and turn off the Mag in 24 from start time.   A: Hospital day #8 pregnancy at [redacted]w[redacted]d--Kathryn Perry is a 34 y.o. female, G3P1011,  with identical DiDi twins presenting for vaginal bleeding and preterm contractions. S/P 12 hour magnesium for neuro protection; mag is now restarted due to painful contractions and change in SVE (closed - 1cm). H/O C/S.  P: Vaginal bleeding: Started 10/18/2019, light first day of menses, new onset episode on 4/5 at 0400, now [place] minimal spotting. No bleeding or Jeudy spotting in 24 hours. Recommend monitor for 7 days per MFM. Bedrest. Regular diet. Pelvic rest.   Preterm CXT: SVE on 3/31-4/1 was 0/TH/High. SVE last night 1/50/high per CNM. Restarted magnesium for neuro protection. Will turn Mag off 24 hours after start and give rescue dose of Betamethasone today. If unable to stop labor plan for repeat CS.   Fetal well-being:  Ultrasound today shows [name] breech and baby B Vertex. 6/8 BPPs for both A and B with 2 off for breathing. Qshift NST reactive for gestational age. BMZ complete 3/27-28, rescue dose today. EFW 2.14llbs 39% for [name] and B is 2.9 lbs 14% on 3/26. Plan for Qshift NST.    GDMA1: New problem, failed 1h and 3H GTT on 10/21/2019, 6days post being given BMZ. CBGs WNL, no insulin needed. Low carb diabetic diet, coordinator to assess for caloric intake due to twins. Sliding scale with 2 H PP and FBS to be checked.  Last fasting BS 90 today. Will monitor due to steroid dose today.   GBS:  Negative  BV: Denies s/sx, Flagyl dose complete  Anemia: Admission hgb was 9.2, asymptomatic. Start Iron PO today with PO magnesium to prevent constipation.   GERD: Prilosec.  [name] CNM, MSN 10/27/2019 11:11 AM

## 2019-10-27 NOTE — Progress Notes (Signed)
S: Called by RN pt was sleeping then woke and complained about cxt, irregular, pt endorses they feel a little centralized, denies using the bathroom in the last couple hours, on magnesium in bed. Pt ednorses feeling sleeping and was just sleeping through cxt. Pt appears in NAD and not in labor,   O: BP 115/67 (BP Location: Left Arm)   Pulse 86   Temp 98.5 F (36.9 C) (Oral)   Resp 18   Ht 5\' 1"  (1.549 m)   Wt 82.6 kg   LMP 03/25/2019   SpO2 99%   BMI 34.39 kg/m  SVD: Deferred Abdomen: Cxt irregular palpating mild Toco: Irregular 3-9, lasting 20-60 seconds Fetus A Cat1 Fetus B Ca1  A/P: Preterm cxt, helped pt up to the bathroom, continue mag 2gm/hr, monitor cxt, Dr Mancel Bale consulted and aware of palm.

## 2019-10-27 NOTE — Progress Notes (Addendum)
OB PN:  At bedside for evaluation.  She reports increased thin discharge and more painful contractions- currently rates the pain 6/10.  Still notes some [place] discharge/bleeding.  O: BP 127/66 (BP Location: Left Arm)   Pulse (!) 107   Temp 98.2 F (36.8 C) (Oral)   Resp 18   Ht 5\' 1"  (1.549 m)   Wt 82.6 kg   LMP 03/25/2019   SpO2 98%   BMI 34.39 kg/m   Gen: NAD Abd: gravid, non-tender SSE: Unable to visualize cervix due to redundant tissue- does not apear grossly ruptured, thick bloody-mucus discharge noted.  SVE: FT/long/high  FHT: 130bpm x 2, moderate variability, + 10x10 accels Toco: + irritability, occasional contractions- currently no regular contractions  A: Hospital day #8 pregnancy at [redacted]w[redacted]d--Kathryn Brownis a 34 y.o.female, [id number],  with DiDi twins presenting for vaginal bleeding and preterm contractions  P: Vaginal bleeding: Started 10/18/2019, light first day of menses, new onset episode on 4/5 at 0400, continues to have [place] discharge/spotting.  Recommend monitor for 7 days per MFM. Bedrest. Regular diet. Pelvic rest.   Preterm contractions: SVE this evening remains unchanged.  Plan to stop Mag this evening.  Procardia q6hr prn.  If further cervical change noted will plan to proceed with repeat C-section  Fetal well-being: Ultrasound today shows [name] breech and baby B Vertex. 6/8 BPPs for both A and B with 2 off for breathing. Qshift NST reactive for gestational age. BMZ complete 3/27-28, rescue dose today. EFW 2.14llbs 39% for [name] and B is 2.9 lbs 14% on 3/26. Plan for Qshift NST.    GDMA1: New problem, failed 1h and 3H GTT on 10/21/2019, 6days post being given BMZ. CBGs WNL, no insulin needed. Low carb diabetic diet, coordinator to assess for caloric intake due to twins. Sliding scale with 2 H PP and FBS to be checked.  Last fasting BS 90 today. Will monitor due to steroid dose today.   GBS:  Negative  BV: Denies s/sx, Flagyl dose complete  Anemia:  Admission hgb was 9.2, asymptomatic. Start Iron PO today with PO magnesium to prevent constipation.   GERD: Abrams, DO [phone] (cell) [phone] (office)

## 2019-10-28 DIAGNOSIS — O30003 Twin pregnancy, unspecified number of placenta and unspecified number of amniotic sacs, third trimester: Secondary | ICD-10-CM | POA: Diagnosis present

## 2019-10-28 LAB — GLUCOSE, CAPILLARY
Glucose-Capillary: 90 mg/dL (ref 70–99)
Glucose-Capillary: 121 mg/dL — ABNORMAL HIGH (ref 70–99)
Glucose-Capillary: 136 mg/dL — ABNORMAL HIGH (ref 70–99)
Glucose-Capillary: 121 mg/dL — ABNORMAL HIGH (ref 70–99)

## 2019-10-28 NOTE — Progress Notes (Signed)
Hospital day # 9  -Maitri Brownis a 34 y.o.female, G3P1011, IUP at61w0dweeks, admitted for vaginal bleeding and preterm contractions. S/P repeated 24 hour magnesium for neuroprotection and rescue dose BMZ for episode of increased uterine activity yesterday  S: Currently resting in bed, comfortable, ctx occasional but no pattern and less intense than yesterday. No LOF, notes some [place] spotting today, good FM.  Spouse is present in room and supportive.   O: BP (!) 128/58 (BP Location: Right Arm)   Pulse 100   Temp 98.3 F (36.8 C) (Oral)   Resp 18   Ht 5\' 1"  (1.549 m)   Wt 82.6 kg   LMP 03/25/2019   SpO2 98%   BMI 34.39 kg/m    [id number]: 130 x 2 bpm, Variability: Good {> 6 bpm), Accelerations: Reactive and Decelerations: Absent Toco: rare, mild [id number]: Closed Effacement (%): Thick Cervical Position: Anterior Exam by:: Dr. Nelda Marseille, MD 4/9 evening  A/P- 34 y.o. G3P1011 at [redacted]w[redacted]d admitted with DiDi twin IUP, vaginal bleeding and preterm contractions    Vaginal bleeding: Started 10/18/2019, light first day of menses, new onset episode on 4/5 at 0400, continues to have [place] discharge/spotting.  Recommend monitor for 7 days per MFM. Bedrest. Regular diet. Pelvic rest.   Preterm contractions:SVE 4/9 evening remains unchanged.  Mag Sulfate stopped 2300 after 24 hrs prophylaxis.  Procardia q6hr prn.  If further cervical change noted will plan to proceed with repeat C-section  Fetal well-being:Ultrasound 4/9 shows [name] breech and baby B Vertex. 6/8 BPPs for both A and B with 2 off for breathing.Qshift NST reactive for gestational age. BMZ complete 3/27-28, rescue dose4/9. EFW 2.14llbs 39% for [name] and B is 2.9 lbs 14% on 3/26.  Plan for Qshift NST.   GDMA1:New problem, failed 1h and 3H GTT on 10/21/2019, 6days post being given BMZ. CBGs WNL, no insulin needed. Low carb diabetic diet, coordinator to assess for caloric intake due to twins. Sliding scale with 2 H PP and FBS to  be checked. Last fasting BS 90 today. Anticipate increase in CBG's over next few days in response to steroids, treat PRN.  GBS: Negative  BV: Denies s/sx,Flagyl dose complete  Anemia: Admission hgb was 9.2, asymptomatic. Continues Iron PO with PO magnesium to prevent constipation.   GERD: Prilosec  [name], CNM, MSN 10/28/2019, 8:54 AM

## 2019-10-29 LAB — TYPE AND SCREEN
ABO/RH(D): O POS
Antibody Screen: NEGATIVE

## 2019-10-29 LAB — GLUCOSE, CAPILLARY
Glucose-Capillary: 68 mg/dL — ABNORMAL LOW (ref 70–99)
Glucose-Capillary: 94 mg/dL (ref 70–99)
Glucose-Capillary: 95 mg/dL (ref 70–99)
Glucose-Capillary: 86 mg/dL (ref 70–99)
Glucose-Capillary: 101 mg/dL — ABNORMAL HIGH (ref 70–99)

## 2019-10-29 MED ORDER — SODIUM CHLORIDE 0.9% FLUSH
3.0000 mL | Freq: Two times a day (BID) | INTRAVENOUS | Status: DC
  Administered 2019-10-29 – 2019-11-23 (×47): 3 mL via INTRAVENOUS

## 2019-10-29 NOTE — Progress Notes (Signed)
Pt requesting to discontinue Toco monitor. Pt denies feeling uc's. Gavin Potters, CNM given update on uterine activity. Pt instructed to notify RN if uc's increase in frequency or strength.

## 2019-10-29 NOTE — Progress Notes (Signed)
Hospital day # 10  -Kathryn Brownis a 34 y.o.female, G3P1011, IUP at50w1dweeks, admitted for vaginal bleeding and preterm contractions. S/P repeated 24 hour magnesium for neuroprotection and rescue dose BMZ for episode of increased uterine activity 2 days ago.   S: Currently resting in bed. States she has increased pelvic pressure when she has contractions. Feels like the contractions are more intense just prior to next dose of Procardia. No LOF. States she still having thing [place] discharge when she uses the restroom. Endorses + fetal movement x 2. Spouse is present in room and supportive.   O: BP 114/71 (BP Location: Right Arm)   Pulse 93   Temp 98.3 F (36.8 C) (Oral)   Resp 18   Ht 5\' 1"  (1.549 m)   Wt 82.6 kg   LMP 03/25/2019   SpO2 99%   BMI 34.39 kg/m    [id number]: 130 x 2 bpm, Variability: Good {> 6 bpm), Accelerations: Reactive and Decelerations: Absent Toco: rare, mild [id number]: Closed Effacement (%): Thick Cervical Position: Anterior Exam by:: Dr. Nelda Marseille, MD 4/9 evening  A/P- 34 y.o. G3P1011 at [redacted]w[redacted]d admitted with DiDi twin IUP, vaginal bleeding and preterm contractions    Vaginal bleeding: Started 10/18/2019, light first day of menses, resolved to [place] spotting. New onset bright red bleeding on 4/5 at 0400, continues to have [place] discharge/spotting. On 4/9, noted to have bright red bleeding on SVE, now thin [place] discharge. Recommend monitor for 7 days per MFM. Bedrest. Pelvic rest.   Preterm contractions:SVE 4/9 evening remains unchanged. S/P repeat of Mag Sulfate for 24 hours, stopped 4/9 @ 2300. Procardia q6hr.  If further cervical change noted will plan to proceed with repeat C-section.  Fetal well-being:Ultrasound 4/9 shows [name] breech and baby B Vertex. 6/8 BPPs for both A and B with 2 off for breathing.Qshift NST reactive for gestational age. BMZ complete 3/27-28, rescue dose4/9. EFW 2.14llbs 39% for [name] and B is 2.9 lbs 14% on 3/26. Plan for Qshift  NST.   GDMA1:New problem, failed 1h and 3H GTT on 10/21/2019, 6days post being given BMZ. Low carb diabetic diet, coordinator to assess for caloric intake due to twins. Sliding scale with 2h PP and FBS to be checked. Last fasting BS 68 today. Anticipate increase in CBG's over next few days in response to steroids, treat PRN.  GBS: Negative  BV: Denies s/sx,Flagyl dose complete  Anemia: Admission hgb was 9.2, asymptomatic. Continues Iron PO with PO magnesium to prevent constipation.   GERD: Prilosec  [name], MSN 10/29/2019, 11:41 AM

## 2019-10-30 LAB — CBC WITH DIFFERENTIAL/PLATELET
Abs Immature Granulocytes: 0.06 10*3/uL (ref 0.00–0.07)
Basophils Absolute: 0 10*3/uL (ref 0.0–0.1)
Basophils Relative: 0 %
Eosinophils Absolute: 0 10*3/uL (ref 0.0–0.5)
Eosinophils Relative: 1 %
HCT: 29.2 % — ABNORMAL LOW (ref 36.0–46.0)
Hemoglobin: 9.2 g/dL — ABNORMAL LOW (ref 12.0–15.0)
Immature Granulocytes: 1 %
Lymphocytes Relative: 37 %
Lymphs Abs: 2.1 10*3/uL (ref 0.7–4.0)
MCH: 28.6 pg (ref 26.0–34.0)
MCHC: 31.5 g/dL (ref 30.0–36.0)
MCV: 90.7 fL (ref 80.0–100.0)
Monocytes Absolute: 0.7 10*3/uL (ref 0.1–1.0)
Monocytes Relative: 13 %
Neutro Abs: 2.7 10*3/uL (ref 1.7–7.7)
Neutrophils Relative %: 48 %
Platelets: 198 10*3/uL (ref 150–400)
RBC: 3.22 MIL/uL — ABNORMAL LOW (ref 3.87–5.11)
RDW: 15.2 % (ref 11.5–15.5)
WBC: 5.6 10*3/uL (ref 4.0–10.5)
nRBC: 0 % (ref 0.0–0.2)

## 2019-10-30 LAB — GLUCOSE, CAPILLARY
Glucose-Capillary: 92 mg/dL (ref 70–99)
Glucose-Capillary: 103 mg/dL — ABNORMAL HIGH (ref 70–99)
Glucose-Capillary: 85 mg/dL (ref 70–99)
Glucose-Capillary: 78 mg/dL (ref 70–99)

## 2019-10-30 MED ORDER — CETIRIZINE HCL 10 MG PO TABS
10.0000 mg | ORAL_TABLET | Freq: Every day | ORAL | Status: DC
  Administered 2019-10-31 – 2019-11-23 (×24): 10 mg via ORAL
  Filled 2019-10-30 (×25): qty 1

## 2019-10-30 NOTE — Progress Notes (Addendum)
Hospital day # 11 pregnancy at [redacted]w[redacted]d--Kathryn Perry is a 34 y.o. female, G3P1011, IUP at 31.2 weeks, presenting for vaginal bleeding and preterm contractions. S/P magnesium x 2 courses and rescue dose of BMZ 4/9. She continues to take procardia 10 mg q 6.  S:  Pt resting in bed awake and stable, eating breakfast. Pt denies any contractions and notes that Kathryn Perry old blood is noted when she wipes.    O: BP 118/60 (BP Location: Right Arm)   Pulse 94   Temp 98.4 F (36.9 C) (Oral)   Resp 18   Ht 5\' 1"  (1.549 m)   Wt 82.6 kg   LMP 03/25/2019   SpO2 98%   BMI 34.39 kg/m       Fetal tracings: Baseline [name] 145s, Baby B 140s, =acels, -decels, reactive for gestational age.       Contractions:   Npone      Uterus gravid and non-tender      Abdomen: soft non-tender      Extremities: no significant edema and no signs of DVT          Labs:  GBS Negative.  CBG (last 3)  Recent Labs    10/29/19 1537 10/29/19 2046 10/30/19 0618  GLUCAP 86 101* 92    A: [redacted]w[redacted]d with Hospital day # Kathryn Perry is a 33 y.o. female, G3P1011,  with identical DiDi twins presenting for vaginal bleeding and preterm contractions. Previous CS.  P: Vaginal bleeding: Started 10/18/2019 now [place] minimal spotting. Recommend monitor for 7 days per MFM. Bedrest. Diabetic diet. Pelvic rest.   Preterm CXT: SVE on 4/11 FT/Thick  S/P magnesium x 2 and rescue dose of BMZ, 10mg  procardia q 6 hours, toco PRN contractions, if unable to stop labor plan for repeat CS.   Fetal well-being: s/p  mag for neuroprotectant x2, US showed, breech/vtx, Qshift NST reactive for gestational age. BMZ complete 3/27-28, recuse dose 4/9. EFW 2.14llbs 39% for [name] and B is 2.9 lbs 14% on 3/26. Plan for Qshift NST. Consider growth scan at 32 weeks.  GDMA1: Sliding scale with 2 H PP and FBS to be checked.    GBS:  Negative  Anemia: CBC pending today     Ike Bene CNM, MSN 10/30/2019 8:41 AM  Attestation of Attending Supervision of  Advanced Practitioner (CNM/NP): Evaluation and management procedures were performed by the Advanced Practitioner under my supervision and collaboration.  I have reviewed the Advanced Practitioner's note and chart, and I agree with the management and plan.  I saw and examined patient at bedside and agree with above findings, assessment and plan as outlined above by Kathryn Perry. On peripad exam there is a small amount of pink, [place] discharge.  Patient reports contractions through out the day (four in an hour) and usually worsens before her procardia is due, this is no change from her baseline.  Discussed plan of care. Continue with procardia for tocolysis and comfort.  Dr. Alesia Richards.  10/30/2019.

## 2019-10-31 LAB — GLUCOSE, CAPILLARY
Glucose-Capillary: 73 mg/dL (ref 70–99)
Glucose-Capillary: 131 mg/dL — ABNORMAL HIGH (ref 70–99)
Glucose-Capillary: 81 mg/dL (ref 70–99)
Glucose-Capillary: 117 mg/dL — ABNORMAL HIGH (ref 70–99)

## 2019-10-31 MED ORDER — MAGNESIUM OXIDE 400 (241.3 MG) MG PO TABS
400.0000 mg | ORAL_TABLET | Freq: Every day | ORAL | Status: DC
  Administered 2019-11-01 – 2019-11-23 (×22): 400 mg via ORAL
  Filled 2019-10-31 (×23): qty 1

## 2019-10-31 MED ORDER — POLYSACCHARIDE IRON COMPLEX 150 MG PO CAPS
150.0000 mg | ORAL_CAPSULE | Freq: Two times a day (BID) | ORAL | Status: DC
  Administered 2019-10-31 – 2019-11-23 (×45): 150 mg via ORAL
  Filled 2019-10-31 (×46): qty 1

## 2019-10-31 MED ORDER — LACTATED RINGERS IV BOLUS
300.0000 mL | Freq: Once | INTRAVENOUS | Status: AC
  Administered 2019-10-31: 300 mL via INTRAVENOUS

## 2019-10-31 NOTE — Progress Notes (Addendum)
Patient ID: Kathryn Perry, female   DOB: 06-Nov-1985, 34 y.o.   MRN: [id number]  Hospital day # 12  pregnancy at [redacted]w[redacted]d  -Kathryn Perry a 34 y.o.female, G3P1011, IUP at31.3weeks, presenting for vaginal bleeding and preterm contractions. S/P magnesium x 2 courses and rescue dose of BMZ 4/9. She continues to take procardia 10 mg q 6.  S: Resting in bed, notes ctx have been more frequent in recent days and stronger, [place] spotting consistent las couple days with occasional red streaks. Good FM.  Desires to forego IV site replacement if possible, has been needing daily new IV placement d/t frequent clogging. Advised we can have IV team come assess for placement and avoid failed IV sites as much as we can. Agrees.   O:  Today's Vitals   10/31/19 0800 10/31/19 0802 10/31/19 0950 10/31/19 1030  BP:  118/68    Pulse:  85    Resp:  18    Temp:  98.3 F (36.8 C)    TempSrc:  Oral    SpO2:  99%    Weight:      Height:      PainSc: 0-No pain  0-No pain 0-No pain   Body mass index is 34.39 kg/m.        Fetal tracings: 130's x 3, + accels, no decels      Ctx q 6-9 min, mild      Uterus gravid and non-tender      Abdomen: soft non-tender      Extremities: no significant edema and no signs of DVT           CBC Latest Ref Rng & Units 10/30/2019 10/18/2019 08/21/2019  WBC 4.0 - 10.5 K/uL 5.6 8.0 8.1  Hemoglobin 12.0 - 15.0 g/dL 9.2(L) 9.2(L) 9.4(L)  Hematocrit 36.0 - 46.0 % 29.2(L) 28.9(L) 28.1(L)  Platelets 150 - 400 K/uL 198 209 214    CBG (last 3)  Recent Labs    10/30/19 2052 10/31/19 0620 10/31/19 1026  GLUCAP 78 73 131*   A: [redacted]w[redacted]d with Hospital day # [place] a 34 y.o.female, G3P1011,  with identical DiDi twins presenting for vaginal bleeding and preterm contractions. Previous CS.  P: Vaginal bleeding: Started 10/18/2019 now [place] minimal spotting. Recommend monitor for 7 days per MFM. Bedrest. Diabetic diet. Pelvic rest.   Preterm CXT: SVE on 4/11 FT/Thick  S/P magnesium  x 2 and rescue dose of BMZ, 10mg  procardia q 6 hours, toco PRN contractions, if unable to stop labor plan for repeat CS.   Fetal well-being: s/p  mag for neuroprotectant x2, US showed, breech/vtx, Qshift NST reactive for gestational age. BMZ complete 3/27-28, rescue dose 4/9. EFW 2.14llbs 39% for [name] and B is 2.9 lbs 14% on 3/26. Plan for Qshift NST. Repeat growth 32 wks.  GDMA1: Sliding scale with 2 H PP and FBS to be checked.    GBS:  Negative           Anemia: Hgb 9.2 yesterday, will increase oral Fe and start Bryan, MSN, Kathryn 10/31/2019, 11:19 AM Attestation of Attending Supervision of Advanced Practitioner (Kathryn/NP): Evaluation and management procedures were performed by the Advanced Practitioner under my supervision and collaboration.  I have reviewed the Advanced Practitioner's note and chart, and I agree with the management and plan.  I saw and examined patient at bedside and agree with above findings, assessment and plan as outlined above by Kathryn Perry.  Patient feeling contractions  every 6 to 7 minutes, TOCO tracing them. She just received procardia, and IVfluid bolus, offered to check cervix, she declines but will allow cervical check if the contractions do not improve.  Dr. Alesia Richards.  10/31/2019. 1845.

## 2019-11-01 LAB — GLUCOSE, CAPILLARY
Glucose-Capillary: 95 mg/dL (ref 70–99)
Glucose-Capillary: 102 mg/dL — ABNORMAL HIGH (ref 70–99)
Glucose-Capillary: 109 mg/dL — ABNORMAL HIGH (ref 70–99)
Glucose-Capillary: 91 mg/dL (ref 70–99)

## 2019-11-01 LAB — TYPE AND SCREEN
ABO/RH(D): O POS
Antibody Screen: NEGATIVE

## 2019-11-01 NOTE — Progress Notes (Addendum)
Patient ID: Berdene Luskin, female   DOB: May 01, 1986, 34 y.o.   MRN: [id number]  Hospital day # 13  pregnancy at [redacted]w[redacted]d  -Jaleea Brownis a 34 y.o.female, G3P1011, IUP of di/di twins at31.4weeks, presenting for vaginal bleeding and preterm contractions. S/P magnesium x 2 courses and rescue dose of BMZ 4/9. Procardia 10 mg q 6 hours.  S: Resting in bed. Comfortable. States she has increased contractions at night that subside by morning. States she still has [place] discharge when using the restroom. Spirits are good, states she is happy to be almost 32 weeks.   O:  Today's Vitals   11/01/19 0753 11/01/19 0905 11/01/19 1030 11/01/19 1201  BP: (!) 110/58   111/70  Pulse: (!) 104   100  Resp: 18     Temp:      TempSrc:      SpO2: 99%     Weight:      Height:      PainSc:  0-No pain 0-No pain    Body mass index is 34.39 kg/m.        NST: [name] - 130s, + accels, no decels, moderate variability               Baby B - 130s, + accels, no decels, moderate variability      Ctx occasional      Uterus gravid and non-tender      Abdomen: soft non-tender      Extremities: no significant edema and no signs of DVT           CBC Latest Ref Rng & Units 10/30/2019 10/18/2019 08/21/2019  WBC 4.0 - 10.5 K/uL 5.6 8.0 8.1  Hemoglobin 12.0 - 15.0 g/dL 9.2(L) 9.2(L) 9.4(L)  Hematocrit 36.0 - 46.0 % 29.2(L) 28.9(L) 28.1(L)  Platelets 150 - 400 K/uL 198 209 214    CBG (last 3)  Recent Labs    10/31/19 2133 11/01/19 0621 11/01/19 1037  GLUCAP 117* 95 102*   A: [redacted]w[redacted]d with Hospital day # [place] a 34 y.o.female, G3P1011,  with identical DiDi twins presenting for vaginal bleeding and preterm contractions. Previous CS.  P: Vaginal bleeding: Started 10/18/2019 now [place] minimal spotting. Recommend monitor for 7 days per MFM. Bedrest. Diabetic diet. Pelvic rest.   Preterm CXT: SVE on 4/11 FT/Thick  S/P magnesium x 2 and rescue dose of BMZ, 10mg  procardia q 6 hours, toco PRN contractions, if unable  to stop labor plan for repeat CS.   Fetal well-being: s/p  mag for neuroprotectant x2, US showed, breech/vtx, Qshift NST reactive for gestational age. BMZ complete 3/27-28, rescue dose 4/9. EFW 2.14llbs 39% for [name] and B is 2.9 lbs 14% on 3/26. Plan for Qshift NST. Repeat growth 32 wks.  GDMA2: CBGs 2 hour PP and FBS to be checked. Insulin sliding scale prn.  GBS:  Negative          Anemia: Hgb 9.2 on 4/12, Oral iron and MagOx  [name], MSN, CNM 11/01/2019, 2:46 PM  Not feeling any ctxs at about 1730.  NST x 2 cat 1.  Reports thicker and slightly heavier [place] discharge but no active bleeding.  Exam deferred to minimize exams but will examine pt if becomes uncomfortable.  No calf tenderness.  Abd soft, NT.

## 2019-11-02 LAB — GLUCOSE, CAPILLARY
Glucose-Capillary: 73 mg/dL (ref 70–99)
Glucose-Capillary: 104 mg/dL — ABNORMAL HIGH (ref 70–99)
Glucose-Capillary: 86 mg/dL (ref 70–99)
Glucose-Capillary: 126 mg/dL — ABNORMAL HIGH (ref 70–99)
Glucose-Capillary: 125 mg/dL — ABNORMAL HIGH (ref 70–99)

## 2019-11-02 NOTE — Progress Notes (Addendum)
Patient ID: Manar Funches, female   DOB: 1985/12/30, 34 y.o.   MRN: [id number]  Hospital day # 14  pregnancy at [redacted]w[redacted]d  -Journii Brownis a 34 y.o.female, G3P1011, IUP of di/di twins at31.5weeks, presenting for vaginal bleeding and preterm contractions. S/P magnesium x 2 courses and rescue dose of BMZ 4/9. Procardia 10 mg q 6 hours.   S: Pt sitting in bed after breakfast in NAD.  States she still has [place] discharge when using the restroom. Pt denies feeling cxt over the night.  Spirits are good, states she is happy to be almost 32 weeks. +FM, no other complaints, review POC pt verbalized understanding without questions.   O:  Today's Vitals   11/02/19 0938 11/02/19 1051 11/02/19 1129 11/02/19 1203  BP:    117/69  Pulse:    99  Resp:    18  Temp:    98.6 F (37 C)  TempSrc:    Oral  SpO2:    99%  Weight:      Height:      PainSc: 0-No pain 0-No pain 0-No pain    Body mass index is 34.39 kg/m.        NST: [name] - 140s, + accels, no decels, moderate variability               Baby B - 150s, + accels, no decels, moderate variability      Ctx occasional      Uterus gravid and non-tender      Abdomen: soft non-tender      Extremities: no significant edema and no signs of DVT           CBC Latest Ref Rng & Units 10/30/2019 10/18/2019 08/21/2019  WBC 4.0 - 10.5 K/uL 5.6 8.0 8.1  Hemoglobin 12.0 - 15.0 g/dL 9.2(L) 9.2(L) 9.4(L)  Hematocrit 36.0 - 46.0 % 29.2(L) 28.9(L) 28.1(L)  Platelets 150 - 400 K/uL 198 209 214    CBG (last 3)  Recent Labs    11/01/19 2148 11/02/19 0654 11/02/19 1052  GLUCAP 91 73 104*   A: [redacted]w[redacted]d with Hospital day # [place] a 34 y.o.female, G3P1011,  with identical DiDi twins presenting for vaginal bleeding and preterm contractions. S/P 12 hour magnesium for neuroprotection. H/O C/S.   P: Vaginal bleeding: Stable, currently [place] spotting only when wiping, Started 10/18/2019, light first day of menses, new onset episode on 4/5 at 0400, now [place]  minimal spotting. cervical length noted to be 3.4cm on 4/1,Recommend monitor for 7 days per MFM. Bedrest. Regular diet. Pelvic rest. May be wheeled off unit x 1 hour daily with RN.   Preterm CXT: Stable no cxt over night, SVE on 4/11 FT/Thick  S/P magnesium x 2 and rescue dose of BMZ, 10mg  procardia q 6 hours, toco PRN contractions, if unable to stop labor plan for repeat CS. Plan for mag for tocolysis prior to 32 weeks.   Fetal well-being: s/p  mag for neuroprotectant x2, US showed, breech/vtx, Qshift NST reactive for gestational age. BMZ complete 3/27-28, rescue dose 4/9. EFW 2.14llbs 39% for [name] and B is 2.9 lbs 14% on 3/26. Plan for Qshift NST. Repeat growth 32 wks. Plan to start magnesium 4gm loading and 2gm/hr, if labors.   GDMA2: FBS this am was 73, not requiring isulin sliding scale, stable CBGs 2 hour PP and FBS to be checked. Insulin sliding scale prn. Diabetic diet.   GBS:  Negative  Anemia: Hgb 9.2 on 4/12, Oral iron and MagOx  [facility], MSN, CNM 11/02/2019, 12:56 PM  I reviewed chart and agree with above findings, assessment and plan as outlined above by [facility].  Consider magnesium sulfate 4g IV load then 1 g/hr for fetal neuro protection as per Uptodate recommendations. Dr. Alesia Richards. 11/02/2019.

## 2019-11-03 ENCOUNTER — Inpatient Hospital Stay (HOSPITAL_COMMUNITY): Payer: BC Managed Care – PPO

## 2019-11-03 ENCOUNTER — Inpatient Hospital Stay (HOSPITAL_BASED_OUTPATIENT_CLINIC_OR_DEPARTMENT_OTHER): Payer: BC Managed Care – PPO

## 2019-11-03 DIAGNOSIS — Z3A31 31 weeks gestation of pregnancy: Secondary | ICD-10-CM

## 2019-11-03 DIAGNOSIS — O30043 Twin pregnancy, dichorionic/diamniotic, third trimester: Secondary | ICD-10-CM | POA: Diagnosis not present

## 2019-11-03 DIAGNOSIS — O34219 Maternal care for unspecified type scar from previous cesarean delivery: Secondary | ICD-10-CM

## 2019-11-03 DIAGNOSIS — O4693 Antepartum hemorrhage, unspecified, third trimester: Secondary | ICD-10-CM

## 2019-11-03 DIAGNOSIS — O322XX2 Maternal care for transverse and oblique lie, fetus 2: Secondary | ICD-10-CM

## 2019-11-03 DIAGNOSIS — O321XX1 Maternal care for breech presentation, fetus 1: Secondary | ICD-10-CM

## 2019-11-03 DIAGNOSIS — Z362 Encounter for other antenatal screening follow-up: Secondary | ICD-10-CM

## 2019-11-03 DIAGNOSIS — O4703 False labor before 37 completed weeks of gestation, third trimester: Secondary | ICD-10-CM

## 2019-11-03 LAB — GLUCOSE, CAPILLARY
Glucose-Capillary: 79 mg/dL (ref 70–99)
Glucose-Capillary: 109 mg/dL — ABNORMAL HIGH (ref 70–99)
Glucose-Capillary: 106 mg/dL — ABNORMAL HIGH (ref 70–99)
Glucose-Capillary: 104 mg/dL — ABNORMAL HIGH (ref 70–99)

## 2019-11-03 NOTE — Progress Notes (Signed)
Hospital day #15 pregnancy at [redacted]w[redacted]d  -Kathryn Brownis a 34 y.o.female, G3P1011, IUP of di/di twins at31.6weeks, presenting for vaginal bleeding and preterm contractions. S/P magnesiumx 2 courses and rescue dose of BMZ 4/9. Procardia 10 mg q 6 hours.   S: States she still has [place] discharge when using the restroom. Pt denies feeling cxt over the night.  Spirits are good, states she is happy to be almost 32 weeks. +FM, no other complaints, review POC pt verbalized understanding without questions.   O:       Today's Vitals   11/02/19 0938 11/02/19 1051 11/02/19 1129 11/02/19 1203  BP:    117/69  Pulse:    99  Resp:    18  Temp:    98.6 F (37 C)  TempSrc:    Oral  SpO2:    99%  Weight:      Height:      PainSc: 0-No pain 0-No pain 0-No pain    Body mass index is 34.39 kg/m.      NST: [name] - 140s, + accels, no decels, moderate variability               Baby B - 150s, + accels, no decels, moderate variability      Ctx occasional Uterus gravid and non-tender Abdomen: soft non-tender Extremities: no significant edema and no signs of DVT   CBC Latest Ref Rng & Units 10/30/2019 10/18/2019 08/21/2019  WBC 4.0 - 10.5 K/uL 5.6 8.0 8.1  Hemoglobin 12.0 - 15.0 g/dL 9.2(L) 9.2(L) 9.4(L)  Hematocrit 36.0 - 46.0 % 29.2(L) 28.9(L) 28.1(L)  Platelets 150 - 400 K/uL 198 209 214    CBG (last 3)  Recent Labs (last 2 labs)        Recent Labs    11/01/19 2148 11/02/19 0654 11/02/19 1052  GLUCAP 91 73 104*     A: [redacted]w[redacted]d with Hospital day #14E[place] Brownis a 34 y.o.female, G3P1011, with identical DiDi twins presenting for vaginal bleeding and preterm contractions. S/P 12 hour magnesium for neuroprotection. H/O C/S.  P: Vaginal bleeding: Stable, currently [place] spotting only when wiping, Started 10/18/2019, light first day of menses, new onset episode on 4/5 at 0400, now [place] minimal spotting. cervical length noted to be  3.4cm on 4/1,Recommend monitor for 7 days per MFM. Bedrest. Regular diet. Pelvic rest. May be wheeled off unit x 1 hour daily with RN.   Preterm [id number] no cxt over night, SVE on 4/11 FT/ThickS/P magnesium x 2 and rescue dose of BMZ, 10mg  procardiaq 6 hours, tocoPRN contractions, if unable to stop laborplan for repeat CS. Plan for mag for tocolysis prior to 32 weeks.   Fetal well-being:s/p mag for neuroprotectantx2, US showed, breech/vtx, Qshift NST reactive for gestational age. BMZ complete 3/27-28, rescue dose4/9. EFW 2.14llbs 39% for [name] and B is 2.9 lbs 14% on 3/26. Plan for Qshift NST.Repeat growth 32 wks. Plan tostart magnesium 4gm loading and 2gm/hr, if labors.   GDMA2:FBS wnl not requiring isulin sliding scale, stable CBGs 2 hour PP and FBS to be checked. Insulin sliding scale prn. Diabetic diet.   GBS: Negative          Anemia: Hgb 9.2 on 4/12, Oral iron and MagOx

## 2019-11-03 NOTE — Consult Note (Signed)
MFM Note  Kathryn Perry was seen for consultation today due to a monozygotic dichorionic, diamniotic twin gestation at 76 weeks and 6 days.  She has been hospitalized for the past 2 weeks due to vaginal bleeding.  Her last episode of bleeding occurred about 10 days ago.  The patient currently reports some [place] discharge when wiping after using the bathroom.  Due to contractions along with the vaginal spotting, she was placed on Procardia 10 mg every 6 hours for tocolysis.  Her most recent cervical exam showed that her cervix was closed.  She has already received 2 courses (initial and rescue) of antenatal corticosteroids.    The patient reports that she still feels about 4-5 contractions per hour despite being treated with Procardia. She reports fetal movements x2.  Her fetal status has been reassuring x2.  She had an ultrasound performed today which showed an EFW for twin A of 4 pounds 4 ounces (50th percentile for her gestational age).   The EFW for twin B pounds 6 ounces (6 percentile for her gestational age), indicating possible fetal growth restriction of twin B.  The placenta looked normal without evidence of abruption.  Doppler studies of the umbilical arteries performed for Twin B showed an elevated S/D ratio of 5.49.  There were no signs of absent or reversed end-diastolic flow noted in twin B.  Due to her continued Victor vaginal spotting, the frequent contractions that she is feeling, growth restriction of twin B, and the fact that the patient reports that she would have a difficult time coming back to the hospital due to childcare issues should she have another bleed, I would recommend that she continue inpatient management until 34 weeks.  Due to fetal growth restriction noted in twin B, she should have weekly umbilical artery Doppler studies performed while she is in the hospital.  Another growth ultrasound may be performed in 2 to 3 weeks.  As the patient will be 32 weeks tomorrow, she  will not need any further magnesium sulfate for fetal neuro protection.  As she has already received 2 courses of antenatal corticosteroids, no further steroid courses are indicated prior to delivery.  The patient was advised that the cause of her bright red vaginal bleeding remains undetermined.  A chronic abruption has not been ruled out.  Should she have another episode of bright red bleeding at 34 weeks or greater, delivery should be considered.    The patient reports that she is comfortable with this management plan.    At the end of the consultation, the patient stated that all of her questions have been answered to her complete satisfaction.    Thank you for referring this patient for a Maternal-Fetal Medicine consultation.  Recommendations:  Continue inpatient management until 34 weeks with daily fetal testing  Continue Procardia until 34 weeks  Weekly umbilical artery Doppler studies and fluid checks  Repeat growth ultrasound in 2 to 3 weeks  Consider delivery should she experience another episode of bright red vaginal bleeding at 34 weeks or greater  No further courses of antenatal corticosteroids or magnesium sulfate are indicated

## 2019-11-04 LAB — TYPE AND SCREEN
ABO/RH(D): O POS
Antibody Screen: NEGATIVE

## 2019-11-04 LAB — GLUCOSE, CAPILLARY
Glucose-Capillary: 115 mg/dL — ABNORMAL HIGH (ref 70–99)
Glucose-Capillary: 66 mg/dL — ABNORMAL LOW (ref 70–99)
Glucose-Capillary: 105 mg/dL — ABNORMAL HIGH (ref 70–99)
Glucose-Capillary: 88 mg/dL (ref 70–99)
Glucose-Capillary: 99 mg/dL (ref 70–99)

## 2019-11-04 NOTE — Progress Notes (Signed)
Patient ID: Lasean Salcido, female   DOB: [birth year], 34 y.o.   MRN: [id number]  Hospital day # 16  pregnancy at [redacted]w[redacted]d  -Genee Brownis a 34 y.o.female, G3P1011, IUP of di/di twins at32weeks, presenting for vaginal bleeding and preterm contractions. S/P magnesium x 2 courses and rescue dose of BMZ 4/9. Procardia 10 mg q 6 hours.   S: Pt sitting in bed after breakfast in NAD.  States she still has [place] discharge when using the restroom. Pt denies feeling cxt over the night. Denies any leakage of fluids. Spirits are good, states she is happy to be 32 weeks. +FM, no other complaints, review POC pt verbalized understanding without questions.   O:  Today's Vitals   11/04/19 0410 11/04/19 0647 11/04/19 0800 11/04/19 1140  BP:  (!) 108/52  108/65  Pulse:  88  97  Resp:  18  18  Temp:  98.2 F (36.8 C)  98.4 F (36.9 C)  TempSrc:  Oral  Oral  SpO2:  100%  98%  Weight:      Height:      PainSc: Asleep  0-No pain    Body mass index is 34.39 kg/m.        NST: [name] - 145s, + accels, no decels, moderate variability               Baby B - 145s, + accels, no decels, moderate variability      Ctx occasional      Uterus gravid and non-tender      Abdomen: soft non-tender      Extremities: no significant edema and no signs of DVT           CBC Latest Ref Rng & Units 10/30/2019 10/18/2019 08/21/2019  WBC 4.0 - 10.5 K/uL 5.6 8.0 8.1  Hemoglobin 12.0 - 15.0 g/dL 9.2(L) 9.2(L) 9.4(L)  Hematocrit 36.0 - 46.0 % 29.2(L) 28.9(L) 28.1(L)  Platelets 150 - 400 K/uL 198 209 214    CBG (last 3)  Recent Labs    11/04/19 0645 11/04/19 0727 11/04/19 1051  GLUCAP 66* 115* 105*   A: 32w with Hospital day # [place] a 34 y.o.female, G3P1011,  with identical DiDi twins presenting for vaginal bleeding and preterm contractions. S/P 12 hour magnesium for neuroprotection. H/O C/S.   P: Vaginal bleeding: Stable, currently [place] spotting only when wiping, Started 10/18/2019, light first day of menses,  new onset episode on 4/5 at 0400, now [place] minimal spotting. cervical length noted to be 3.4cm on 4/1, New recommendation due to recent US per MF to delivery if any red blood noted. Pelvic rest. May be wheeled off unit x 1 hour daily with RN.   Preterm CXT: Stable no cxt over night, SVE on 4/11 FT/Thick  S/P magnesium x 2 and rescue dose of BMZ, 10mg  procardia q 6 hours, toco PRN contractions, if unable to stop labor plan for repeat CS.   Fetal well-being: s/p  mag for neuroprotectant x2, US showed, breech/vtx, Qshift NST reactive for gestational age. BMZ complete 3/27-28, rescue dose 4/9. EFW 2.14llbs 39% for [name] and B is 2.9 lbs 14% on 3/26. Plan for Qshift NST. Repeat growth 32 wks. [place] on 4/16 resulted twin A, AFI WNL, breech, BPP 8/8, 4.4lbs 50%. Twin B AFI WNL, transverse, BPP 8/8, 3.6 lbs, 6%, S/D elevated at 5.49, no reverse flow.  New plan recommendation for BPP with dopplers weekly, scheduled for fridays until delivery.   GDMA2: FBS this am was  66, not requiring isulin sliding scale, stable CBGs 2 hour PP and FBS to be checked has been unrearkable. Insulin sliding scale prn. Diabetic diet.   GBS:  Negative          Anemia: Hgb 9.2 on 4/12, Oral iron and MagOx  Dr Alwyn Pea to be updated.   [name], MSN, CNM 11/04/2019, 2:27 PM

## 2019-11-04 NOTE — Progress Notes (Signed)
Pt fasting cbg at 0650 was 66 without any hypoglycemic symptoms. Crackers anf juice was given. Will repeat cbg in 30 mins.

## 2019-11-04 NOTE — Progress Notes (Signed)
Rechecked cbg and it is 115.

## 2019-11-05 LAB — GLUCOSE, CAPILLARY
Glucose-Capillary: 74 mg/dL (ref 70–99)
Glucose-Capillary: 119 mg/dL — ABNORMAL HIGH (ref 70–99)
Glucose-Capillary: 83 mg/dL (ref 70–99)
Glucose-Capillary: 106 mg/dL — ABNORMAL HIGH (ref 70–99)

## 2019-11-05 NOTE — Progress Notes (Addendum)
Patient ID: Kathryn Perry, female   DOB: 03/08/1986, 34 y.o.   MRN: [id number]  Hospital day # 17  pregnancy at [redacted]w[redacted]d  -Kathryn Brownis a 34 y.o.female, G3P1011, IUP of di/di twins at58.1weeks, presenting for vaginal bleeding and preterm contractions. S/P magnesium x 2 courses and rescue dose of BMZ 4/9. Procardia 10 mg q 6 hours.   S: Pt resting in bed in NAD.  States she still has [place] discharge when using the restroom. Pt denies feeling cxt over the night. Denies any leakage of fluids. Spirits are good. +FM, no other complaints, review POC pt verbalized understanding without questions.   O:  Today's Vitals   11/05/19 0517 11/05/19 0611 11/05/19 0630 11/05/19 0752  BP:  111/68  119/69  Pulse:  90  88  Resp:  18  18  Temp:  (!) 97.5 F (36.4 C)  98.5 F (36.9 C)  TempSrc:  Oral  Oral  SpO2:  98%  98%  Weight:      Height:      PainSc: Asleep  Asleep    Body mass index is 34.39 kg/m.        NST: [name] - 130s, + accels, no decels, moderate variability               Baby B - 135s, + accels, no decels, moderate variability      Ctx occasional      Uterus gravid and non-tender      Abdomen: soft non-tender      Extremities: no significant edema and no signs of DVT           CBC Latest Ref Rng & Units 10/30/2019 10/18/2019 08/21/2019  WBC 4.0 - 10.5 K/uL 5.6 8.0 8.1  Hemoglobin 12.0 - 15.0 g/dL 9.2(L) 9.2(L) 9.4(L)  Hematocrit 36.0 - 46.0 % 29.2(L) 28.9(L) 28.1(L)  Platelets 150 - 400 K/uL 198 209 214    CBG (last 3)  Recent Labs    11/04/19 1521 11/04/19 2052 11/05/19 0741  GLUCAP 88 99 74   A: 32.1w with Hospital day # [place] a 34 y.o.female, G3P1011,  with identical DiDi twins presenting for vaginal bleeding and preterm contractions. S/P 12 hour magnesium for neuroprotection. H/O C/S.   P: Vaginal bleeding: Stable, currently still [place] spotting only when wiping, Started 10/18/2019, light first day of menses, new onset episode on 4/5 at 0400, now [place] minimal  spotting. cervical length noted to be 3.4cm on 4/1, New recommendation due to recent US per MF to delivery if any red blood noted. Pelvic rest. May be wheeled off unit x 1 hour daily with RN.   Preterm CXT: Stable no cxt over night, SVE on 4/11 FT/Thick  S/P magnesium x 2 and rescue dose of BMZ, 10mg  procardia q 6 hours, toco PRN contractions, if unable to stop labor plan for repeat CS.   Fetal well-being: s/p  mag for neuroprotectant x2, US showed, breech/vtx, Qshift NST reactive for gestational age. BMZ complete 3/27-28, rescue dose 4/9. EFW 2.14llbs 39% for [name] and B is 2.9 lbs 14% on 3/26. Plan for Qshift NST. Repeat growth 32 wks. [place] on 4/16 resulted twin A, AFI WNL, breech, BPP 8/8, 4.4lbs 50%. Twin B AFI WNL, transverse, BPP 8/8, 3.6 lbs, 6%, S/D elevated at 5.49, no reverse flow.  New plan recommendation for BPP with dopplers weekly, scheduled for fridays until delivery.   GDMA2: FBS this am was 74, not requiring isulin sliding scale, stable CBGs 2 hour  PP and FBS to be checked has been unrearkable. Insulin sliding scale prn. Diabetic diet.   GBS:  Negative          Anemia: Hgb 9.2 on 4/12, Oral iron and MagOx  Dr Alesia Richards to be updated.   Kathryn Pick, MSN, CNM 11/05/2019, 8:22 AM  MD addendum I reviewed chart and fetal heart tracings and agree with above findings, assessment and plan as outlined above by [facility].  Dr. Alesia Richards. 11/05/2019.

## 2019-11-05 NOTE — Progress Notes (Signed)
Attempted to call dietician per pt request regarding calculation of her carbohydrates for her carb mod diet. There was no answer. May need to attempt to call during the week.

## 2019-11-06 LAB — GLUCOSE, CAPILLARY
Glucose-Capillary: 73 mg/dL (ref 70–99)
Glucose-Capillary: 107 mg/dL — ABNORMAL HIGH (ref 70–99)
Glucose-Capillary: 101 mg/dL — ABNORMAL HIGH (ref 70–99)
Glucose-Capillary: 103 mg/dL — ABNORMAL HIGH (ref 70–99)

## 2019-11-06 MED ORDER — LACTATED RINGERS IV BOLUS
500.0000 mL | Freq: Once | INTRAVENOUS | Status: AC
  Administered 2019-11-06: 500 mL via INTRAVENOUS

## 2019-11-06 MED ORDER — LACTATED RINGERS IV BOLUS
500.0000 mL | Freq: Once | INTRAVENOUS | Status: AC
  Administered 2019-11-06: 10:00:00 500 mL via INTRAVENOUS

## 2019-11-06 MED ORDER — NIFEDIPINE 10 MG PO CAPS
20.0000 mg | ORAL_CAPSULE | Freq: Four times a day (QID) | ORAL | Status: DC
  Administered 2019-11-06 – 2019-11-17 (×44): 20 mg via ORAL
  Filled 2019-11-06 (×45): qty 2

## 2019-11-06 NOTE — Progress Notes (Addendum)
Patient ID: Kathryn Perry, female   DOB: 06/14/1986, 34 y.o.   MRN: [id number]  Hospital day # 18  pregnancy at [redacted]w[redacted]d  -Kathryn Perry a 34 y.o.female, G3P1011, IUP of di/di twins at32.2weeks, presenting for vaginal bleeding and preterm contractions. S/P magnesium x 2 courses and rescue dose of BMZ 4/9. Procardia 20 mg q 6 hours.   Called by RN for contractions not improved with increased Procardia dose Pt currently video chatting with guest  S: Pt states contractions are no different then what she has experienced over the past 4 days. However when they occur, the pain is 7/10. Expresses no evidence of concern.  O:  Today's Vitals   11/06/19 0615 11/06/19 0800 11/06/19 0807 11/06/19 1157  BP: 108/63  120/76 113/73  Pulse: 91  (!) 117 (!) 104  Resp: 18  18 18   Temp: 98.2 F (36.8 C)  98.1 F (36.7 C) 97.9 F (36.6 C)  TempSrc: Oral  Oral Oral  SpO2: 100%  100% 99%  Weight:      Height:      PainSc:  0-No pain     Body mass index is 34.39 kg/m.        Most recent NST reviewed: [name] - 130s, + accels, no decels, moderate variability               Baby B - 135s, + accels, no decels, moderate variability      Uterus gravid and non-tender      Abdomen: soft non-tender      Extremities: no significant edema and no signs of DVT SVE No change, FT, thick. [place] blood noted.            CBC Latest Ref Rng & Units 10/30/2019 10/18/2019 08/21/2019  WBC 4.0 - 10.5 K/uL 5.6 8.0 8.1  Hemoglobin 12.0 - 15.0 g/dL 9.2(L) 9.2(L) 9.4(L)  Hematocrit 36.0 - 46.0 % 29.2(L) 28.9(L) 28.1(L)  Platelets 150 - 400 K/uL 198 209 214    CBG (last 3)  Recent Labs    11/05/19 2104 11/06/19 0613 11/06/19 1010  GLUCAP 106* 73 107*   A: 32.2w with Hospital day # [place] a 34 y.o.female, G3P1011,  with identical DiDi twins presenting for vaginal bleeding and preterm contractions. S/P 12 hour magnesium for neuroprotection. H/O C/S.   P: No change in SVE. PT expresses no evidence of concern or  change in contraction pattern over the past 4 days. Reassurance provided to RN. Continue Procardia 20 mg q 6. PT will notify staff of any changes or concerns.     Ike Bene, MSN, CNM 11/05/2019, 8:22 AM   Will consider changing procardia to 10mg  q4 if at 20mg  the contractions continue to return upon nearing next dose.  Pt stable with just the brow discharge as she has been having.

## 2019-11-06 NOTE — Progress Notes (Signed)
Nutrition Follow-up  DOCUMENTATION CODES:   Not applicable  INTERVENTION:   Continue gestational CHO modified diet (30 gm CHO at breakfast, 45 gm at lunch and dinner). Continue double protein portions. No snacks per patient preference, husband is bringing in snacks for between meals.   NUTRITION DIAGNOSIS:   Increased nutrient needs related to (twin pregnancy) as evidenced by (30 weeks IUP).  Ongoing   GOAL:   Patient will meet greater than or equal to 90% of their needs, Weight gain  Met  MONITOR:   Labs  REASON FOR ASSESSMENT:   Gestational Diabetes    ASSESSMENT:   30 2/7 weeks IUP, adm with vaginal bleeding/ twins. Abn 3 hours GTT. No pre-pregancy weight in EPIC. 12 lbs weight gain since 1/29. Good appetite.  Patient had questions regarding meals and snacks she is receiving from the kitchen. She should receive 30 gm CHO with breakfast and 45 gm CHO with lunch and supper. She does not typically drink milk, but does like milk with her cereal. Will alter message in her diet order. Patient's husband has been bringing her preferred snacks for between meals so she does not need snacks sent from the kitchen anymore. RD to d/c snacks from kitchen. She is receiving double protein portions when requested.   Labs reviewed. CBG's: 73 fasting this morning and 107 after breakfast.  Diet Order:   Diet Order            Diet gestational carb mod Fluid consistency: Thin; Room service appropriate? Yes  Diet effective now              EDUCATION NEEDS:   Education needs have been addressed  Skin:  Skin Assessment: Reviewed RN Assessment  Height:   Ht Readings from Last 1 Encounters:  10/19/19 5' 1" (1.549 m)    Weight:   Wt Readings from Last 1 Encounters:  10/19/19 82.6 kg    Estimated Nutritional Needs:   Kcal:  2000- 2200  Protein:  90 -110 g  Fluid:  2.3 L    Kimberly Harris, RD, LDN, CNSC Please refer to Amion for contact information.                                                         

## 2019-11-06 NOTE — Progress Notes (Signed)
Patient ID: Kathryn Perry, female   DOB: October 14, 1985, 34 y.o.   MRN: [id number]  Hospital day # 18  pregnancy at [redacted]w[redacted]d  -Kathryn Brownis a 34 y.o.female, G3P1011, IUP of di/di twins at32.2weeks, presenting for vaginal bleeding and preterm contractions. S/P magnesium x 2 courses and rescue dose of BMZ 4/9. Procardia 10 mg q 6 hours.   S: Pt resting in bed in NAD.  States she still has [place] discharge when using the restroom. Pt denies feeling cxt today. Denies any leakage of fluids. Spirits are good. +FM, no other complaints, review POC pt verbalized understanding without questions. Desires to speak with diabetic dietician to discuss carb count further.  O:  Today's Vitals   11/05/19 2028 11/06/19 0615 11/06/19 0800 11/06/19 0807  BP:  108/63  120/76  Pulse:  91  (!) 117  Resp:  18  18  Temp:  98.2 F (36.8 C)  98.1 F (36.7 C)  TempSrc:  Oral  Oral  SpO2:  100%  100%  Weight:      Height:      PainSc: 0-No pain  0-No pain    Body mass index is 34.39 kg/m.        Most recent NST reviewed: [name] - 130s, + accels, no decels, moderate variability               Baby B - 135s, + accels, no decels, moderate variability      Uterus gravid and non-tender      Abdomen: soft non-tender      Extremities: no significant edema and no signs of DVT           CBC Latest Ref Rng & Units 10/30/2019 10/18/2019 08/21/2019  WBC 4.0 - 10.5 K/uL 5.6 8.0 8.1  Hemoglobin 12.0 - 15.0 g/dL 9.2(L) 9.2(L) 9.4(L)  Hematocrit 36.0 - 46.0 % 29.2(L) 28.9(L) 28.1(L)  Platelets 150 - 400 K/uL 198 209 214    CBG (last 3)  Recent Labs    11/05/19 1430 11/05/19 2104 11/06/19 0613  GLUCAP 83 106* 73   A: 32.2w with Hospital day # [place] a 34 y.o.female, G3P1011,  with identical DiDi twins presenting for vaginal bleeding and preterm contractions. S/P 12 hour magnesium for neuroprotection. H/O C/S.   P: Vaginal bleeding: Stable, currently still [place] spotting only when wiping, Started 10/18/2019, light  first day of menses, new onset episode on 4/5 at 0400, now [place] minimal spotting. cervical length noted to be 3.4cm on 4/1, New recommendation due to recent US per MF to delivery if any red blood noted. Pelvic rest. May be wheeled off unit x 1 hour daily with RN.   Preterm CXT: Stable no cxt over night, SVE on 4/11 FT/Thick  S/P magnesium x 2 and rescue dose of BMZ, 10mg  procardia q 6 hours, toco PRN contractions, if unable to stop labor plan for repeat CS.   Fetal well-being: s/p  mag for neuroprotectant x2, US showed, breech/vtx, Qshift NST reactive for gestational age. BMZ complete 3/27-28, rescue dose 4/9. EFW 2.14llbs 39% for [name] and B is 2.9 lbs 14% on 3/26. Plan for Qshift NST. Repeat growth 32 wks. [place] on 4/16 resulted twin A, AFI WNL, breech, BPP 8/8, 4.4lbs 50%. Twin B AFI WNL, transverse, BPP 8/8, 3.6 lbs, 6%, S/D elevated at 5.49, no reverse flow.  New plan recommendation for BPP with dopplers weekly, scheduled for fridays until delivery.   GDMA2: Order for diabetic dietician to meet with pt  to discus carb count further.  GBS:  Negative          Anemia: Hgb 9.2 on 4/12, Oral iron and MagOx    Ike Bene, MSN, CNM 11/05/2019, 8:22 AM

## 2019-11-06 NOTE — Progress Notes (Signed)
Inpatient Diabetes Program Recommendations  ADA Standards of Care 2019 Diabetes in Pregnancy Target Glucose Ranges:  Fasting: 60 - 90 mg/dL Preprandial: 60 - 105 mg/dL 1 hr postprandial: Less than 140mg /dL (from first bite of meal) 2 hr postprandial: Less than 120 mg/dL (from first bit of meal)   Lab Results  Component Value Date   GLUCAP 107 (H) 11/06/2019    Review of Glycemic Control  Inpatient Diabetes Program Recommendations:   Spoke with patient regarding consult. Patient wants more consistency in meals and snacks while here in the hospital. Placed a dietician consult and sent secure chat to dietician regarding patient's request.  Thank you, Nani Gasser. Terilynn Buresh, RN, MSN, CDE  Diabetes Coordinator Inpatient Glycemic Control Team Team Pager [phone] (8am-5pm) 11/06/2019 11:59 AM

## 2019-11-07 LAB — GLUCOSE, CAPILLARY
Glucose-Capillary: 91 mg/dL (ref 70–99)
Glucose-Capillary: 118 mg/dL — ABNORMAL HIGH (ref 70–99)
Glucose-Capillary: 82 mg/dL (ref 70–99)
Glucose-Capillary: 95 mg/dL (ref 70–99)

## 2019-11-07 NOTE — Progress Notes (Signed)
Remotely reviewed tracings.  Cat 1 x 2.

## 2019-11-07 NOTE — Progress Notes (Addendum)
Patient ID: Kathryn Perry, female   DOB: [birth year], 34 y.o.   MRN: [id number] Hospital day #19   -Kathryn Perry a 34 y.o.female, G3P1011, IUP of di/di twins at53w3dweeks, presenting for vaginal bleeding and preterm contractions. S/P magnesiumx 2 courses and rescue dose of BMZ 4/9. Procardia 20 mg q 6 hours.   S: Resting in bed. Reports ctx less frequent and lighter since increased Procardia yesterday, happy with effects. Having some tiredness/fatigue but no syncope. Desires to stay on current dose. VD same with small Kathryn Perry DC, + FM, no LOF.  O: BP (!) 113/53 (BP Location: Left Arm)   Pulse (!) 108   Temp 98.3 F (36.8 C) (Oral)   Resp 18   Ht 5\' 1"  (1.549 m)   Wt 82.6 kg   LMP 03/25/2019   SpO2 97%   BMI 34.39 kg/m    FHT: Twin A Baseline: 135 bpm, Variability: Good {> 6 bpm), Accelerations: Reactive and Decelerations: Absent   Twin B Baseline: 140 bpm, Variability: Good {> 6 bpm), Accelerations: Reactive and Decelerations: Absent Toco: irregular, unchanged intensity SVE: deferred  CBG (last 3)  Recent Labs    11/06/19 1540 11/06/19 2128 11/07/19 0555  GLUCAP 101* 103* 91     A: 34 y.o. at [redacted]w[redacted]d G3P1011, with identical DiDi twins presenting for vaginal bleeding and preterm contractions.  H/O C/S.  P: Vaginal bleeding: Stable, currently still Tally spotting only when wiping, Started 10/18/2019, light first day of menses, new onset episode on 4/5 at 0400, now Kathryn Perry minimal spotting. cervical length noted to be 3.4cm on 4/1, New recommendation due to recent US per MF to delivery if any red blood noted. Pelvic rest. May be wheeled off unit x 1 hour daily with RN.   Preterm [id number] no cxt over night, SVE unchanged rpt exam 4/19 S/P magnesium x 2 and rescue dose of BMZ,  20 mg procardiaq 6 hours increased 4/19, tocoPRN contractions,  if unable to stop laborplan for repeat CS.   Fetal well-being:s/p mag for neuroprotectantx2, US showed, breech/vtx, Q shift NST  reactive for gestational age.  BMZ complete 3/27-28, rescue dose4/9.  3/26 - EFW 2.14llbs 39% for [name] and B is 2.9 lbs 14%. Repeat growth 32 wks.  4/16 EFW twin A, AFI WNL, breech, BPP 8/8, 4.4lbs 50%. Twin B AFI WNL, transverse, BPP 8/8, 3.6 lbs, 6%, S/D elevated at 5.49, no reverse flow.  New plan recommendation for BPP with dopplers weekly, scheduled for Fridays until delivery.   GDMA2: Stable and compliant, no SSI needed to date GBS: Negative          Anemia: Hgb 9.2 on 4/12, Oral iron and MagOx  Kathryn Perry, CNM, MSN 11/07/2019, 8:51 AM   Stable and reports just some Kathryn Perry discharge with wiping.  Pt says the 20mg  of procardia q6hrs has helped a lot.

## 2019-11-08 LAB — GLUCOSE, CAPILLARY
Glucose-Capillary: 82 mg/dL (ref 70–99)
Glucose-Capillary: 113 mg/dL — ABNORMAL HIGH (ref 70–99)
Glucose-Capillary: 95 mg/dL (ref 70–99)
Glucose-Capillary: 85 mg/dL (ref 70–99)

## 2019-11-08 LAB — CBC
HCT: 27.7 % — ABNORMAL LOW (ref 36.0–46.0)
Hemoglobin: 8.7 g/dL — ABNORMAL LOW (ref 12.0–15.0)
MCH: 28.2 pg (ref 26.0–34.0)
MCHC: 31.4 g/dL (ref 30.0–36.0)
MCV: 89.6 fL (ref 80.0–100.0)
Platelets: 190 10*3/uL (ref 150–400)
RBC: 3.09 MIL/uL — ABNORMAL LOW (ref 3.87–5.11)
RDW: 15.1 % (ref 11.5–15.5)
WBC: 5.4 10*3/uL (ref 4.0–10.5)
nRBC: 0 % (ref 0.0–0.2)

## 2019-11-08 LAB — TYPE AND SCREEN
ABO/RH(D): O POS
Antibody Screen: NEGATIVE

## 2019-11-08 MED ORDER — SODIUM CHLORIDE 0.9 % IV SOLN
510.0000 mg | Freq: Once | INTRAVENOUS | Status: AC
  Administered 2019-11-08: 510 mg via INTRAVENOUS
  Filled 2019-11-08: qty 17

## 2019-11-08 MED ORDER — DIPHENHYDRAMINE HCL 25 MG PO CAPS
25.0000 mg | ORAL_CAPSULE | Freq: Four times a day (QID) | ORAL | Status: DC | PRN
  Administered 2019-11-08 – 2019-11-10 (×2): 25 mg via ORAL
  Filled 2019-11-08 (×2): qty 1

## 2019-11-08 NOTE — Progress Notes (Signed)
Patient ID: Kathryn Perry, female   DOB: 02/10/1986, 34 y.o.   MRN: [id number] Hospital day #20   -Kathryn Perry a 34 y.o.female, G3P1011, IUP of di/di twins at28w4dweeks, presenting for vaginal bleeding and preterm contractions. S/P magnesiumx 2 courses and rescue dose of BMZ 4/9. Procardia 20 mg q 6 hours.   S: Resting in bed. States she still has occasional painful contractions but they are not regular. Reports that her vaginal discharge last night was dark red with [place] and called RN to room to look. States her discharge now is back to Joslin. Endorses + fetal movement x 2. Denies leaking of fluid.   O: BP 116/72 (BP Location: Left Arm)   Pulse (!) 107   Temp 98.1 F (36.7 C) (Oral)   Resp 18   Ht 5\' 1"  (1.549 m)   Wt 82.6 kg   LMP 03/25/2019   SpO2 99%   BMI 34.39 kg/m    FHT: Twin A Baseline: 135 bpm, Variability: Good {> 6 bpm), Accelerations: Reactive and Decelerations: Absent   Twin B Baseline: 140 bpm, Variability: Good {> 6 bpm), Accelerations: Reactive and Decelerations: Absent Toco: irregular SVE: deferred  CBG (last 3)  Recent Labs    11/07/19 2151 11/08/19 0614 11/08/19 1047  GLUCAP 95 82 113*     A: 34 y.o. at [redacted]w[redacted]d G3P1011, with identical DiDi twins presenting for vaginal bleeding and preterm contractions.  H/O C/S.  P: Vaginal bleeding: Stable, currently still [place] spotting only when wiping, Started 10/18/2019, light first day of menses, new onset episode on 4/5 at 0400, now Maeda minimal spotting. cervical length noted to be 3.4cm on 4/1, New recommendation due to recent US per MF to delivery if any red blood noted. Pelvic rest. May be wheeled off unit x 1 hour daily with RN.   Preterm [id number] contractions, SVE unchanged per exam on 4/19, closed/thick S/P magnesium x 2 and rescue dose of BMZ,  20 mg procardiaq 6 hours increased 4/19, tocoPRN contractions, if unable to stop laborplan for repeat CS.   Fetal well-being:s/p mag for  neuroprotectantx2, US showed, breech/vtx, Q shift NST reactive for gestational age.  BMZ complete 3/27-28, rescue dose4/9.  3/26 - EFW 2.14llbs 39% for [name] and B is 2.9 lbs 14%. Repeat growth 32 wks.  4/16 EFW twin A, AFI WNL, breech, BPP 8/8, 4.4lbs 50%. Twin B AFI WNL, transverse, BPP 8/8, 3.6 lbs, 6%, S/D elevated at 5.49, no reverse flow.  New plan recommendation for BPP with dopplers weekly, scheduled for Fridays until delivery.   GDMA2: Stable and compliant, no SSI needed to date  GBS: Negative          Anemia: Hgb 9.2 on 4/12, was given oral iron and MagOx, repeat CBC today with Hgb 8.7. Will order IV Feraheme.   [name], CNM, MSN 11/08/2019, 1:38 PM

## 2019-11-08 NOTE — Progress Notes (Signed)
Discussed afternoon monitoring with Gavin Potters, CNM.  She reviewed strip and felt the strip was reactive and could remove pt. From monitor.

## 2019-11-08 NOTE — Progress Notes (Signed)
Pt  Called pt around 0135 and wanted to show RN her sanitary pad it had a scant amount of dark Salemi discharge. She said she felt a drop of the discharge fall in the toilet that looked a bit of brownish red. Rn and Rn Child psychotherapist observed in the toilet it looked like a tint of brownish discharge. Nothing to worry about , kept the pad and will continue to monitor pt closely.

## 2019-11-09 LAB — GLUCOSE, CAPILLARY
Glucose-Capillary: 104 mg/dL — ABNORMAL HIGH (ref 70–99)
Glucose-Capillary: 139 mg/dL — ABNORMAL HIGH (ref 70–99)
Glucose-Capillary: 105 mg/dL — ABNORMAL HIGH (ref 70–99)
Glucose-Capillary: 105 mg/dL — ABNORMAL HIGH (ref 70–99)

## 2019-11-09 MED ORDER — ACETAMINOPHEN 500 MG PO TABS
1000.0000 mg | ORAL_TABLET | Freq: Four times a day (QID) | ORAL | Status: DC | PRN
  Administered 2019-11-10 – 2019-11-11 (×2): 1000 mg via ORAL
  Filled 2019-11-09 (×2): qty 2

## 2019-11-09 NOTE — Progress Notes (Addendum)
Patient ID: Kathryn Perry, female   DOB: 19-Oct-1985, 34 y.o.   MRN: [id number] Hospital day #21   -Kathryn Perry a 34 y.o.female, G3P1011, IUP of di/di twins at69w5dweeks, presenting for vaginal bleeding and preterm contractions. S/P magnesiumx 2 courses and rescue dose of BMZ 4/9. Procardia 20 mg q 6 hours.   S: Resting in bed. States she still has occasional painful contractions but they are not regular. Reports continued Waltz discharge. Endorses + fetal movement x 2. Denies leaking of fluid. Requests to have NICU come to talk to her about what to expect.   O: BP 105/62 (BP Location: Left Arm)   Pulse 98   Temp 98.1 F (36.7 C) (Oral)   Resp 18   Ht 5\' 1"  (1.549 m)   Wt 82.6 kg   LMP 03/25/2019   SpO2 97%   BMI 34.39 kg/m    FHT: Twin A   Baseline: 125 bpm, Variability: Good {> 6 bpm), Accelerations: Reactive and Decelerations: Absent  Twin B Baseline: 130 bpm, Variability: Good {> 6 bpm), Accelerations: Reactive and Decelerations: Absent Toco: irregular SVE: deferred  CBG (last 3)  Recent Labs    11/08/19 1511 11/08/19 2035 11/09/19 0540  GLUCAP 95 85 104*     A: 34 y.o. at [redacted]w[redacted]d G3P1011, with identical DiDi twins presenting for vaginal bleeding and preterm contractions.  H/O C/S.  P: Vaginal bleeding: Stable, currently still Parlin spotting only when wiping, Started 10/18/2019, light first day of menses, new onset episode on 4/5 at 0400, now Deridder minimal spotting. cervical length noted to be 3.4cm on 4/1, New recommendation due to recent US per MFM is to deliver if any red blood noted after 34 weeks. Pelvic rest. May be wheeled off unit x 1 hour daily with RN.   Preterm [id number] contractions, SVE unchanged per exam on 4/19, closed/thick S/P magnesium x 2 and rescue dose of BMZ,  20 mg procardiaq 6 hours increased 4/19, tocoPRN contractions, if unable to stop laborplan for repeat CS.   Fetal well-being:s/p mag for neuroprotectantx2, US showed,  breech/vtx, Q shift NST reactive for gestational age.  BMZ complete 3/27-28, rescue dose4/9.  3/26 - EFW 2.14llbs 39% for [name] and B is 2.9 lbs 14%. Repeat growth 32 wks.  4/16 EFW twin A, AFI WNL, breech, BPP 8/8, 4.4lbs 50%. Twin B AFI WNL, transverse, BPP 8/8, 3.6 lbs, 6%, S/D elevated at 5.49, no reverse flow.  New plan recommendation for BPP with dopplers weekly, scheduled for Fridays until delivery.   GDMA2: Stable and compliant, no SSI needed to date  GBS: Negative          Anemia: Hgb 9.2 on 4/12, was given oral iron and MagOx, repeat CBC 4/21 with Hgb 8.7. IV Feraheme given.  Kathryn Perry, CNM, MSN 11/09/2019, 8:29 AM  Attestation of Attending Supervision of Advanced Practitioner (CNM/NP): Evaluation and management procedures were performed by the Advanced Practitioner under my supervision and collaboration.  I have reviewed the Advanced Practitioner's note and chart, and I agree with the management and plan. I saw and examined patient at bedside and agree with above findings, assessment and plan as outlined above by CNM Gavin Potters.   11/09/2019. 1757.

## 2019-11-09 NOTE — Progress Notes (Addendum)
Kathryn Perry DOB 1986/04/05 MRN: [id number]  S:  Called by RN for patient complaining of chest discomfort.  Patient reports she started having symptoms this morning.  Feels mid chest discomfort that radiates to opposite side on her upper back which then feels like back pain of 4/10 intensity.  These symptoms usually last for a few minutes then disappear by themselves.  She also gets shortness of breath at that time and feels her heart racing, almost like a panic attack sensation.  She denies other symptoms such as nausea, vomiting or abdominal pain.  She denies feeling anxious or with panic attacks.  She takes prilosec for acid reflux symptoms with good control of acid symptoms.  She wonders if her chest discomfort and back pain symptoms could be a side effect of the recent IV feraheme that she received last night.  She reports intermittent contractions, not present now.  She has continued with vaginal bleeding but today seems with more clots.  She reports normal gross fetal movement for the twins.   O: Today's Vitals   11/09/19 0855 11/09/19 1100 11/09/19 1115 11/09/19 1617  BP:    (!) 121/58  Pulse:    100  Resp:    18  Temp:    98 F (36.7 C)  TempSrc:    Oral  SpO2:  97% 98% 98%  Weight:      Height:      PainSc: 0-No pain         Vitals with BMI 11/09/2019 11/09/2019 11/09/2019  Height - - -  Weight - - -  BMI - - -  Systolic [id number] 0000000  Diastolic 58 62 61  Pulse 123XX123 98 94  Exam:  General: Appears well, in no acute distress. Cardiovascular: s1, s2, Regular rate and rhythm Pulmonary: Clear to auscultation bilaterally Abdomen: Soft, gravid.  Extremities: Warm and well perfused, no edema, no calf tenderness bilaterally. Peripad: Pad used for past four hours with small Hanel blood streaks.  EFM at 1725:  Baseline 140, moderate variability, reactive for both twins.  TOCO: Irregular contractions Q 5 to 7 minutes.      Current Facility-Administered Medications:  .  acetaminophen  (TYLENOL) tablet 1,000 mg, 1,000 mg, Oral, Q6H PRN, Alesia Richards, Bellamarie Pflug, MD .  calcium carbonate (TUMS - dosed in mg elemental calcium) chewable tablet 400 mg of elemental calcium, 2 tablet, Oral, Q4H PRN, Thurnell Lose, MD, 400 mg of elemental calcium at 10/20/19 2223 .  cetirizine (ZYRTEC) tablet 10 mg, 10 mg, Oral, Daily, Crumpler, Jennifer B, CNM, 10 mg at 11/09/19 0856 .  diphenhydrAMINE (BENADRYL) capsule 25 mg, 25 mg, Oral, Q6H PRN, [name], CNM, 25 mg at 11/08/19 2331 .  docusate sodium (COLACE) capsule 100 mg, 100 mg, Oral, Daily, Thurnell Lose, MD, 100 mg at 11/09/19 [id number] .  fluticasone (FLONASE) 50 MCG/ACT nasal spray 1 spray, 1 spray, Each Nare, Daily, Crumpler, Jennifer B, CNM, 1 spray at 11/09/19 [phone] .  CBG monitoring, , , 4x Daily **AND** insulin aspart (novoLOG) injection 0-24 Units, 0-24 Units, Subcutaneous, QID PRN, Everett Graff, MD, 2 Units at 11/09/19 1117 .  iron polysaccharides (NIFEREX) capsule 150 mg, 150 mg, Oral, BID, [name], CNM, 150 mg at 11/09/19 [id number] .  magnesium oxide (MAG-OX) tablet 400 mg, 400 mg, Oral, Daily, [name], CNM, 400 mg at 11/09/19 [id number] .  NIFEdipine (PROCARDIA) capsule 20 mg, 20 mg, Oral, Q6H, Crumpler, Jennifer B, CNM, 20 mg at 11/09/19 1722 .  omeprazole (PRILOSEC)  capsule 20 mg, 20 mg, Oral, Daily, Sanjuana Kava, MD, 20 mg at 11/09/19 0857 .  prenatal multivitamin tablet 1 tablet, 1 tablet, Oral, Q1200, Thurnell Lose, MD, 1 tablet at 11/09/19 1203 .  simethicone (MYLICON) chewable tablet 80 mg, 80 mg, Oral, TID, [place], [place], FNP, 80 mg at 11/09/19 1722 .  sodium chloride (OCEAN) 0.65 % nasal spray 1 spray, 1 spray, Each Nare, PRN, Crumpler, Jennifer B, CNM, 1 spray at 10/25/19 1002 .  sodium chloride flush (NS) 0.9 % injection 3 mL, 3 mL, Intravenous, Q12H, Jones, Amanda K, CNM, 3 mL at 11/09/19 [id number] .  zolpidem (AMBIEN) tablet 5 mg, 5 mg, Oral, QHS PRN, Thurnell Lose, MD  EKG 11/09/2019: Preliminary report: normal EKG,  normal sinus rhythm.  Assessment and plan: 34 y/o P1 at 32 weeks 5 days EGA with di tiup, admitted for third trimester vaginal bleeding and contractions,  S/p IV feraheme yesterday, with chest discomfort,  - Unlikely cardiac origin given findings, may be musculoskeletal in nature given prolonged modified bed rest positioning but also could be third trimester physiologic changes in pregnancy.  I advised tylenol usage for pain as needed.  If symptoms continue despite tylenol use then will do further work up.  - discussed her chest and back symptoms are listed as potential adverse reactions of IV feraheme though their risks are low. -pain precautions reviewed.  - c/w current care.   Dr. Alesia Richards. 11/09/2019.  1735.

## 2019-11-10 ENCOUNTER — Ambulatory Visit (HOSPITAL_COMMUNITY): Payer: BC Managed Care – PPO

## 2019-11-10 ENCOUNTER — Inpatient Hospital Stay (HOSPITAL_BASED_OUTPATIENT_CLINIC_OR_DEPARTMENT_OTHER): Payer: BC Managed Care – PPO

## 2019-11-10 ENCOUNTER — Inpatient Hospital Stay (HOSPITAL_COMMUNITY): Payer: BC Managed Care – PPO

## 2019-11-10 ENCOUNTER — Encounter (HOSPITAL_COMMUNITY): Payer: Self-pay

## 2019-11-10 DIAGNOSIS — O365931 Maternal care for other known or suspected poor fetal growth, third trimester, fetus 1: Secondary | ICD-10-CM

## 2019-11-10 DIAGNOSIS — Z3A32 32 weeks gestation of pregnancy: Secondary | ICD-10-CM

## 2019-11-10 DIAGNOSIS — R0789 Other chest pain: Secondary | ICD-10-CM

## 2019-11-10 DIAGNOSIS — O30043 Twin pregnancy, dichorionic/diamniotic, third trimester: Secondary | ICD-10-CM

## 2019-11-10 LAB — CBC WITH DIFFERENTIAL/PLATELET
Abs Immature Granulocytes: 0.13 10*3/uL — ABNORMAL HIGH (ref 0.00–0.07)
Basophils Absolute: 0 10*3/uL (ref 0.0–0.1)
Basophils Relative: 0 %
Eosinophils Absolute: 0 10*3/uL (ref 0.0–0.5)
Eosinophils Relative: 1 %
HCT: 30.5 % — ABNORMAL LOW (ref 36.0–46.0)
Hemoglobin: 9.6 g/dL — ABNORMAL LOW (ref 12.0–15.0)
Immature Granulocytes: 2 %
Lymphocytes Relative: 38 %
Lymphs Abs: 2.6 10*3/uL (ref 0.7–4.0)
MCH: 28.2 pg (ref 26.0–34.0)
MCHC: 31.5 g/dL (ref 30.0–36.0)
MCV: 89.7 fL (ref 80.0–100.0)
Monocytes Absolute: 0.8 10*3/uL (ref 0.1–1.0)
Monocytes Relative: 12 %
Neutro Abs: 3.3 10*3/uL (ref 1.7–7.7)
Neutrophils Relative %: 47 %
Platelets: 216 10*3/uL (ref 150–400)
RBC: 3.4 MIL/uL — ABNORMAL LOW (ref 3.87–5.11)
RDW: 15.4 % (ref 11.5–15.5)
WBC: 6.9 10*3/uL (ref 4.0–10.5)
nRBC: 0.4 % — ABNORMAL HIGH (ref 0.0–0.2)

## 2019-11-10 LAB — COMPREHENSIVE METABOLIC PANEL
ALT: 16 U/L (ref 0–44)
AST: 21 U/L (ref 15–41)
Albumin: 2.6 g/dL — ABNORMAL LOW (ref 3.5–5.0)
Alkaline Phosphatase: 121 U/L (ref 38–126)
Anion gap: 10 (ref 5–15)
BUN: 9 mg/dL (ref 6–20)
CO2: 19 mmol/L — ABNORMAL LOW (ref 22–32)
Calcium: 8.6 mg/dL — ABNORMAL LOW (ref 8.9–10.3)
Chloride: 108 mmol/L (ref 98–111)
Creatinine, Ser: 0.61 mg/dL (ref 0.44–1.00)
GFR calc Af Amer: >60 mL/min (ref >60)
GFR calc non Af Amer: >60 mL/min (ref >60)
Glucose, Bld: 65 mg/dL — ABNORMAL LOW (ref 70–99)
Potassium: 3.6 mmol/L (ref 3.5–5.1)
Sodium: 137 mmol/L (ref 135–145)
Total Bilirubin: 0.4 mg/dL (ref 0.3–1.2)
Total Protein: 5.9 g/dL — ABNORMAL LOW (ref 6.5–8.1)

## 2019-11-10 LAB — TROPONIN I (HIGH SENSITIVITY)
Troponin I (High Sensitivity): 5 ng/L (ref <18)
Troponin I (High Sensitivity): 5 ng/L (ref <18)
Troponin I (High Sensitivity): 3 ng/L (ref <18)

## 2019-11-10 LAB — GLUCOSE, CAPILLARY
Glucose-Capillary: 81 mg/dL (ref 70–99)
Glucose-Capillary: 78 mg/dL (ref 70–99)
Glucose-Capillary: 105 mg/dL — ABNORMAL HIGH (ref 70–99)
Glucose-Capillary: 102 mg/dL — ABNORMAL HIGH (ref 70–99)

## 2019-11-10 MED ORDER — IOHEXOL 350 MG/ML SOLN
60.0000 mL | Freq: Once | INTRAVENOUS | Status: AC | PRN
  Administered 2019-11-10: 60 mL via INTRAVENOUS

## 2019-11-10 NOTE — Progress Notes (Addendum)
Called to room and patient c/o constant pressure in her chest that started suddenly, rates at 8/10. VSS. EFM applied and assessing. Ohlman CNM notified of patient status and will discuss with attending and call RN back with orders.

## 2019-11-10 NOTE — Consult Note (Signed)
[facility] Medical Consultation  Kathryn Perry [id number] DOB: 1985/11/30 DOA: 10/18/2019 PCP: Maryland Pink, MD   Requesting physician: Dr. Alesia Richards Date of consultation: 11/10/2019 Reason for consultation: Chest pain  Impression/Recommendations Active Problems:   Vaginal bleeding, DiDi twins   Twin pregnancy in third trimester / DiDi    1. Chest pain Reproducible, no significant risk factor for coronary artery disease (FH, mother SVT, father HTN) TIMI=0 Will follow up with the CTA result EKG and trop re-assuring, order Echo to rule out focal wall motion abnormalities as well as pericardium. Clinically there is no s/s of CHF.  2. Acute anemia 2nd to vaginal bleeding She reported less bleeding today, H/H remains stable  3. Pre-term contraction As per primary team.   I will followup again tomorrow. Please contact me if I can be of assistance in the meanwhile. Thank you for this consultation.  Chief Complaint: Chest pain  HPI: 34 y.o. female with no significant PMH was admitted for OB service since 04/01 repeated vaginal bleeding and preterm contractions. She is G3P1011, IUP at 32 weeks as of now with di/di twins. She has been on po Iron pills and daily H/H has been stable, and she also takes Nifedipine for contractions. Yesterday afternoon, while was lying in bed, she started to feel chest pain, pressure like 710 retro-sternal, with feeling of SOB, no feeling of nausea or palpitations. No worsening or relieving factors and subsided in 15-20 min. She had another episode today afternoon, about the same time, subsided in about 10-15 min with SOB and palpitations. EKG, troponin were sent. OB attending ordered CTA to rule out PE.   Review of Systems:  All systems review negative except those mentioned in HPI  Past Medical History:  Diagnosis Date  . Anxiety   . Endometriosis   . Missed ab   . Vaginal Pap smear, abnormal   . Wears glasses    Past Surgical History:   Procedure Laterality Date  . CESAREAN SECTION N/A 10/07/2017   Procedure: CESAREAN SECTION;  Surgeon: Sanjuana Kava, MD;  Location: [place];  Service: Obstetrics;  Laterality: N/A;  . CHROMOPERTUBATION N/A 06/26/2015   Procedure: CHROMOPERTUBATION;  Surgeon: Eldred Manges, MD;  Location: [facility];  Service: Gynecology;  Laterality: N/A;  . DILATION AND CURETTAGE OF UTERUS    . DILATION AND EVACUATION N/A 03/27/2016   Procedure: DILATATION AND EVACUATION;  Surgeon: Eldred Manges, MD;  Location: [place];  Service: Gynecology;  Laterality: N/A;  . LAPAROSCOPIC OVARIAN CYSTECTOMY N/A 06/26/2015   Procedure: LAPAROSCOPIC OVARIAN CYSTECTOMY;  Surgeon: Eldred Manges, MD;  Location: [facility];  Service: Gynecology;  Laterality: N/A;  . LAPAROSCOPY N/A 06/26/2015   Procedure: LAPAROSCOPY OPERATIVE with excision of fEndometriosis and ablation of endometriosis;  Surgeon: Eldred Manges, MD;  Location: [facility];  Service: Gynecology;  Laterality: N/A;  . LEEP  2015  . WISDOM TOOTH EXTRACTION     Social History:  reports that she has never smoked. She has never used smokeless tobacco. She reports previous alcohol use. She reports that she does not use drugs.  Allergies  Allergen Reactions  . Nsaids Hives    Aleve (naproxen), Ibuprofen, ASA  . Percocet [Oxycodone-Acetaminophen] Hives    Pt reports being able to take Tylenol #3 without adverse effects  . Asa [Aspirin] Hives   Family History  Problem Relation Age of Onset  . Colon cancer Paternal Grandfather     Prior to Admission medications   Medication Sig  Start Date End Date Taking? Authorizing Provider  acetaminophen (TYLENOL) 500 MG tablet Take 500 mg by mouth every 6 (six) hours as needed for mild pain or headache.    Yes [provider]  cetirizine (ZYRTEC) 10 MG tablet Take 10 mg by mouth daily.   Yes [provider]  ferrous sulfate 325 (65 FE) MG tablet Take 1 tablet (325 mg total) by  mouth daily. 08/22/19 08/21/20 Yes Nugent, Gerrie Nordmann, NP  NIFEdipine (PROCARDIA) 10 MG capsule Take 1 capsule (10 mg total) by mouth every 4 (four) hours as needed. 10/15/19  Yes Starr Lake, CNM  omeprazole (PRILOSEC) 20 MG capsule Take 20 mg by mouth daily.   Yes [provider]  Prenatal Vit-Fe Fumarate-FA (PRENATAL MULTIVITAMIN) TABS tablet Take 1 tablet by mouth daily at 12 noon.   Yes [provider]  sulfacetamide (BLEPH-10) 10 % ophthalmic solution Place 1 drop into the left eye every 3 (three) hours. 03/18/19   [provider]   Physical Exam: Blood pressure 120/65, pulse (!) 107, temperature 97.8 F (36.6 C), temperature source Oral, resp. rate (!) 28, height 5\' 1"  (1.549 m), weight 82.6 kg, last menstrual period 03/25/2019, SpO2 96 %, unknown if currently breastfeeding. Vitals:   11/10/19 1305 11/10/19 1306  BP:  120/65  Pulse:  (!) 107  Resp:    Temp:    SpO2: 96%      General:  Nervous, not in acute distress  Eyes: PERRL  ENT: No rash  Neck: Supple, no JVD  Cardiovascular: Tachy, no murmurs  Respiratory: Clear breathing  Abdomen: Pregnant  Skin: No rash, no swelling  Musculoskeletal: No tender  Psychiatric: Calm  Neurologic: No focal deficit  Labs on Admission:  Basic Metabolic Panel: Recent Labs  Lab 11/10/19 1321  NA 137  K 3.6  CL 108  CO2 19*  GLUCOSE 65*  BUN 9  CREATININE 0.61  CALCIUM 8.6*   Liver Function Tests: Recent Labs  Lab 11/10/19 1321  AST 21  ALT 16  ALKPHOS 121  BILITOT 0.4  PROT 5.9*  ALBUMIN 2.6*   No results for input(s): LIPASE, AMYLASE in the last 168 hours. No results for input(s): AMMONIA in the last 168 hours. CBC: Recent Labs  Lab 11/08/19 0536 11/10/19 1321  WBC 5.4 6.9  NEUTROABS  --  3.3  HGB 8.7* 9.6*  HCT 27.7* 30.5*  MCV 89.6 89.7  PLT 190 216   Cardiac Enzymes: No results for input(s): CKTOTAL, CKMB, CKMBINDEX, TROPONINI in the last 168  hours. BNP: Invalid input(s): POCBNP CBG: Recent Labs  Lab 11/09/19 1056 11/09/19 1515 11/09/19 2030 11/10/19 0625 11/10/19 1032  GLUCAP 139* 105* 105* 81 78    Radiological Exams on Admission: DG Chest Port 1 View  Result Date: 11/10/2019 CLINICAL DATA:  Acute chest pain and pressure.  Short of breath. EXAM: PORTABLE CHEST 1 VIEW COMPARISON:  None. FINDINGS: Cardiac silhouette is normal in size. No mediastinal or hilar masses. No evidence of adenopathy. Clear lungs.  No pleural effusion or pneumothorax. Skeletal structures are grossly intact. IMPRESSION: No active disease. Electronically Signed   By: Lajean Manes M.D.   On: 11/10/2019 13:41    EKG: Independently reviewed. Sinus tachy, no acute ST-T changes  Time spent: [facility] Pager [phone]   11/10/2019, 3:06 PM

## 2019-11-10 NOTE — Progress Notes (Signed)
EKG at bedside. Toya Smothers, RN

## 2019-11-10 NOTE — Progress Notes (Signed)
Pt returned to her room from CT scan. Patient stable. Appears without difficulty. Will continue to monitor. Toya Smothers, RN

## 2019-11-10 NOTE — Progress Notes (Signed)
Patient in ultrasound. Toya Smothers, RN

## 2019-11-10 NOTE — Progress Notes (Signed)
MD NOTE  Name: Kathryn Perry Medical Record Number:  [id number] Date of Birth: 05-23-1986 Date of Service: 11/10/2019  34 y.o. G3P1011 [redacted]w[redacted]d HD#22 with Di/Di Twins admitted for vaginal bleeding.   Patient with acute chest pain and chest pressure.  She reports that it just started and she had acute  "feels like an elephant on the chest pressure" non radiating.  She feels short of breath, "but feels good oxygenation".  No obstetric complaints.    The patient's past medical history and prenatal records were reviewed.  Additional issues addressed and updated today: Patient Active Problem List   Diagnosis Date Noted  . Twin pregnancy in third trimester / DiDi 10/28/2019  . Vaginal bleeding, DiDi twins 10/18/2019  . Status post cesarean section 10/07/2017  . Right ovarian cyst 06/26/2015  . Endometriosis determined by laparoscopy 06/26/2015    Physical Examination:   Vitals:   11/10/19 1035 11/10/19 1227  BP: 118/73 (!) 111/52  Pulse: 91 (!) 135  Resp: 18 (!) 28  Temp: 97.8 F (36.6 C)   SpO2: 99%    General appearance - alert, ill appearing, in distress Lungs decreased BS lower bases bilaterally Cor tachycardic Abd  Soft, gravid, nontender Ex negative Holman's  FHTs  A/B 150s, moderate variability accels no decels Toco irritabilty  Cervix: not evaluated  Results for orders placed or performed during the hospital encounter of 10/18/19 (from the past 24 hour(s))  Glucose, capillary     Status: Abnormal   Collection Time: 11/09/19  3:15 PM  Result Value Ref Range   Glucose-Capillary 105 (H) 70 - 99 mg/dL  Glucose, capillary     Status: Abnormal   Collection Time: 11/09/19  8:30 PM  Result Value Ref Range   Glucose-Capillary 105 (H) 70 - 99 mg/dL  Glucose, capillary     Status: None   Collection Time: 11/10/19  6:25 AM  Result Value Ref Range   Glucose-Capillary 81 70 - 99 mg/dL  Glucose, capillary     Status: None   Collection Time: 11/10/19 10:32 AM  Result Value Ref  Range   Glucose-Capillary 78 70 - 99 mg/dL    Assessment: HD#22  [redacted]w[redacted]d Di/Di twins with new onset chest pain.  Plan: 1. Stat cbc, cmp,  troponins, chest x ray, EKG 2. Spiral CT to r/o PE 3. Consult to internal medicine 4. Continuous monitoring of fetus  [employer]

## 2019-11-10 NOTE — Progress Notes (Signed)
Dr. Alwyn Pea and Gavin Potters are currently at the bedside for assessment of chest pain. Multiple orders received. Pt stable at th is moment. Toya Smothers, RN

## 2019-11-10 NOTE — Progress Notes (Addendum)
Chest CT Angiogram ordered. Hospitalist consulted, he says he will be here soon, after patient gets her chest CT.  Dr. Alesia Richards.  1335.

## 2019-11-10 NOTE — Progress Notes (Signed)
Portable chest x-ray taken. .Kathryn Perry L Tiandre Teall

## 2019-11-10 NOTE — Progress Notes (Addendum)
Patient ID: Kathryn Perry, female   DOB: 07/04/1986, 34 y.o.   MRN: [id number] Hospital day #22   -Jeantte Wibbenmeyer a 34 y.o.female, G3P1011, IUP of di/di twins at16w5dweeks, presenting for vaginal bleeding and preterm contractions. S/P magnesiumx 2 courses and rescue dose of BMZ 4/9. Procardia 20 mg q 6 hours.   S: During AM rounds, pt was resting comfortably in bed. States she has some mild pain in her chest that radiates to her back. States her contractions are the same. Still having Schicker discharge when she wipes. Denies leaking of fluid. Endorses + fetal movement x 2.  O: BP 120/65   Pulse (!) 107   Temp 97.8 F (36.6 C) (Oral)   Resp (!) 28   Ht 5\' 1"  (1.549 m)   Wt 82.6 kg   LMP 03/25/2019   SpO2 96%   BMI 34.39 kg/m    FHT: Twin A   Baseline: 125 bpm, Variability: Good {> 6 bpm), Accelerations: Reactive and Decelerations: Absent  Twin B Baseline: 130 bpm, Variability: Good {> 6 bpm), Accelerations: Reactive and Decelerations: Absent Toco: irregular SVE: deferred  CBG (last 3)  Recent Labs    11/09/19 2030 11/10/19 0625 11/10/19 1032  GLUCAP 105* 81 78     A: 34 y.o. at [redacted]w[redacted]d G3P1011, with identical DiDi twins presenting for vaginal bleeding and preterm contractions.  H/O C/S.  P: Vaginal bleeding: Stable, currently still [place] spotting only when wiping, Started 10/18/2019, light first day of menses, new onset episode on 4/5 at 0400, now [place] minimal spotting. cervical length noted to be 3.4cm on 4/1, New recommendation due to recent US per MFM is to deliver if any red blood noted after 34 weeks. Pelvic rest. May be wheeled off unit x 1 hour daily with RN.   Preterm [id number] contractions, SVE unchanged per exam on 4/19, closed/thick S/P magnesium x 2 and rescue dose of BMZ, 20 mg Procardiaq 6 hours increased 4/19, tocoPRN contractions, if unable to stop laborplan for repeat CS.   Fetal well-being:s/p Mag sulfate for neuroprotectantx2, US showed,  breech/vtx, Q shift NST reactive for gestational age. BMZ complete 3/27-28, rescue dose4/9.  3/26 - EFW 2.14llbs 39% for [name] and B is 2.9 lbs 14%. Repeat growth 32 wks.  4/16 EFW twin A, AFI WNL, breech, BPP 8/8, 4.4lbs 50%. Twin B AFI WNL, transverse, BPP 8/8, 3.6 lbs, 6%, S/D elevated at 5.49, no reverse flow.  New plan recommendation for BPP with dopplers weekly, scheduled for Fridays until delivery.   GDMA2: Stable and compliant, no SSI needed to date  GBS: Negative          Anemia: Hgb 9.2 on 4/12, was given oral iron and MagOx, repeat CBC 4/21 with Hgb 8.7. IV Feraheme given.  [name], CNM, MSN 11/10/2019, 3:39 PM

## 2019-11-11 ENCOUNTER — Inpatient Hospital Stay (HOSPITAL_COMMUNITY): Payer: BC Managed Care – PPO

## 2019-11-11 DIAGNOSIS — N939 Abnormal uterine and vaginal bleeding, unspecified: Secondary | ICD-10-CM

## 2019-11-11 DIAGNOSIS — I361 Nonrheumatic tricuspid (valve) insufficiency: Secondary | ICD-10-CM

## 2019-11-11 LAB — GLUCOSE, CAPILLARY
Glucose-Capillary: 86 mg/dL (ref 70–99)
Glucose-Capillary: 96 mg/dL (ref 70–99)
Glucose-Capillary: 90 mg/dL (ref 70–99)
Glucose-Capillary: 110 mg/dL — ABNORMAL HIGH (ref 70–99)

## 2019-11-11 LAB — ECHOCARDIOGRAM COMPLETE
Height: 61 in
Weight: 2912 oz

## 2019-11-11 MED ORDER — LIDOCAINE VISCOUS HCL 2 % MT SOLN
15.0000 mL | Freq: Once | OROMUCOSAL | Status: AC
  Administered 2019-11-11: 15 mL via ORAL
  Filled 2019-11-11: qty 15

## 2019-11-11 MED ORDER — ALUM & MAG HYDROXIDE-SIMETH 200-200-20 MG/5ML PO SUSP
30.0000 mL | Freq: Once | ORAL | Status: AC
  Administered 2019-11-11: 30 mL via ORAL
  Filled 2019-11-11: qty 30

## 2019-11-11 NOTE — Progress Notes (Signed)
PROGRESS NOTE  Kathryn Perry  DOB: July 02, 1986  PCP: Maryland Pink, MD [id number]  DOA: 10/18/2019  LOS: 23 days   Chief Complaint  Patient presents with  . Contractions  . Vaginal Bleeding   Brief narrative: 34 y.o.female with no significant PMH was admitted for OB service since 04/01 repeated vaginal bleeding and preterm contractions.  4/22, patient started having chest pain, pressure 7/10 while in bed with some shortness of breath.  It subsided in 15 to 20 minutes. 4/23, patient had similar symptoms as well as palpitation. Hospital service was consulted for further evaluation and management. CT angio chest was obtained which did not show any evidence of pulmonary embolism.  Subjective: Patient was seen and examined this morning.  Pleasant young African-American female, [redacted] weeks pregnant Not in distress.  Complains of mild chest pain/tenderness on touch anteriorly.  Assessment/Plan Atypical chest pain -Tender to touch anteriorly diffuse -No personal family risk factors of coronary disease -CTA chest negative for pulmonary embolism -Echocardiogram obtained today showed normal EF, no wall motion abnormality -Patient has hiatal hernia and history of reflux disease on PPI.  Continue the same. -No further cardiac work-up needed.  Intermittent palpitation -Patient reports palpitation as a child after which he had a prolonged monitoring.  No ultimately was identified. -She had very infrequent episodes of palpitations as an adult.  She had a similar episode of palpitation on 4/22. -If symptoms recur, after delivery, patient can follow-up with cardiology for further monitoring.  Acute anemia 2nd to vaginal bleeding -She reported less bleeding today, H/H remains stable   Pre-term contraction As per primary team.  Thank you for consultation.  We will sign off at this time.  Please reconsult as needed.  Antimicrobials: Anti-infectives (From admission, onward)   Start      Dose/Rate Route Frequency Ordered Stop   10/22/19 1130  metroNIDAZOLE (FLAGYL) tablet 500 mg     500 mg Oral Every 12 hours 10/22/19 1112 10/26/19 2151   10/19/19 1000  metroNIDAZOLE (FLAGYL) tablet 500 mg  Status:  Discontinued     500 mg Oral Every 12 hours 10/19/19 [id number] 10/20/19 2227        Code Status: Full Code   Diet Order            Diet NPO time specified  Diet effective midnight        Diet gestational carb mod Fluid consistency: Thin; Room service appropriate? Yes  Diet effective now              Infusions:    Scheduled Meds: . alum & mag hydroxide-simeth  30 mL Oral Once   And  . lidocaine  15 mL Oral Once  . cetirizine  10 mg Oral Daily  . docusate sodium  100 mg Oral Daily  . fluticasone  1 spray Each Nare Daily  . iron polysaccharides  150 mg Oral BID  . magnesium oxide  400 mg Oral Daily  . NIFEdipine  20 mg Oral Q6H  . omeprazole  20 mg Oral Daily  . prenatal multivitamin  1 tablet Oral Q1200  . simethicone  80 mg Oral TID  . sodium chloride flush  3 mL Intravenous Q12H    PRN meds: acetaminophen, calcium carbonate, diphenhydrAMINE, CBG monitoring **AND** insulin aspart, sodium chloride, zolpidem   Objective: Vitals:   11/11/19 0852 11/11/19 1154  BP: 110/73 119/60  Pulse: 90 90  Resp: 18 18  Temp: 98.2 F (36.8 C) 98.3 F (36.8 C)  SpO2: 99%  98%    Intake/Output Summary (Last 24 hours) at 11/11/2019 1514 Last data filed at 11/10/2019 2119 Gross per 24 hour  Intake 3 ml  Output --  Net 3 ml   Filed Weights   10/19/19 0024  Weight: 82.6 kg   Weight change:  Body mass index is 34.39 kg/m.   Physical Exam: General exam: Appears calm and comfortable.  Skin: No rashes, lesions or ulcers. HEENT: Atraumatic, normocephalic, supple neck, no obvious bleeding Lungs: Clear to auscultation bilaterally CVS: Regular rate and rhythm, no murmur.  Chest wall tenderness anteriorly diffuse GI/Abd soft, nontender, nondistended, bowel sound  present CNS: Alert, awake, oriented x3 Psychiatry: Mood appropriate Extremities: No pedal edema, no calf tenderness  Data Review: I have personally reviewed the laboratory data and studies available.  Recent Labs  Lab 11/08/19 0536 11/10/19 1321  WBC 5.4 6.9  NEUTROABS  --  3.3  HGB 8.7* 9.6*  HCT 27.7* 30.5*  MCV 89.6 89.7  PLT 190 216   Recent Labs  Lab 11/10/19 1321  NA 137  K 3.6  CL 108  CO2 19*  GLUCOSE 65*  BUN 9  CREATININE 0.61  CALCIUM 8.6*    Signed, Terrilee Croak, MD [facility] Pager: [phone] (Secure Chat preferred). 11/11/2019

## 2019-11-11 NOTE — Progress Notes (Addendum)
Patient ID: Kathryn Perry, female   DOB: 1986/07/18, 34 y.o.   MRN: [id number]  Hospital day # 23  pregnancy at [redacted]w[redacted]d  -Kathryn Brownis a 34 y.o.female, G3P1011, IUP of di/di twins at63.1weeks, presenting for vaginal bleeding and preterm contractions. S/P magnesium x 2 courses and rescue dose of BMZ 4/9. Procardia 10 mg q 6 hours.   S: Pt asleep in bed in NAD. Pt denies any complaints as of now, pt appears sleepy, but states feels better. States she still has [place] discharge when using the restroom. Pt denies feeling cxt over the night. Denies any leakage of fluids. Spirits are good. +FM, no other complaints, review POC pt verbalized understanding without questions. Pt had sudden and new onset of chest pain yesterday sinc feels better, was consulted with hospitalist, and cleared from a medical stand point.    O:  Today's Vitals   11/11/19 0010 11/11/19 0608 11/11/19 0830 11/11/19 0852  BP: (!) 109/52 111/61  110/73  Pulse: 92 95  90  Resp: 20 19  18   Temp: 98.4 F (36.9 C) 98.2 F (36.8 C)  98.2 F (36.8 C)  TempSrc: Oral Oral  Oral  SpO2: 97% 98%  99%  Weight:      Height:      PainSc:   0-No pain    Body mass index is 34.39 kg/m.        NST: [name] - 125s, + accels, no decels, moderate variability               Baby B - 135s, + accels, no decels, moderate variability      Ctx occasional 1 in 10      Uterus gravid and non-tender   Lungs: CTA      Abdomen: soft non-tender      Extremities: no significant edema and no signs of DVT           CBC Latest Ref Rng & Units 11/10/2019 11/08/2019 10/30/2019  WBC 4.0 - 10.5 K/uL 6.9 5.4 5.6  Hemoglobin 12.0 - 15.0 g/dL 9.6(L) 8.7(L) 9.2(L)  Hematocrit 36.0 - 46.0 % 30.5(L) 27.7(L) 29.2(L)  Platelets 150 - 400 K/uL 216 190 198    CBG (last 3)  Recent Labs    11/10/19 1846 11/10/19 2119 11/11/19 0608  GLUCAP 105* 102* 86   A: 33.0w with Hospital day # Kathryn Perry a 34 y.o.female, G3P1011,  with identical DiDi twins presenting  for vaginal bleeding and preterm contractions. S/P 12 hour magnesium for neuroprotection. H/O C/S.   P: Vaginal bleeding: Stable, currently still [place] spotting only when wiping, Started 10/18/2019, light first day of menses, new onset episode on 4/5 at 0400, now [place] minimal spotting. cervical length noted to be 3.4cm on 4/1, New recommendation due to recent US per MF to delivery if any red blood noted. Pelvic rest. May be wheeled off unit x 1 hour daily with RN.   Preterm CXT: Stable, occ cxt over night, SVE on 4/11 FT/Thick  S/P magnesium x 2 and rescue dose of BMZ, 10mg  procardia q 6 hours, toco PRN contractions, if unable to stop labor plan for repeat CS.   Fetal well-being: s/p  mag for neuroprotectant x2, US showed, breech/vtx, Qshift NST reactive for gestational age. BMZ complete 3/27-28, rescue dose 4/9. EFW 2.14llbs 39% for [name] and B is 2.9 lbs 14% on 3/26. Plan for Qshift NST. Repeat growth 32 wks. [place] on 4/16 resulted twin A, AFI WNL, breech, BPP 8/8, 4.4lbs  50%. Twin B AFI WNL, transverse, BPP 8/8, 3.6 lbs, 6%, S/D elevated at 5.49, no reverse flow.  New plan recommendation for BPP with dopplers weekly, scheduled for fridays until delivery. U/S 4/23 Both A &B Breech/vxt, 8/8 BPP, AFI WNl, B increased S/D ratio still with no end or reverse flow.   GDMA2: FBS this am was 786 not requiring isulin sliding scale, stable CBGs 2 hour PP and FBS to be checked has been unrearkable. Insulin sliding scale prn. Diabetic diet.  GBS:  Negative  Anemia: Hgb 9.2 on 4/12, Oral iron and MagOx, hgb 8.7 on 4/24, hemaferrin given on 4/23 Pt stable, asymptomatic.   Chest Pain: Resolved today, Reproducible on 4/23, no significant risk factor for coronary artery disease (FH, mother SVT, father HTN) TIMI=0, CTA resulted no pulmonary emboli, tiny right pleural effusion          uncertain etiology, small hiatal hernia, no other abnormalities. Negative troponin's. EKG sinus                bradycardia otherwise  begin. Echo performed today still pending results.    Dr Alesia Richards to be updated.   Kathryn Pick, MSN, CNM 11/11/19 10:52 AM  Attestation of Attending Supervision of Advanced Practitioner (CNM/NP): Evaluation and management procedures were performed by the Advanced Practitioner under my supervision and collaboration.  I have reviewed the Advanced Practitioner's note and chart, and I agree with the management and plan. I saw and examined patient at bedside and agree with above findings, assessment and plan as outlined above by [facility].   -Patient reports the twins are both moving but twin B has been moving less than before.  We discussed normal recent BPP for both twins, elevated dopplers for twin B and normal NST for both twins which are reassuring.  Continue with close follow up with NSTs Q shit.  Patient requests growth [place] of twins to be done at next ultrasound check (2 weeks from last growth ultrasound), order placed in.     -Reviewed patient's chart, 3 hr GTT was done about 6 days after second betamethasone (first course) therefore 3 hr GTT may not have been accurate due to betamethasone effects.  Will repeat 3 hr GTT tomorrow AM.  -Patient reports her chest pain is better than before, 5/10 intensity, reproducible on palpation of sternum.  Advised tylenol usage prn as well as GI cocktail trial.  Follow up on cardiac echo results.  - Discussed with patient she may ambulate as needed, defer heparin for DVT prophylaxis for now but plan to order it if patient is not ambulatory.  Bleeding precautions reviewed.   Dr. Alesia Richards. 11/11/19.  1325.

## 2019-11-11 NOTE — Progress Notes (Signed)
  Echocardiogram 2D Echocardiogram has been performed.  Kathryn Perry M 11/11/2019, 8:45 AM

## 2019-11-12 LAB — GLUCOSE, 3 HOUR GESTATIONAL: Glucose, GTT - 3 Hour: 143 mg/dL (ref 70–144)

## 2019-11-12 LAB — GLUCOSE, CAPILLARY
Glucose-Capillary: 99 mg/dL (ref 70–99)
Glucose-Capillary: 98 mg/dL (ref 70–99)

## 2019-11-12 LAB — GLUCOSE, FASTING GESTATIONAL: Glucose Tolerance, Fasting: 85 mg/dL

## 2019-11-12 LAB — GLUCOSE, 1 HOUR GESTATIONAL: Glucose Tolerance, 1 hour: 172 mg/dL (ref 70–189)

## 2019-11-12 LAB — GLUCOSE, 2 HOUR GESTATIONAL: Glucose Tolerance, 2 hour: 179 mg/dL — ABNORMAL HIGH (ref 70–164)

## 2019-11-12 MED ORDER — LACTATED RINGERS IV BOLUS
500.0000 mL | Freq: Once | INTRAVENOUS | Status: AC
  Administered 2019-11-12: 500 mL via INTRAVENOUS

## 2019-11-12 NOTE — Progress Notes (Signed)
LOS Day #24 14 [id number].[id number] [redacted]w[redacted]d IUP Di/Di twins admitted for vaginal bleeding and preterm ctx. S/P MgSO4, BMZ, IV Ferriheme.   S: Doing well, crocheting blanket in bed. Endorses FM x2, denies LOF. Reports scant, dark [place] discharge when voiding. Denies recurrence of chest pain.  O: Blood pressure 116/68, pulse (!) 105, temperature 98.3 F (36.8 C), temperature source Oral, resp. rate 18, height 5\' 1"  (1.549 m), weight 82.6 kg, last menstrual period 03/25/2019, SpO2 98 %  Twin A: 130 baseline/moderate/15x15/no decels Twin B: 145 baseline/moderate/15x15/no decels Ctx: irreg, mild per pt Gen: Alert and oriented x3 Cadiac: RRR Resp: clear in all fields Abd: soft, gravid, non-tender Ext: trace, non-pitting edema, no pain, tenderness, cords  CBC Latest Ref Rng & Units 11/10/2019 11/08/2019 10/30/2019  WBC 4.0 - 10.5 K/uL 6.9 5.4 5.6  Hemoglobin 12.0 - 15.0 g/dL 9.6(L) 8.7(L) 9.2(L)  Hematocrit 36.0 - 46.0 % 30.5(L) 27.7(L) 29.2(L)  Platelets 150 - 400 K/uL 216 190 198  3 hour GTT results pending  A: 17 [id number].F040223 [redacted]w[redacted]d IUP Di/Di twins admitted for vaginal bleeding and preterm ctx.  P:    Vaginal bleeding: Stable, minimal [place] spotting. Cervical length 3.4cm on 4/1. MFM recc deliver if any red blood returns. Pelvic rest. May be wheeled off unit x 1 hour daily with RN.   Preterm [id number], occ cxt, SVE on 4/11 FT/ThickS/P magnesium x 2 and rescue dose of BMZ, 10mg  procardiaq 6 hours, tocoPRN contractions, if unable to stop laborplan for repeat CS.   Fetal well-being:s/p mag for neuroprotectantx2, per [place] breech/vtx. Qshift NST. BMZ complete 3/27-28, rescue dose4/9  4/23 MFM U/S BPP w/ Doppler Twin A-Breech/ maternal right/ EFW/ largest pocket 5.08 BPP 8/8 Twin B-Vertex/ maternal left/EFW /largest pocket 4.94 increased S/D ratio still with no end or reverse flow.  GBS: Negative  Anemia: Hgb 9.6 on 4/23, Oral iron and MagOx, hgb 8.7 on 4/24, hemaferrin given on  4/23 Pt stable, asymptomatic.   Chest Pain: Resolved  EKG sinus bradycardia otherwise benign. Echo normal  Recommendations   -Continue weekly BPP and Doppler studies.  -Fetal growth assessment in 2 weeks @ 33+6  -We will make recommendations on timing of delivery after  next fetal growth assessment  -Plan to delivery via RC/S if bright red bleeding   Burman Foster, MSN, CNM 11/12/2019 12:41 PM

## 2019-11-13 NOTE — Progress Notes (Signed)
Nutrition   Follow-up with pt to answer questions about diet/snacks. Pt happy that diabetic restrictions have been lifted. Pt may order double protein portions/ snacks if wishes  Answered questions about nutrition should twins need NICU care  [facility] M.Fredderick Severance LDN Neonatal Nutrition Support Specialist/RD III

## 2019-11-13 NOTE — Progress Notes (Addendum)
Patient ID: Kathryn Perry, female   DOB: [birth year], 34 y.o.   MRN: [id number]  Hospital day # 25  pregnancy at [redacted]w[redacted]d  -Kathryn Perry a 34 y.o.female, G3P1011, IUP of di/di twins at33.2weeks, presenting for vaginal bleeding and preterm contractions. S/P magnesium x 2 courses and rescue dose of BMZ 4/9. Procardia 20 mg q 6 hours.   S: Pt resting in bed in NAD.  States she still has [place] discharge when using the restroom. Pt denies feeling cxt today. Denies any leakage of fluids. Spirits are good. +FM, no other complaints, review POC pt verbalized understanding without questions. Desires to speak with  dietician to discuss calorie count now that 3 hour results are WNL. O:  Today's Vitals   11/12/19 2251 11/12/19 2252 11/13/19 0620 11/13/19 0810  BP: 131/70 131/70 106/63 122/70  Pulse:  83  100  Resp:   18 18  Temp:   98.3 F (36.8 C) 98.3 F (36.8 C)  TempSrc:   Oral Oral  SpO2:   97% 97%  Weight:      Height:      PainSc:       Body mass index is 34.39 kg/m.        Most recent NST reviewed: [name] - 130s, + accels, no decels, moderate variability               Baby B - 135s, + accels, no decels, moderate variability      Uterus gravid and non-tender      Abdomen: soft non-tender      Extremities: no significant edema and no signs of DVT           CBC Latest Ref Rng & Units 11/10/2019 11/08/2019 10/30/2019  WBC 4.0 - 10.5 K/uL 6.9 5.4 5.6  Hemoglobin 12.0 - 15.0 g/dL 9.6(L) 8.7(L) 9.2(L)  Hematocrit 36.0 - 46.0 % 30.5(L) 27.7(L) 29.2(L)  Platelets 150 - 400 K/uL 216 190 198    CBG (last 3)  Recent Labs    11/11/19 2113 11/12/19 1512 11/12/19 2235  GLUCAP 110* 99 98   A: 33.2w with Hospital day # [place] a 34 y.o.female, G3P1011,  with identical DiDi twins presenting for vaginal bleeding and preterm contractions. S/P 12 hour magnesium for neuroprotection. H/O C/S.   P: Vaginal bleeding: Stable, currently still [place] spotting only when wiping, Started 10/18/2019,  light first day of menses, new onset episode on 4/5 at 0400, now [place] minimal spotting. cervical length noted to be 3.4cm on 4/1, New recommendation due to recent US per MF to delivery if any red blood noted. Pelvic rest. May be wheeled off unit x 1 hour daily with RN.   Preterm CXT: Stable some ctx overnight, resolved with IVF bolus and Procardia dose given 1 hour early,  S/P magnesium x 2 and rescue dose of BMZ, 20mg  procardia q 6 hours, toco PRN contractions, if unable to stop labor plan for repeat CS.   Fetal well-being: s/p  mag for neuroprotectant x2, US showed, breech/vtx, Qshift NST reactive for gestational age. BMZ complete 3/27-28, rescue dose  Plan for Qshift NST.  [place] on 4/23 resulted twin A, AFI WNL, breech, BPP 8/8,  Twin B AFI WNL, vertex, BPP 8/8, 3 S/D elevated, no reverse flow.  New plan recommendation for BPP with dopplers weekly, scheduled for fridays until delivery. Plan of care to be discussed after [place] for delivery timing/  GDMA2: 3 hour WNL. Dietary consult ordered for twin calorie count  GBS:  Negative          Anemia: Hgb 9.2 on 4/12, Oral iron and MagOx          Chest Pain: Now resolved. Recent complete work up WNL.     Kathryn Bene, MSN, CNM 11/05/2019, 8:22 AM   Cat 1 x 2 at 1615.  Occas ctxs on procardia 20mg  q6.  Will appreciate MFMs recommendations for delivery planning...ie.gestational age if all remains stable.  MFM scheduled to see pt on Friday with ultrasound.

## 2019-11-14 LAB — TYPE AND SCREEN
ABO/RH(D): O POS
Antibody Screen: NEGATIVE

## 2019-11-14 NOTE — Progress Notes (Addendum)
Hospital day #26  -Rainey Robies a 34 y.o.female, G3P1011, IUP of di/di twins at42w3dweeks, admitted for vaginal bleeding and preterm contractions. S/P magnesiumx 2 courses and rescue dose of BMZ 4/9. Procardia 20 mg q 6 hours  S: Resting in bed, spouse present for rounds and desires to know POC going forward.  Patient reports status unchanged, [place] spotting with wiping, occasional ctx unchanged, feels same chest pressure with ctx but not worrisome. Good FM x 2  O: BP 110/63 (BP Location: Right Arm)   Pulse 93   Temp 98.2 F (36.8 C) (Oral)   Resp 16   Ht 5\' 1"  (1.549 m)   Wt 82.6 kg   LMP 03/25/2019   SpO2 100%   BMI 34.39 kg/m    Last tracing FHT: Twin A Baseline: 135 bpm, Variability: Good {> 6 bpm), Accelerations: Reactive and Decelerations: Absent              Twin B Baseline: 140 bpm, Variability: Good {> 6 bpm), Accelerations: Reactive and Decelerations: Absent Toco: irregular, unchanged intensity SVE: deferred  A/P 33.3w  Hospital day #25 [facility] a 34 y.o.female, G3P1011, with identical DiDi twins admitted for vaginal bleeding and preterm contractions. S/P 12 hour magnesium for neuroprotection. H/O C/S.  P: Vaginal bleeding: Stable, currently still [place] spotting only when wiping, Started 10/18/2019, light first day of menses, new onset episode on 4/5 at 0400, now [place] minimal spotting. cervical length noted to be 3.4cm on 4/1, New recommendation due to recent US per MF to delivery if any red blood noted. Pelvic rest. May be wheeled off unit x 1 hour daily with RN.   Preterm [id number] with some ctx overnight, on Procardia 20 q 6H,S/P magnesium x 2 and rescue dose of BMZ, tocoPRN contractions, if unable to stop laborplan for repeat CS.   Fetal well-being:s/p mag for neuroprotectantx2, US showed, breech/vtx, Qshift NST reactive for gestational age. BMZ complete 3/27-28, rescue dose Plan for Qshift NST. [place] on 4/23 resulted twin A, AFI WNL, breech,  BPP 8/8,  Twin B AFI WNL, vertex, BPP 8/8, 3 S/D elevated, no reverse flow.  New plan recommendation for BPP with dopplers weekly, scheduled for fridays until delivery. Plan of care to be discussed after [place] for delivery timing  GDMA2: 3 hour WNL. Dietary consult completed for twin calorie count  GBS: Negative          Anemia: Hgb 9.6 on 4/23, Oral iron and MagOx          Chest Pain: Now resolved. Recent complete work up WNL.   - ROD: Plan rpt C/S, timing to be determined after MFM consult/growth sono this Friday, FOB and patient advised and agree   [name], CNM, MSN 11/14/2019, 10:45 AM  Tracing reviewed from 11oclock hour.  Cat 1 x 2 with infrequent ctxs.  Stable [place] discharge only with wiping per pt.  Questions answered.  Delivery plan pending MFM recommendations after ultrasound on Friday.  If pt remains pregnant beyond 34wks, will ask MFM about twice weekly dopplers d/t elevated dopplers of baby B.  I discussed this with the patient and she likes that idea.

## 2019-11-15 LAB — BPAM RBC
Blood Product Expiration Date: 202105262359
Unit Type and Rh: 5100

## 2019-11-15 LAB — TYPE AND SCREEN
ABO/RH(D): O POS
Antibody Screen: NEGATIVE
Unit division: 0

## 2019-11-15 NOTE — Progress Notes (Addendum)
Hospital day #27 Kathryn Perry a 34 y.o.female, G3P1011, IUP of di/di twins at13w3dweeks, admitted for vaginal bleeding and preterm contractions. S/P magnesiumx 2 courses and rescue dose of BMZ 4/9. Procardia20mg  q 6 hours  S:  Doing well, excited about upcoming U/S on 4/30 and looking forward to reaching 34 wks. Endorses FM x2, denies change in vaginal bleeding. Reports scant, Bear d/c. Reports feeling an occasional mild ctx.   O: Blood pressure 112/70, pulse 97, temperature 98.5 F (36.9 C), temperature source Oral, resp. rate 18, height 5\' 1"  (1.549 m), weight 82.6 kg, last menstrual period 03/25/2019, SpO2 98 % NST Twin A 135 baseline/moderate/15x15/no decels Twin B 125 baseline/moderate/15x15/no decels Ctx: irreg, mild per palpation. No change from baseline uterine activity  Gen: AO x3 Resp: unlabored Abd: soft, gravid, non-tender Ext: trace edema, no pain, tenderness, or cords  CBC Latest Ref Rng & Units 11/10/2019 11/08/2019 10/30/2019  WBC 4.0 - 10.5 K/uL 6.9 5.4 5.6  Hemoglobin 12.0 - 15.0 g/dL 9.6(L) 8.7(L) 9.2(L)  Hematocrit 36.0 - 46.0 % 30.5(L) 27.7(L) 29.2(L)  Platelets 150 - 400 K/uL 216 190 198   A: 55 [id number] [redacted]w[redacted]d IUP Di/Di twins admitted for vaginal bleeding and preterm ctx. Hospital Day #27 P:    Vaginal bleeding: Stable, minimal [place] spotting. Cervical length 3.4cm on 4/1. MFM recc deliver if any red blood returns. Pelvic rest. May be wheeled off unit x 1 hour daily with RN.   Preterm [id number], reports occ cxt, well managed w/ Procardia20mg  q 6 hours. SVE on 4/11 FT/Thick.S/P magnesium x 2 and rescue dose of BMZ, tocoPRN contractions, if unable to stop laborplan for repeat CS.   Fetal well-being:Cat 1 x2. S/P mag for neuroprotectantx2, per [place] on 4/23 breech/vtx. Qshift NST. BMZ complete 3/27-28, rescue dose4/9  4/23 MFM U/S BPP w/ Doppler Twin A-Breech/ maternal right/ EFW/ largest pocket 5.08 BPP 8/8 Twin B-Vertex/ maternal  left/EFW /largest pocket 4.94 increased S/D ratio still with no end or reverse flow.  GBS: Negative  Anemia: Hgb 9.6 on 4/23, Oral iron and MagOx, hgb 8.7 on 4/24, hemaferrin given on 4/23 Pt stable, asymptomatic.           Chest Pain:Now resolved. Recent complete work up [place] C/S, timing to be determined after MFM consult/growth sono on 4/30.     Burman Foster, MSN, CNM 11/15/2019 9:19 AM

## 2019-11-16 NOTE — Progress Notes (Addendum)
Patient ID: Kathryn Perry, female   DOB: Jan 07, 1986, 34 y.o.   MRN: [id number]  Hospital day # 29  pregnancy at [redacted]w[redacted]d  -Kathryn Brownis a 34 y.o.female, G3P1011, IUP of di/di twins at52.1weeks, presenting for vaginal bleeding and preterm contractions. S/P magnesium x 2 courses and rescue dose of BMZ 4/9. Procardia 10 mg q 6 hours.   S: Pt in bed in NAD, sleeping and woke for assessment. Endorses still having [place] discharge. . Pt endorses feeling occ cxt over the night. Denies any leakage of fluids. +FM, no other complaints, review POC pt verbalized understanding without questions. Pt admits to only feeling CP during a cxt and now relates the chest pain to cxts.   O:  Today's Vitals   11/16/19 1932 11/16/19 2036 11/16/19 2322 11/17/19 0311  BP: (!) 141/73  (!) 98/55   Pulse: (!) 139  89   Resp: 19  18   Temp: 98.6 F (37 C)  97.6 F (36.4 C)   TempSrc: Oral  Oral   SpO2: 98%  97%   Weight:      Height:      PainSc:  0-No pain  Asleep   Body mass index is 34.39 kg/m.        NST: [name] - 130s, + accels, no decels, moderate variability               Baby B - 130s, + accels, no decels, moderate variability      Ctx occasional 1 in 10      Uterus gravid and non-tender   Lungs: CTA      Abdomen: soft non-tender      Extremities: no significant edema and no signs of DVT           CBC Latest Ref Rng & Units 11/10/2019 11/08/2019 10/30/2019  WBC 4.0 - 10.5 K/uL 6.9 5.4 5.6  Hemoglobin 12.0 - 15.0 g/dL 9.6(L) 8.7(L) 9.2(L)  Hematocrit 36.0 - 46.0 % 30.5(L) 27.7(L) 29.2(L)  Platelets 150 - 400 K/uL 216 190 198   A: 36w with Hospital day # [place] a 34 y.o.female, G3P1011,  with identical DiDi twins presenting for vaginal bleeding and preterm contractions. S/P 12 hour magnesium for neuroprotection. H/O C/S.   P: Vaginal bleeding: Stable, currently still [place] spotting only when wiping, Started 10/18/2019, light first day of menses, new onset episode on 4/5 at 0400, now [place]  minimal spotting. cervical length noted to be 3.4cm on 4/1, New recommendation to revaluate tomorrow with growth scan to conclude timing of delivery. Pelvic rest. May be wheeled off unit x 1 hour daily with RN.   Preterm CXT: Stable, occ cxt over night, SVE on 4/11 FT/Thick  S/P magnesium x 2 and rescue dose of BMZ, 20mg  procardia q 6 hours, toco PRN contractions, if unable to stop labor plan for repeat CS.   Fetal well-being: s/p  mag for neuroprotectant x2, US showed, breech/vtx, Qshift NST reactive for gestational age. BMZ complete 3/27-28, rescue dose 4/9. EFW 2.14llbs 39% for [name] and B is 2.9 lbs 14% on 3/26. Plan for Qshift NST. Repeat growth 32 wks. [place] on 4/16 resulted twin A, AFI WNL, breech, BPP 8/8, 4.4lbs 50%. Twin B AFI WNL, transverse, BPP 8/8, 3.6 lbs, 6%, S/D elevated at 5.49, no reverse flow.  BPP with dopplers weekly, scheduled for fridays until delivery. U/S 4/23 Both A &B Breech/vxt, 8/8 BPP, AFI WNl, B increased S/D ratio still with no end or reverse flow.  GBS:  Negative  Anemia: Hgb 9.2 on 4/12, Oral iron and MagOx, hgb 8.7 on 4/24, hemaferrin given on 4/23 Pt stable, asymptomatic.   Chest Pain: Resolved today, Reproducible on 4/23, no significant risk factor for coronary artery disease (FH, mother SVT, father HTN) TIMI=0, CTA resulted no pulmonary emboli, tiny right pleural effusion          uncertain etiology, small hiatal hernia, no other abnormalities. Negative troponin's. EKG sinus                bradycardia otherwise begin. Echo 4/24 normal.    Dr Charlesetta Garibaldi to be updated at 0700 when she assumes care of pt.   [facility], MSN, CNM 11/17/19 6:02 AM  [place] with dopplers today and BPP [name] breech normal fluid 10/10 Baby B breech elevated dopplers and 8/10 -2 for breating Plan is twice weekly dopplers and BPPs if they remain reassuring will deliver at 36 weeks Stop tocolysis and bed rest Pt and husband agree with the plan Thank you Dr Donalee Citrin for consultation

## 2019-11-16 NOTE — Progress Notes (Addendum)
Patient ID: Kathryn Perry, female   DOB: [birth year], 34 y.o.   MRN: [id number]  Hospital day # 28  pregnancy at [redacted]w[redacted]d  -Kathryn Brownis a 34 y.o.female, G3P1011, IUP of di/di twins at72.1weeks, presenting for vaginal bleeding and preterm contractions. S/P magnesium x 2 courses and rescue dose of BMZ 4/9. Procardia 10 mg q 6 hours.   S: Pt in bed in NAD was up to the bathroom and tolerated well. Endorses still having [place] discharge. . Pt endorses feeling occ cxt over the night. Denies any leakage of fluids. Spirits are good. +FM, no other complaints, review POC pt verbalized understanding without questions. Pt admits to only feeling CP during a cxt and now relates the chest pain to cxts.  O:  Today's Vitals   11/15/19 1930 11/15/19 1956 11/16/19 0755 11/16/19 0802  BP:  111/60  114/62  Pulse:  98  (!) 115  Resp:  17  18  Temp:  97.7 F (36.5 C)  98.6 F (37 C)  TempSrc:  Oral  Oral  SpO2:  99%  95%  Weight:      Height:      PainSc: 0-No pain  0-No pain    Body mass index is 34.39 kg/m.        NST: [name] - 125s, + accels, no decels, moderate variability               Baby B - 135s, + accels, no decels, moderate variability      Ctx occasional 1 in 10      Uterus gravid and non-tender   Lungs: CTA      Abdomen: soft non-tender      Extremities: no significant edema and no signs of DVT           CBC Latest Ref Rng & Units 11/10/2019 11/08/2019 10/30/2019  WBC 4.0 - 10.5 K/uL 6.9 5.4 5.6  Hemoglobin 12.0 - 15.0 g/dL 9.6(L) 8.7(L) 9.2(L)  Hematocrit 36.0 - 46.0 % 30.5(L) 27.7(L) 29.2(L)  Platelets 150 - 400 K/uL 216 190 198   A: 36.5w with Hospital day # [place] a 34 y.o.female, G3P1011,  with identical DiDi twins presenting for vaginal bleeding and preterm contractions. S/P 12 hour magnesium for neuroprotection. H/O C/S.   P: Vaginal bleeding: Stable, currently still [place] spotting only when wiping, Started 10/18/2019, light first day of menses, new onset episode on 4/5  at 0400, now [place] minimal spotting. cervical length noted to be 3.4cm on 4/1, New recommendation to revaluate tomorrow with growth scan to conclude timing of delivery. Pelvic rest. May be wheeled off unit x 1 hour daily with RN.   Preterm CXT: Stable, occ cxt over night, SVE on 4/11 FT/Thick  S/P magnesium x 2 and rescue dose of BMZ, 20mg  procardia q 6 hours, toco PRN contractions, if unable to stop labor plan for repeat CS.   Fetal well-being: s/p  mag for neuroprotectant x2, US showed, breech/vtx, Qshift NST reactive for gestational age. BMZ complete 3/27-28, rescue dose 4/9. EFW 2.14llbs 39% for [name] and B is 2.9 lbs 14% on 3/26. Plan for Qshift NST. Repeat growth 32 wks. [place] on 4/16 resulted twin A, AFI WNL, breech, BPP 8/8, 4.4lbs 50%. Twin B AFI WNL, transverse, BPP 8/8, 3.6 lbs, 6%, S/D elevated at 5.49, no reverse flow.  BPP with dopplers weekly, scheduled for fridays until delivery. U/S 4/23 Both A &B Breech/vxt, 8/8 BPP, AFI WNl, B increased S/D ratio still with no end  or reverse flow.   GBS:  Negative  Anemia: Hgb 9.2 on 4/12, Oral iron and MagOx, hgb 8.7 on 4/24, hemaferrin given on 4/23 Pt stable, asymptomatic.   Chest Pain: Resolved today, Reproducible on 4/23, no significant risk factor for coronary artery disease (FH, mother SVT, father HTN) TIMI=0, CTA resulted no pulmonary emboli, tiny right pleural effusion          uncertain etiology, small hiatal hernia, no other abnormalities. Negative troponin's. EKG sinus                bradycardia otherwise begin. Echo 4/24 normal.    Dr Charlesetta Garibaldi to be updated.   [facility], MSN, CNM 11/16/19 9:47 AM     Pt seen and examined.  Plan [place] tomorrow and growth and dopplers will determine frequency of dopplers and gestational age for delivery

## 2019-11-17 ENCOUNTER — Inpatient Hospital Stay (HOSPITAL_BASED_OUTPATIENT_CLINIC_OR_DEPARTMENT_OTHER): Payer: BC Managed Care – PPO

## 2019-11-17 DIAGNOSIS — O30043 Twin pregnancy, dichorionic/diamniotic, third trimester: Secondary | ICD-10-CM | POA: Diagnosis not present

## 2019-11-17 DIAGNOSIS — Z3A33 33 weeks gestation of pregnancy: Secondary | ICD-10-CM | POA: Diagnosis not present

## 2019-11-17 DIAGNOSIS — O365931 Maternal care for other known or suspected poor fetal growth, third trimester, fetus 1: Secondary | ICD-10-CM | POA: Diagnosis not present

## 2019-11-17 LAB — TYPE AND SCREEN
ABO/RH(D): O POS
Antibody Screen: NEGATIVE

## 2019-11-18 NOTE — Progress Notes (Signed)
Pt seen and examined.   Stable. Reviewed CNM note and agree.  Meridee Score, MD, Cherlynn June

## 2019-11-18 NOTE — Progress Notes (Signed)
Patient ID: Kathryn Perry, female   DOB: [birth year], 34 y.o.   MRN: [id number]  Hospital day # 30  pregnancy at [redacted]w[redacted]d  -Kathryn Perry a 34 y.o.female, G3P1011, IUP of di/di twins at29.5weeks, presenting for vaginal bleeding and preterm contractions. S/P magnesium x 2 courses and rescue dose of BMZ 4/9. Procardia 10 mg q 6 hours.   S: Pt in bed in NAD, sleeping and woke for assessment. Endorses still having [place] discharge. Pt endorses feeling occ cxt over the night. Denies any leakage of fluids. +FM, no other complaints, review POC pt verbalized understanding without questions.  O:  Today's Vitals   11/17/19 2158 11/18/19 0647 11/18/19 0652 11/18/19 0755  BP: 118/62 118/68  109/73  Pulse: 79 81  87  Resp: 19 18  18   Temp: 98.3 F (36.8 C) 98 F (36.7 C)  98.2 F (36.8 C)  TempSrc: Oral Oral  Oral  SpO2: 97% 100%  99%  Weight:      Height:      PainSc:   0-No pain    Body mass index is 34.39 kg/m.        NST: [name] - 125s, + accels, no decels, moderate variability               Baby B - 130s, + accels, no decels, moderate variability      Ctx occasional 1 in 10      Uterus gravid and non-tender   Lungs: CTA      Abdomen: soft non-tender      Extremities: no significant edema and no signs of DVT           CBC Latest Ref Rng & Units 11/10/2019 11/08/2019 10/30/2019  WBC 4.0 - 10.5 K/uL 6.9 5.4 5.6  Hemoglobin 12.0 - 15.0 g/dL 9.6(L) 8.7(L) 9.2(L)  Hematocrit 36.0 - 46.0 % 30.5(L) 27.7(L) 29.2(L)  Platelets 150 - 400 K/uL 216 190 198   A: 34.0w with Hospital day # Kathryn Perry a 34 y.o.female, G3P1011,  with identical DiDi twins presenting @ 29.5 for vaginal bleeding and preterm contractions. S/P 12 hour magnesium for neuroprotection. H/O C/S.   P: Vaginal bleeding: Stable, currently still [place] spotting only when wiping, Started 10/18/2019, light first day of menses, new onset episode on 4/5 at 0400, now [place] minimal spotting. cervical length noted to be 3.4cm on 4/1,  Pelvic rest. May be wheeled off unit x 1 hour daily with RN. Plan is twice weekly dopplers and BPPs if they remain reassuring will deliver at 36 weeks  Preterm CXT: Stable, occ cxt over night, SVE on 4/11 FT/Thick  S/P magnesium x 2 and rescue dose of BMZ, Stop tocolysis and bed rest, toco PRN contractions,  plan for repeat CS.   Fetal well-being: s/p  mag for neuroprotectant x2, US showed, breech/vtx, Qshift NST reactive for gestational age. BMZ complete 3/27-28, rescue dose 4/9. EFW 2.14llbs 39% for [name] and B is 2.9 lbs 14% on 3/26. Plan for Qshift NST. Repeat growth 32 wks. [place] on 4/16 resulted twin A, AFI WNL, breech, BPP 8/8, 4.4lbs 50%. Twin B AFI WNL, transverse, BPP 8/8, 3.6 lbs, 6%, S/D elevated at 5.49, no reverse flow.  BPP with dopplers weekly, scheduled for fridays until delivery. U/S 4/23 Both A &B Breech/vxt, 8/8 BPP, AFI WNl, B increased S/D ratio still with no end or reverse flow. U/S 4/30 A; Breech, BPP 10/10; B is breech, BPP 8/10, -2 for breathing.   GBS:  Negative  Anemia: Hgb 9.2 on 4/12, Oral iron and MagOx, hgb 8.7 on 4/24, hemaferrin given on 4/23 Pt stable, asymptomatic.   Chest Pain: Resolved today, Reproducible on 4/23, no significant risk factor for coronary artery disease (FH, mother SVT, father HTN) TIMI=0, CTA resulted no pulmonary emboli, tiny right pleural effusion          uncertain etiology, small hiatal hernia, no other abnormalities. Negative troponin's. EKG sinus                bradycardia otherwise begin. Echo 4/24 normal.    [facility], MSN, CNM 11/18/19 9:05 AM

## 2019-11-19 NOTE — Progress Notes (Signed)
Pt seen.  Sleeping quietly. Reviewed and agree with CNM note.  Pt reported feeling occasional contractions overnight. Reviewed NSTs.  Category I x2.  Irregular contractions q7-12 minute.  Continue current management. [facility] to resume care 7 am 11/20/19.

## 2019-11-19 NOTE — Progress Notes (Signed)
Patient ID: Kathryn Perry, female   DOB: [birth year], 34 y.o.   MRN: [id number]  Hospital day # 31  pregnancy at [redacted]w[redacted]d  -Kathryn Perry a 34 y.o.female, G3P1011, IUP of di/di twins at29.5weeks, presenting for vaginal bleeding and preterm contractions. S/P magnesium x 2 courses and rescue dose of BMZ 4/9. Procardia 10 mg q 6 hours.   S: Pt in bed in NAD, pt awake and knitting, she is looking forward to delivery at 36 weeks. Endorses still having Spina discharge. Pt endorses feeling occ cxt over the night, but very mild, she thinks she is more having muscle spasm when she moves and feels charlie hoarsens  In her abdomen versus cxt. . Denies any leakage of fluids. +FM, no other complaints, review POC pt verbalized understanding without questions.   O:  Today's Vitals   11/18/19 2006 11/18/19 2200 11/19/19 0827 11/19/19 0847  BP: 111/69 123/71 120/75   Pulse: 92 92 85   Resp: 18 16 16    Temp: 98.6 F (37 C) 97.9 F (36.6 C) (!) 97.5 F (36.4 C)   TempSrc: Oral Oral Oral   SpO2: 99% 98% 99%   Weight:      Height:      PainSc:    0-No pain   Body mass index is 34.39 kg/m.        NST: [name] - 125s, + accels, no decels, moderate variability               Baby B - 130s, + accels, no decels, moderate variability      Ctx occasional 1 in 10      Uterus gravid and non-tender   Lungs: CTA      Abdomen: soft non-tender      Extremities: no significant edema and no signs of DVT           CBC Latest Ref Rng & Units 11/10/2019 11/08/2019 10/30/2019  WBC 4.0 - 10.5 K/uL 6.9 5.4 5.6  Hemoglobin 12.0 - 15.0 g/dL 9.6(L) 8.7(L) 9.2(L)  Hematocrit 36.0 - 46.0 % 30.5(L) 27.7(L) 29.2(L)  Platelets 150 - 400 K/uL 216 190 198   A: 34 y.o. with Hospital day # [place] a 34 y.o.female, G3P1011,  with identical DiDi twins presenting @ 29.5 for vaginal bleeding and preterm contractions. S/P 12 hour magnesium for neuroprotection. H/O C/S.   P: Vaginal bleeding: Stable, currently still [place] spotting  only when wiping, Started 10/18/2019, light first day of menses, new onset episode on 4/5 at 0400, now [place] minimal spotting. cervical length noted to be 3.4cm on 4/1, Pelvic rest. May be wheeled off unit x 1 hour daily with RN. Plan is twice weekly dopplers and BPPs if they remain reassuring will deliver at 36 weeks  Preterm CXT: Stable, occ cxt over night, SVE on 4/11 FT/Thick  S/P magnesium x 2 and rescue dose of BMZ, Stop tocolysis and bed rest, toco PRN contractions,  plan for repeat CS.   Fetal well-being: s/p  mag for neuroprotectant x2, US showed, breech/vtx, Qshift NST reactive for gestational age. BMZ complete 3/27-28, rescue dose 4/9. EFW 2.14llbs 39% for [name] and B is 2.9 lbs 14% on 3/26. Plan for Qshift NST. Repeat growth 32 wks. [place] on 4/16 resulted twin A, AFI WNL, breech, BPP 8/8, 4.4lbs 50%. Twin B AFI WNL, transverse, BPP 8/8, 3.6 lbs, 6%, S/D elevated at 5.49, no reverse flow.  BPP with dopplers weekly, scheduled for fridays until delivery. U/S 4/23 Both A &B Breech/vxt,  8/8 BPP, AFI WNl, B increased S/D ratio still with no end or reverse flow. U/S 4/30 A; Breech, BPP 10/10; B is breech, BPP 8/10, -2 for breathing.   GBS:  Negative  Anemia: Hgb 9.2 on 4/12, Oral iron and MagOx, hgb 8.7 on 4/24, hemaferrin given on 4/23 Pt stable, asymptomatic.   Chest Pain: Resolved, Reproducible on 4/23, no significant risk factor for coronary artery disease (FH, mother SVT, father HTN) TIMI=0, CTA resulted no pulmonary emboli, tiny right pleural effusion          uncertain etiology, small hiatal hernia, no other abnormalities. Negative troponin's. EKG sinus                bradycardia otherwise begin. Echo 4/24 normal.    [name], MSN, [facility] 11/19/19 9:33 AM

## 2019-11-20 ENCOUNTER — Inpatient Hospital Stay (HOSPITAL_COMMUNITY): Payer: BC Managed Care – PPO | Admitting: Anesthesiology

## 2019-11-20 ENCOUNTER — Encounter (HOSPITAL_COMMUNITY): Payer: Self-pay | Admitting: Internal Medicine

## 2019-11-20 ENCOUNTER — Inpatient Hospital Stay (HOSPITAL_BASED_OUTPATIENT_CLINIC_OR_DEPARTMENT_OTHER): Payer: BC Managed Care – PPO

## 2019-11-20 ENCOUNTER — Encounter (HOSPITAL_COMMUNITY)
Admission: AD | Disposition: A | Discharge status: Home / Self Care | Payer: Self-pay | Source: Home / Self Care | Attending: Obstetrics & Gynecology

## 2019-11-20 DIAGNOSIS — Z3A34 34 weeks gestation of pregnancy: Secondary | ICD-10-CM | POA: Diagnosis not present

## 2019-11-20 DIAGNOSIS — O30043 Twin pregnancy, dichorionic/diamniotic, third trimester: Secondary | ICD-10-CM

## 2019-11-20 DIAGNOSIS — O4693 Antepartum hemorrhage, unspecified, third trimester: Secondary | ICD-10-CM

## 2019-11-20 LAB — CBC WITH DIFFERENTIAL/PLATELET
Abs Immature Granulocytes: 0.08 10*3/uL — ABNORMAL HIGH (ref 0.00–0.07)
Abs Immature Granulocytes: 0.07 10*3/uL (ref 0.00–0.07)
Basophils Absolute: 0 10*3/uL (ref 0.0–0.1)
Basophils Absolute: 0 10*3/uL (ref 0.0–0.1)
Basophils Relative: 0 %
Basophils Relative: 0 %
Eosinophils Absolute: 0 10*3/uL (ref 0.0–0.5)
Eosinophils Absolute: 0 10*3/uL (ref 0.0–0.5)
Eosinophils Relative: 1 %
Eosinophils Relative: 0 %
HCT: 29.9 % — ABNORMAL LOW (ref 36.0–46.0)
HCT: 29.5 % — ABNORMAL LOW (ref 36.0–46.0)
Hemoglobin: 9.4 g/dL — ABNORMAL LOW (ref 12.0–15.0)
Hemoglobin: 9.4 g/dL — ABNORMAL LOW (ref 12.0–15.0)
Immature Granulocytes: 2 %
Immature Granulocytes: 1 %
Lymphocytes Relative: 37 %
Lymphocytes Relative: 14 %
Lymphs Abs: 1.9 10*3/uL (ref 0.7–4.0)
Lymphs Abs: 1.3 10*3/uL (ref 0.7–4.0)
MCH: 28.7 pg (ref 26.0–34.0)
MCH: 28.6 pg (ref 26.0–34.0)
MCHC: 31.4 g/dL (ref 30.0–36.0)
MCHC: 31.9 g/dL (ref 30.0–36.0)
MCV: 91.2 fL (ref 80.0–100.0)
MCV: 89.7 fL (ref 80.0–100.0)
Monocytes Absolute: 0.6 10*3/uL (ref 0.1–1.0)
Monocytes Absolute: 0.5 10*3/uL (ref 0.1–1.0)
Monocytes Relative: 11 %
Monocytes Relative: 5 %
Neutro Abs: 2.5 10*3/uL (ref 1.7–7.7)
Neutro Abs: 7.7 10*3/uL (ref 1.7–7.7)
Neutrophils Relative %: 49 %
Neutrophils Relative %: 80 %
Platelets: 167 10*3/uL (ref 150–400)
Platelets: 172 10*3/uL (ref 150–400)
RBC: 3.28 MIL/uL — ABNORMAL LOW (ref 3.87–5.11)
RBC: 3.29 MIL/uL — ABNORMAL LOW (ref 3.87–5.11)
RDW: 16.3 % — ABNORMAL HIGH (ref 11.5–15.5)
RDW: 16.1 % — ABNORMAL HIGH (ref 11.5–15.5)
WBC: 5.1 10*3/uL (ref 4.0–10.5)
WBC: 9.5 10*3/uL (ref 4.0–10.5)
nRBC: 0.4 % — ABNORMAL HIGH (ref 0.0–0.2)
nRBC: 0 % (ref 0.0–0.2)

## 2019-11-20 LAB — AMNISURE RUPTURE OF MEMBRANE (ROM) NOT AT ARMC: Amnisure ROM: NEGATIVE

## 2019-11-20 LAB — PREPARE RBC (CROSSMATCH)

## 2019-11-20 SURGERY — Surgical Case
Anesthesia: Spinal

## 2019-11-20 MED ORDER — LACTATED RINGERS IV SOLN: INTRAVENOUS | Status: DC

## 2019-11-20 MED ORDER — COCONUT OIL OIL
1.0000 "application " | TOPICAL_OIL | Status: DC | PRN
  Administered 2019-11-21: 1 via TOPICAL

## 2019-11-20 MED ORDER — SIMETHICONE 80 MG PO CHEW: 80.0000 mg | CHEWABLE_TABLET | ORAL | Status: DC | PRN

## 2019-11-20 MED ORDER — ACETAMINOPHEN 10 MG/ML IV SOLN
1000.0000 mg | Freq: Once | INTRAVENOUS | Status: DC | PRN
  Administered 2019-11-20: 1000 mg via INTRAVENOUS

## 2019-11-20 MED ORDER — NALOXONE HCL 4 MG/10ML IJ SOLN
1.0000 ug/kg/h | INTRAVENOUS | Status: DC | PRN
  Filled 2019-11-20: qty 5

## 2019-11-20 MED ORDER — SOD CITRATE-CITRIC ACID 500-334 MG/5ML PO SOLN
ORAL | Status: AC
  Filled 2019-11-20: qty 30

## 2019-11-20 MED ORDER — PHENYLEPHRINE HCL-NACL 20-0.9 MG/250ML-% IV SOLN
INTRAVENOUS | Status: DC | PRN
  Administered 2019-11-20: 60 ug/min via INTRAVENOUS

## 2019-11-20 MED ORDER — PHENYLEPHRINE HCL-NACL 20-0.9 MG/250ML-% IV SOLN
INTRAVENOUS | Status: AC
  Filled 2019-11-20: qty 250

## 2019-11-20 MED ORDER — MORPHINE SULFATE (PF) 0.5 MG/ML IJ SOLN
INTRAMUSCULAR | Status: DC | PRN
  Administered 2019-11-20: .15 mg via INTRATHECAL

## 2019-11-20 MED ORDER — DIPHENHYDRAMINE HCL 25 MG PO CAPS: 25.0000 mg | ORAL_CAPSULE | Freq: Four times a day (QID) | ORAL | Status: DC | PRN

## 2019-11-20 MED ORDER — BUPIVACAINE HCL (PF) 0.5 % IJ SOLN
INTRAMUSCULAR | Status: AC
  Filled 2019-11-20: qty 30

## 2019-11-20 MED ORDER — SODIUM CHLORIDE 0.9 % IV SOLN: INTRAVENOUS | Status: DC | PRN

## 2019-11-20 MED ORDER — FENTANYL CITRATE (PF) 100 MCG/2ML IJ SOLN
INTRAMUSCULAR | Status: AC
  Filled 2019-11-20: qty 2

## 2019-11-20 MED ORDER — OXYTOCIN 40 UNITS IN NORMAL SALINE INFUSION - SIMPLE MED: 2.5000 [IU]/h | INTRAVENOUS | Status: AC

## 2019-11-20 MED ORDER — NALBUPHINE HCL 10 MG/ML IJ SOLN: 5.0000 mg | INTRAMUSCULAR | Status: DC | PRN

## 2019-11-20 MED ORDER — CEFAZOLIN SODIUM-DEXTROSE 2-3 GM-%(50ML) IV SOLR
INTRAVENOUS | Status: DC | PRN
Start: 2019-11-20 — End: 2019-11-20
  Administered 2019-11-20: 2 g via INTRAVENOUS

## 2019-11-20 MED ORDER — SODIUM CHLORIDE 0.9% IV SOLUTION
Freq: Once | INTRAVENOUS | Status: AC
  Administered 2019-11-20: 30 mL/h via INTRAVENOUS

## 2019-11-20 MED ORDER — DEXAMETHASONE SODIUM PHOSPHATE 10 MG/ML IJ SOLN
INTRAMUSCULAR | Status: AC
  Filled 2019-11-20: qty 1

## 2019-11-20 MED ORDER — NALBUPHINE HCL 10 MG/ML IJ SOLN: 5.0000 mg | Freq: Once | INTRAMUSCULAR | Status: DC | PRN

## 2019-11-20 MED ORDER — DEXAMETHASONE SODIUM PHOSPHATE 10 MG/ML IJ SOLN
INTRAMUSCULAR | Status: DC | PRN
  Administered 2019-11-20: 10 mg via INTRAVENOUS

## 2019-11-20 MED ORDER — DIPHENHYDRAMINE HCL 50 MG/ML IJ SOLN
12.5000 mg | INTRAMUSCULAR | Status: DC | PRN
  Administered 2019-11-20: 12.5 mg via INTRAVENOUS

## 2019-11-20 MED ORDER — ONDANSETRON HCL 4 MG/2ML IJ SOLN
INTRAMUSCULAR | Status: AC
  Filled 2019-11-20: qty 2

## 2019-11-20 MED ORDER — FENTANYL CITRATE (PF) 100 MCG/2ML IJ SOLN
25.0000 ug | INTRAMUSCULAR | Status: DC | PRN
  Administered 2019-11-20: 25 ug via INTRAVENOUS

## 2019-11-20 MED ORDER — MENTHOL 3 MG MT LOZG: 1.0000 | LOZENGE | OROMUCOSAL | Status: DC | PRN

## 2019-11-20 MED ORDER — LACTATED RINGERS IV SOLN: INTRAVENOUS | Status: DC | PRN

## 2019-11-20 MED ORDER — ACETAMINOPHEN 10 MG/ML IV SOLN
INTRAVENOUS | Status: AC
  Filled 2019-11-20: qty 100

## 2019-11-20 MED ORDER — HYDROMORPHONE HCL 1 MG/ML IJ SOLN
0.2000 mg | INTRAMUSCULAR | Status: DC | PRN
  Administered 2019-11-21: 0.4 mg via INTRAVENOUS
  Filled 2019-11-20: qty 1

## 2019-11-20 MED ORDER — WITCH HAZEL-GLYCERIN EX PADS: 1.0000 "application " | MEDICATED_PAD | CUTANEOUS | Status: DC | PRN

## 2019-11-20 MED ORDER — DIPHENHYDRAMINE HCL 25 MG PO CAPS: 25.0000 mg | ORAL_CAPSULE | ORAL | Status: DC | PRN

## 2019-11-20 MED ORDER — PHENYLEPHRINE HCL (PRESSORS) 10 MG/ML IV SOLN
INTRAVENOUS | Status: DC | PRN
  Administered 2019-11-20: 120 ug via INTRAVENOUS

## 2019-11-20 MED ORDER — PROMETHAZINE HCL 25 MG/ML IJ SOLN: 6.2500 mg | INTRAMUSCULAR | Status: DC | PRN

## 2019-11-20 MED ORDER — NALOXONE HCL 0.4 MG/ML IJ SOLN: 0.4000 mg | INTRAMUSCULAR | Status: DC | PRN

## 2019-11-20 MED ORDER — ACETAMINOPHEN 500 MG PO TABS
1000.0000 mg | ORAL_TABLET | Freq: Four times a day (QID) | ORAL | Status: DC
  Administered 2019-11-20 – 2019-11-23 (×11): 1000 mg via ORAL
  Filled 2019-11-20 (×12): qty 2

## 2019-11-20 MED ORDER — PRENATAL MULTIVITAMIN CH
1.0000 | ORAL_TABLET | Freq: Every day | ORAL | Status: DC
  Administered 2019-11-21 – 2019-11-23 (×4): 1 via ORAL
  Filled 2019-11-20 (×3): qty 1

## 2019-11-20 MED ORDER — PHENYLEPHRINE 40 MCG/ML (10ML) SYRINGE FOR IV PUSH (FOR BLOOD PRESSURE SUPPORT)
PREFILLED_SYRINGE | INTRAVENOUS | Status: AC
  Filled 2019-11-20: qty 10

## 2019-11-20 MED ORDER — TETANUS-DIPHTH-ACELL PERTUSSIS 5-2.5-18.5 LF-MCG/0.5 IM SUSP: 0.5000 mL | Freq: Once | INTRAMUSCULAR | Status: DC

## 2019-11-20 MED ORDER — SIMETHICONE 80 MG PO CHEW
80.0000 mg | CHEWABLE_TABLET | Freq: Three times a day (TID) | ORAL | Status: DC
  Administered 2019-11-20 – 2019-11-23 (×11): 80 mg via ORAL
  Filled 2019-11-20 (×8): qty 1

## 2019-11-20 MED ORDER — SODIUM CHLORIDE 0.9% FLUSH: 3.0000 mL | INTRAVENOUS | Status: DC | PRN

## 2019-11-20 MED ORDER — ACETAMINOPHEN 500 MG PO TABS: 1000.0000 mg | ORAL_TABLET | Freq: Four times a day (QID) | ORAL | Status: DC

## 2019-11-20 MED ORDER — ZOLPIDEM TARTRATE 5 MG PO TABS: 5.0000 mg | ORAL_TABLET | Freq: Every evening | ORAL | Status: DC | PRN

## 2019-11-20 MED ORDER — KETOROLAC TROMETHAMINE 30 MG/ML IJ SOLN
30.0000 mg | Freq: Four times a day (QID) | INTRAMUSCULAR | Status: DC | PRN
Start: 2019-11-20 — End: 2019-11-20

## 2019-11-20 MED ORDER — OXYTOCIN 40 UNITS IN NORMAL SALINE INFUSION - SIMPLE MED
INTRAVENOUS | Status: DC | PRN
  Administered 2019-11-20: 40 [IU] via INTRAVENOUS

## 2019-11-20 MED ORDER — BUPIVACAINE IN DEXTROSE 0.75-8.25 % IT SOLN
INTRATHECAL | Status: DC | PRN
  Administered 2019-11-20: 1.6 mL via INTRATHECAL

## 2019-11-20 MED ORDER — ONDANSETRON HCL 4 MG/2ML IJ SOLN: 4.0000 mg | Freq: Three times a day (TID) | INTRAMUSCULAR | Status: DC | PRN

## 2019-11-20 MED ORDER — OXYCODONE HCL 5 MG PO TABS
5.0000 mg | ORAL_TABLET | ORAL | Status: DC | PRN
  Administered 2019-11-21 – 2019-11-23 (×13): 10 mg via ORAL
  Filled 2019-11-20 (×13): qty 2

## 2019-11-20 MED ORDER — ONDANSETRON HCL 4 MG/2ML IJ SOLN
INTRAMUSCULAR | Status: DC | PRN
  Administered 2019-11-20: 4 mg via INTRAVENOUS

## 2019-11-20 MED ORDER — BUPIVACAINE HCL 0.5 % IJ SOLN
INTRAMUSCULAR | Status: DC | PRN
  Administered 2019-11-20: 30 mL

## 2019-11-20 MED ORDER — SIMETHICONE 80 MG PO CHEW
80.0000 mg | CHEWABLE_TABLET | ORAL | Status: DC
  Administered 2019-11-21 – 2019-11-23 (×2): 80 mg via ORAL
  Filled 2019-11-20 (×2): qty 1

## 2019-11-20 MED ORDER — KETOROLAC TROMETHAMINE 30 MG/ML IJ SOLN: 30.0000 mg | Freq: Four times a day (QID) | INTRAMUSCULAR | Status: DC | PRN

## 2019-11-20 MED ORDER — DIBUCAINE (PERIANAL) 1 % EX OINT: 1.0000 "application " | TOPICAL_OINTMENT | CUTANEOUS | Status: DC | PRN

## 2019-11-20 MED ORDER — SENNOSIDES-DOCUSATE SODIUM 8.6-50 MG PO TABS
2.0000 | ORAL_TABLET | ORAL | Status: DC
  Administered 2019-11-20 – 2019-11-23 (×3): 2 via ORAL
  Filled 2019-11-20 (×3): qty 2

## 2019-11-20 MED ORDER — OXYTOCIN 40 UNITS IN NORMAL SALINE INFUSION - SIMPLE MED
INTRAVENOUS | Status: AC
  Filled 2019-11-20: qty 1000

## 2019-11-20 MED ORDER — DIPHENHYDRAMINE HCL 50 MG/ML IJ SOLN
INTRAMUSCULAR | Status: AC
  Filled 2019-11-20: qty 1

## 2019-11-20 MED ORDER — MORPHINE SULFATE (PF) 0.5 MG/ML IJ SOLN
INTRAMUSCULAR | Status: AC
  Filled 2019-11-20: qty 10

## 2019-11-20 MED ORDER — FENTANYL CITRATE (PF) 100 MCG/2ML IJ SOLN
INTRAMUSCULAR | Status: DC | PRN
  Administered 2019-11-20: 15 ug via INTRATHECAL

## 2019-11-20 MED ORDER — SODIUM CHLORIDE 0.9 % IR SOLN
Status: DC | PRN
  Administered 2019-11-20: 1 via INTRAVESICAL

## 2019-11-20 SURGICAL SUPPLY — 41 items
BENZOIN TINCTURE PRP APPL 2/3 (GAUZE/BANDAGES/DRESSINGS) ×3 IMPLANT
CHLORAPREP W/TINT 26ML (MISCELLANEOUS) ×3 IMPLANT
CLAMP CORD UMBIL (MISCELLANEOUS) IMPLANT
CLOSURE WOUND 1/2 X4 (GAUZE/BANDAGES/DRESSINGS) ×1
CLOTH BEACON ORANGE TIMEOUT ST (SAFETY) ×3 IMPLANT
DERMABOND ADVANCED (GAUZE/BANDAGES/DRESSINGS) ×2
DERMABOND ADVANCED .7 DNX12 (GAUZE/BANDAGES/DRESSINGS) IMPLANT
DRSG OPSITE POSTOP 4X10 (GAUZE/BANDAGES/DRESSINGS) ×3 IMPLANT
ELECT REM PT RETURN 9FT ADLT (ELECTROSURGICAL) ×3
ELECTRODE REM PT RTRN 9FT ADLT (ELECTROSURGICAL) ×1 IMPLANT
EXTRACTOR VACUUM KIWI (MISCELLANEOUS) IMPLANT
GAUZE SPONGE 4X4 12PLY STRL LF (GAUZE/BANDAGES/DRESSINGS) ×4 IMPLANT
GLOVE BIO SURGEON STRL SZ 6.5 (GLOVE) ×2 IMPLANT
GLOVE BIO SURGEONS STRL SZ 6.5 (GLOVE) ×1
GLOVE BIOGEL PI IND STRL 7.0 (GLOVE) ×2 IMPLANT
GLOVE BIOGEL PI INDICATOR 7.0 (GLOVE) ×4
GOWN STRL REUS W/TWL LRG LVL3 (GOWN DISPOSABLE) ×9 IMPLANT
KIT ABG SYR 3ML LUER SLIP (SYRINGE) IMPLANT
NDL HYPO 25X5/8 SAFETYGLIDE (NEEDLE) IMPLANT
NEEDLE HYPO 22GX1.5 SAFETY (NEEDLE) IMPLANT
NEEDLE HYPO 25X5/8 SAFETYGLIDE (NEEDLE) IMPLANT
NS IRRIG 1000ML POUR BTL (IV SOLUTION) ×3 IMPLANT
PACK C SECTION WH (CUSTOM PROCEDURE TRAY) ×3 IMPLANT
PAD ABD 7.5X8 STRL (GAUZE/BANDAGES/DRESSINGS) ×2 IMPLANT
PAD OB MATERNITY 4.3X12.25 (PERSONAL CARE ITEMS) ×3 IMPLANT
PENCIL SMOKE EVAC W/HOLSTER (ELECTROSURGICAL) ×3 IMPLANT
RTRCTR C-SECT PINK 25CM LRG (MISCELLANEOUS) ×3 IMPLANT
STRIP CLOSURE SKIN 1/2X4 (GAUZE/BANDAGES/DRESSINGS) ×2 IMPLANT
SUT CHROMIC 2 0 CT 1 (SUTURE) ×8 IMPLANT
SUT MNCRL 0 VIOLET CTX 36 (SUTURE) ×2 IMPLANT
SUT MONOCRYL 0 CTX 36 (SUTURE) ×6
SUT PDS AB 0 CTX 60 (SUTURE) ×3 IMPLANT
SUT PLAIN 2 0 (SUTURE) ×4
SUT PLAIN ABS 2-0 CT1 27XMFL (SUTURE) IMPLANT
SUT VIC AB 0 CTX 36 (SUTURE) ×4
SUT VIC AB 0 CTX36XBRD ANBCTRL (SUTURE) ×2 IMPLANT
SUT VIC AB 4-0 KS 27 (SUTURE) ×3 IMPLANT
SYR CONTROL 10ML LL (SYRINGE) IMPLANT
TOWEL OR 17X24 6PK STRL BLUE (TOWEL DISPOSABLE) ×3 IMPLANT
TRAY FOLEY W/BAG SLVR 14FR LF (SET/KITS/TRAYS/PACK) ×3 IMPLANT
WATER STERILE IRR 1000ML POUR (IV SOLUTION) ×3 IMPLANT

## 2019-11-20 NOTE — Anesthesia Postprocedure Evaluation (Signed)
Anesthesia Post Note  Patient: Kathryn Perry  Procedure(s) Performed: CESAREAN SECTION (N/A )     Patient location during evaluation: PACU Anesthesia Type: Spinal Level of consciousness: awake and alert and oriented Pain management: pain level controlled Vital Signs Assessment: post-procedure vital signs reviewed and stable Respiratory status: spontaneous breathing, nonlabored ventilation and respiratory function stable Cardiovascular status: blood pressure returned to baseline Postop Assessment: no apparent nausea or vomiting, spinal receding, no headache and no backache Anesthetic complications: no    Last Vitals:  Vitals:   11/20/19 1600 11/20/19 1615  BP: 112/70 119/77  Pulse: 65 72  Resp:  17  Temp:  36.5 C  SpO2: 100% 99%    Last Pain:  Vitals:   11/20/19 1623  TempSrc:   PainSc: 2    Pain Goal: Patients Stated Pain Goal: 2 (11/20/19 1200)  LLE Motor Response: Purposeful movement (11/20/19 1615) LLE Sensation: Tingling (11/20/19 1615) RLE Motor Response: Purposeful movement (11/20/19 1615) RLE Sensation: Tingling (11/20/19 1615)     Epidural/Spinal Function Cutaneous sensation: Tingles (11/20/19 1615), Patient able to flex knees: Yes (11/20/19 1615), Patient able to lift hips off bed: No (11/20/19 1615), Back pain beyond tenderness at insertion site: No (11/20/19 1615), Progressively worsening motor and/or sensory loss: No (11/20/19 1615), Bowel and/or bladder incontinence post epidural: No (11/20/19 1615)  Brennan Bailey

## 2019-11-20 NOTE — Anesthesia Procedure Notes (Signed)
Spinal  Patient location during procedure: OR Start time: 11/20/2019 1:57 PM End time: 11/20/2019 2:00 PM Staffing Performed: anesthesiologist  Anesthesiologist: Brennan Bailey, MD Preanesthetic Checklist Completed: patient identified, IV checked, risks and benefits discussed, monitors and equipment checked, pre-op evaluation and timeout performed Spinal Block Patient position: sitting Prep: DuraPrep and site prepped and draped Patient monitoring: heart rate, continuous pulse ox and blood pressure Approach: midline Location: L3-4 Injection technique: single-shot Needle Needle type: Pencan  Needle gauge: 24 G Needle length: 10 cm Assessment Sensory level: T4 Additional Notes Risks, benefits, and alternative discussed. Patient gave consent to procedure. Prepped and draped in sitting position. Clear CSF obtained after one needle pass. Positive terminal aspiration. No pain or paraesthesias with injection. Patient tolerated procedure well. Vital signs stable. Tawny Asal, MD

## 2019-11-20 NOTE — Transfer of Care (Signed)
Immediate Anesthesia Transfer of Care Note  Patient: Kathryn Perry  Procedure(s) Performed: CESAREAN SECTION (N/A )  Patient Location: PACU  Anesthesia Type:Regional  Level of Consciousness: awake  Airway & Oxygen Therapy: Patient Spontanous Breathing  Post-op Assessment: Report given to RN  Post vital signs: Reviewed and stable  Last Vitals:  Vitals Value Taken Time  BP 104/66 11/20/19 1530  Temp    Pulse 71 11/20/19 1531  Resp 15 11/20/19 1531  SpO2 99 % 11/20/19 1531  Vitals shown include unvalidated device data.  Last Pain:  Vitals:   11/20/19 1500  TempSrc: Oral  PainSc:       Patients Stated Pain Goal: 2 ([id number])  Complications: No apparent anesthesia complications

## 2019-11-20 NOTE — Progress Notes (Signed)
MD preoperative Note  Name: Kathryn Perry Medical Record Number:  [id number] Date of Birth: [birth year] Date of Service: 11/20/2019  34 y.o. G3P1011 [redacted]w[redacted]d HD#32 di/di twins admitted for Vaginal bleeding   Discussed case with Dr. 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    Patient Active Problem List   Diagnosis Date Noted  . Twin pregnancy in third trimester / DiDi 10/28/2019  . Vaginal bleeding, DiDi twins 10/18/2019  . Status post cesarean section 10/07/2017  . Right ovarian cyst 06/26/2015  . Endometriosis determined by laparoscopy 06/26/2015    Physical Examination:   Vitals:   11/20/19 1205 11/20/19 1210  BP:    Pulse:    Resp:    Temp:    SpO2: 98% 98%   General appearance - alert, well appearing, and in no distress and oriented to person, place, and time Mental status - alert, oriented to person, place, and time, normal mood, behavior, speech, dress, motor activity, and thought processes  Abd  Soft, gravid, nontender Ex SCDs  Cervix: not evaluated  Pad evaluated and there was bright red blood  Results for orders placed or performed during the hospital encounter of 10/18/19 (from the past 24 hour(s))  Type and screen [place]     Status: None (Preliminary result)   Collection Time: 11/20/19  7:29 AM  Result Value Ref Range   ABO/RH(D) O POS    Antibody Screen NEG    Sample Expiration      11/23/2019,2359 Performed at [facility], [address] [place], [place] [address]    Unit Number [id number]    Blood Component Type RED CELLS,LR    Unit division 00    Status of Unit ALLOCATED    Transfusion Status OK TO TRANSFUSE    Crossmatch Result Compatible    Unit Number [id number]    Blood Component Type RED CELLS,LR    Unit division 00    Status of Unit ALLOCATED    Transfusion Status OK TO TRANSFUSE    Crossmatch  Result Compatible   CBC with Differential/Platelet     Status: Abnormal   Collection Time: 11/20/19  9:25 AM  Result Value Ref Range   WBC 5.1 4.0 - 10.5 K/uL   RBC 3.28 (L) 3.87 - 5.11 MIL/uL   Hemoglobin 9.4 (L) 12.0 - 15.0 g/dL   HCT 29.9 (L) 36.0 - 46.0 %   MCV 91.2 80.0 - 100.0 fL   MCH 28.7 26.0 - 34.0 pg   MCHC 31.4 30.0 - 36.0 g/dL   RDW 16.3 (H) 11.5 - 15.5 %   Platelets 167 150 - 400 K/uL   nRBC 0.4 (H) 0.0 - 0.2 %   Neutrophils Relative % 49 %   Neutro Abs 2.5 1.7 - 7.7 K/uL   Lymphocytes Relative 37 %   Lymphs Abs 1.9 0.7 - 4.0 K/uL   Monocytes Relative 11 %   Monocytes Absolute 0.6 0.1 - 1.0 K/uL   Eosinophils Relative 1 %   Eosinophils Absolute 0.0 0.0 - 0.5 K/uL   Basophils Relative 0 %   Basophils Absolute 0.0 0.0 - 0.1 K/uL   Immature Granulocytes 2 %   Abs Immature Granulocytes 0.08 (H) 0.00 - 0.07 K/uL  Amnisure rupture of membrane (rom)not at [facility]     Status: None   Collection Time: 11/20/19 10:32 AM  Result Value Ref Range  Amnisure ROM NEGATIVE   Prepare RBC (crossmatch)     Status: None   Collection Time: 11/20/19 12:46 PM  Result Value Ref Range   Order Confirmation      ORDER PROCESSED BY BLOOD BANK Performed at [facility], 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  Plan: 1. Proceed with a repeat cesarean section.  Discussed risks of surgery to include hemorrhage requiring a blood transfusion, infection, injury to internal organs, vessels etc.  2. Ancef on call to OR 3. Two units of Packed red blood cells on call to OR if needed  [employer]

## 2019-11-20 NOTE — Progress Notes (Addendum)
Hospital day #32 Kathryn Perry a 34 y.o.female, G3P1011, spont IUP of di/di twins at74w2d,admittedfor vaginal bleeding and preterm contractions @ [redacted]w[redacted]d. S/P magnesiumx 2 courses and rescue dose of BMZ 4/9. Procardia for preterm ctx was dc'd 11/17/19.  S: Called by RN for change in vaginal bleeding. Pt reports "brighter red" bleeding when wiping. Denies abd pain, endorses mild cramping. Endorses active FM.   O: Blood pressure 104/68, pulse 85, temperature 98.3 F (36.8 C), temperature source Oral, resp. rate 18, height 5\' 1"  (1.549 m), weight 82.6 kg, last menstrual period 03/25/2019, SpO2 98 % NST Twin A 140 baseline/moderate variability/15x15 present/decels absent Twin B 130 baseline/moderate variability/15x15 present/decels absent Ctx 3-5 min/mild-mod per palpation/soft resting tone  Gen: AO, x3, no distress Abd: Soft GU: Red vaginal bleeding, approx 2-3 cm in diameter Ext: No edema, pain, tenderness, or cords  A/P Hosp Day 32 34 y.o.G3P1011 [redacted]w[redacted]d  Vaginal bleeding: red bleeding when wiping this AM. Dr. Alwyn Pea aware. Bedrest w/ bedpan starting now. Plan for rpt C/S if bleeding persist  Preterm Ctx: 3-5 min, mild to mod per palpation  Fetal well-being: Cat 1 x2, active FM palpable. Twice weekly BPP w/ Dopplers. U/S 4/30 A; Breech, BPP 10/10; B is breech, BPP 8/10, -2 for breathing. Twin B IUGR. U/S for 5/3 to be performed @ bedside, results pending.   GBS: neg  Anemia: Hgb 9.2 on 4/12, Oral iron and MagOx, hgb 8.7 on 4/24, hemaferrin given on 4/23 Pt stable, asymptomatic, CBC today  Chest pain: resolved and no complains since 4/23, complete work-up negative  Burman Foster, MSN, CNM 11/20/2019 9:26 AM

## 2019-11-20 NOTE — Lactation Note (Signed)
This note was copied from a baby's chart. Lactation Consultation Note  Patient Name: Kathryn Perry [id number] Date: 11/20/2019 Reason for consult: Initial assessment;NICU baby;Multiple gestation;Infant < 6lbs;Late-preterm 34-36.6wks  LC and [employer] student completed an initial assessment with Ms. Zuniga at 34 yo, P3 who gave birth via C-section to female twins at [redacted]w[redacted]d. Babies "[name]" and "[name]" are currently in NICU and weigh 3lb 14 oz and 4lb 10oz respectively. Ms. Demello reported that she has been in [facility] specialty care since May 1st due to vaginal bleeding before giving birth approximately 7 hours ago. Ms. Cantalupo shared that she breastfed her oldest (now 36years old) exclusively for 9 months.   Ms. Brent was pumping upon [facility] student entrance. Upon assessment, LC observed that the 55mm flange size looked too small for the right nipple and offered to help her try the 41mm flange -- MOB was agreeable. MOB reported that the 8mm flange felt more comfortable and asked [employer] student to switch out the right flange with the larger size as well. Drops of colostrum were noted on the right flange following the pumping session.  LC praised MOB for already beginning to pump. LC encouraged MOB to continue to pump 8-12x in 24 hours and discussed milk storage guidelines.   LC reviewed 'Providing Breastmilk for Your Baby In the NICU' brochure and outpatient resources/services brochure. Ms. Zehm reported that she has no further questions or concerns at this time.   Maternal Data Has patient been taught Hand Expression?: Yes Does the patient have breastfeeding experience prior to this delivery?: Yes  Feeding Feeding Type: Donor Breast Milk  LATCH Score                   Interventions    Lactation Tools Discussed/Used Tools: Pump;Flanges Flange Size: 27;24 Breast pump type: Double-Electric Breast Pump WIC Program: No   Consult Status Consult Status: Follow-up Date: 11/21/19 Follow-up type:  In-patient    Onalee Hua 11/20/2019, 10:22 PM

## 2019-11-20 NOTE — Progress Notes (Signed)
Notified NICU charge nurse, OB Anesthesiologist, OB OR charge nurse, and house coverage of plan for c-section.

## 2019-11-20 NOTE — Progress Notes (Signed)
Bedside ultrasound complete. Pt put back on EFM. No vaginal bleeding noted at this time. Toya Smothers, RN

## 2019-11-20 NOTE — Anesthesia Preprocedure Evaluation (Addendum)
Anesthesia Evaluation  Patient identified by MRN, date of birth, ID band Patient awake    Reviewed: Allergy & Precautions, NPO status , Patient's Chart, lab work & pertinent test results  History of Anesthesia Complications Negative for: history of anesthetic complications  Airway Mallampati: II  TM Distance: >3 FB Neck ROM: Full    Dental no notable dental hx.    Pulmonary neg pulmonary ROS,    Pulmonary exam normal        Cardiovascular negative cardio ROS Normal cardiovascular exam     Neuro/Psych Anxiety negative neurological ROS     GI/Hepatic negative GI ROS, Neg liver ROS,   Endo/Other  negative endocrine ROS  Renal/GU negative Renal ROS  negative genitourinary   Musculoskeletal negative musculoskeletal ROS (+)   Abdominal   Peds  Hematology  (+) anemia , Hgb 9.4, Plt 167   Anesthesia Other Findings Day of surgery medications reviewed with patient.  Reproductive/Obstetrics (+) Pregnancy (twin gestation, vaginal bleeding)                            Anesthesia Physical Anesthesia Plan  ASA: II  Anesthesia Plan: Spinal   Post-op Pain Management:    Induction:   PONV Risk Score and Plan: 4 or greater and Treatment may vary due to age or medical condition, Ondansetron and Dexamethasone  Airway Management Planned: Natural Airway  Additional Equipment: None  Intra-op Plan:   Post-operative Plan:   Informed Consent: I have reviewed the patients History and Physical, chart, labs and discussed the procedure including the risks, benefits and alternatives for the proposed anesthesia with the patient or authorized representative who has indicated his/her understanding and acceptance.       Plan Discussed with: CRNA  Anesthesia Plan Comments:        Anesthesia Quick Evaluation

## 2019-11-20 NOTE — Op Note (Addendum)
Kathryn Section Procedure Note  Indications: Di/Di Twin pregnancy at 42 weeks 2 twins with vaginal bleeding concern for abruption, patient with previous uterine incision kerr x 1  Pre-operative Diagnosis: 34 week 2 day Sharma Covert / Sharma Covert twin pregnancy, prior Low transverse Kathryn             Section with vaginal bleeding  Post-operative Diagnosis: same  Surgeon: Caffie Damme   Assistants: same as above  Anesthesia: Spinal anesthesia  ASA Class: 2  Procedure Details   The patient was counseled about the risks, benefits, complications of the Kathryn section. The patient concurred with the proposed plan, giving informed consent.  The site of surgery properly noted/marked. The patient was taken to Operating Room # B, identified as [name] and the procedure verified as C-Section Delivery. A Time Out was held and the above information confirmed.  After spinal was found to adequate , the patient was placed in the dorsal supine position with a leftward tilt, draped and prepped in the usual sterile manner. A Pfannenstiel incision was made and carried down through the subcutaneous tissue to the fascia.  The fascia was incised in the midline and the fascial incision was extended laterally with Mayo scissors. The superior aspect of the fascial incision was grasped with Coker clamps x2, tented up and the rectus muscles dissected off sharply with the scalpel. The rectus was then dissected off with blunt dissection and Mayo scissors inferiorly. The rectus muscles were separated in the midline. The abdominal peritoneum was identified, tented up between two hemostats, entered sharply using Metzenbaum scissors, and the incision was extended superiorly and inferiorly with good visualization of the bladder. The Alexis retractor was then deployed. The vesicouterine peritoneum was identified, tented up, entered sharply with Metzenbaum scissors, and the bladder flap was created digitally. Scalpel was then used to  make a low transverse incision on the uterus which was extended laterally with  blunt dissection.   There was occult blood in the uterus consistent with abruption.  The fetal vertex of A was identified, the amniotic sac ruptured with an Allis clamp and the head was brought out from the pelvis. he head was then delivered easily through the uterine incision followed by the body. The A live female infant was bulb suctioned on the operative field cried vigorously, cord was clamped and cut and the infant was passed to the waiting neonatologist. Apgars 8/9.  Baby B was footling breech.  The feet were grasped and the body brought out to the hips.  A warm moist towel was placed over the hips and back and the arms were delivered via Pinnard maneuver.  The head was then delivered via Marga Hoots, Veit maneuver.  Cord clamped and cut and the infant handed to waiting Neonatologist. Apgars @ 8/9.  Both  Placentae were then delivered spontaneously, intact and handed off the field to be sent to Pathology. The uterus was cleared of all clot and debris. The uterine incision was repaired with #0 Monocryl in running locked fashion. A second imbricating suture was performed using the same suture. The incision was hemostatic. Ovaries and tubes were inspected and normal. The Alexis retractor was removed. The abdominal cavity was cleared of all clot and debris. The abdominal peritoneum was reapproximated with 2-0 chromic  in a running fashion, the rectus muscles was reapproximated with #2 chromic and #2 plain gut in interrupted fashion. The fascia was closed with 0 looped PDS in a running fashion. The subcuticular layer was irrigated and  all bleeders cauterized.    The incision was injected with 30 mL of 0.5% Marcaine. Interrupted sutures of 2-0 plain were used to re-approximate Scarpas fascia.  The skin was closed with 4-0 vicryl in a subcuticular fashion using a Lanny Hurst needle. The incision was dressed with Dermabond, honeycomb  dressing, and pressure dressing. All sponge, lap and needle counts were correct x3. Patient tolerated the procedure well and recovered in stable condition following the procedure.  Instrument, sponge, and needle counts were correct prior the abdominal closure and at the conclusion of the case.   Findings: Two Live female infants ([name]: Kathryn Perry Baby B Kathryn Perry, Apgars 8/9, bloody amniotic fluid of twin B, possible clot behind twin B, normal appearing placenta of A. 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  Both babies taken to NICU  Estimated Blood Loss: 998 mL  IVF: 1900 mL         Drains: Foley catheter  Urine output: 175 mL clear urine         Specimens: Placenta to Pathology         Implants: Perry         Complications:  Perry; patient tolerated the procedure well.         Disposition: PACU - hemodynamically stable.   Rogelio Seen Miachel Nardelli

## 2019-11-21 ENCOUNTER — Encounter: Payer: Self-pay | Admitting: *Deleted

## 2019-11-21 LAB — CBC
HCT: 24.7 % — ABNORMAL LOW (ref 36.0–46.0)
Hemoglobin: 8.1 g/dL — ABNORMAL LOW (ref 12.0–15.0)
MCH: 28.9 pg (ref 26.0–34.0)
MCHC: 32.8 g/dL (ref 30.0–36.0)
MCV: 88.2 fL (ref 80.0–100.0)
Platelets: 158 10*3/uL (ref 150–400)
RBC: 2.8 MIL/uL — ABNORMAL LOW (ref 3.87–5.11)
RDW: 15.8 % — ABNORMAL HIGH (ref 11.5–15.5)
WBC: 11.9 10*3/uL — ABNORMAL HIGH (ref 4.0–10.5)
nRBC: 0 % (ref 0.0–0.2)

## 2019-11-21 NOTE — Progress Notes (Signed)
Subjective: Postpartum Day 1: Cesarean Delivery Patient reports tolerating PO, + flatus and no problems voiding.   Twin Girls doing well in NICU  Objective: Vital signs in last 24 hours: Temp:  [97.7 F (36.5 C)-98.9 F (37.2 C)] 98.3 F (36.8 C) (05/04 0300) Pulse Rate:  [57-97] 70 (05/04 0300) Resp:  [11-25] 18 (05/04 0300) BP: (104-136)/(66-88) 113/73 (05/04 0300) SpO2:  [94 %-100 %] 100 % (05/04 0300)  Physical Exam:  General: alert, cooperative, appears stated age and no distress Lochia: appropriate Uterine Fundus: firm Incision: Bandage CDI DVT Evaluation: No evidence of DVT seen on physical exam. Negative Homan's sign. No cords or calf tenderness. No significant calf/ankle edema.  Recent Labs    11/20/19 2001 11/21/19 0647  HGB 9.4* 8.1*  HCT 29.5* 24.7*    Assessment/Plan: Status post Cesarean section. Doing well postoperatively.  Continue current care Advance diet and activity as tolerated  Anderson Malta B Valleri Hendricksen 11/21/2019, 7:53 AM

## 2019-11-21 NOTE — Progress Notes (Signed)
Patient screened out for psychosocial assessment since none of the following apply: °Psychosocial stressors documented in mother or baby's chart °Gestation less than 32 weeks °Code at delivery  °Infant with anomalies °Please contact the Clinical Social Worker if specific needs arise, by MOB's request, or if MOB scores greater than 9/yes to question 10 on Edinburgh Postpartum Depression Screen. ° °Takyra Cantrall Boyd-Gilyard, MSW, LCSW °Clinical Social Work °[phone] °  °

## 2019-11-21 NOTE — Plan of Care (Signed)
  Problem: Activity: Goal: Ability to tolerate increased activity will improve Outcome: Progressing   Problem: Coping: Goal: Ability to identify and utilize available resources and services will improve Outcome: Completed/Met

## 2019-11-21 NOTE — Lactation Note (Signed)
This note was copied from a baby's chart. Lactation Consultation Note  Patient Name: Kathileen Berninger Today's Date: 11/21/2019     [employer] student entered room to Mom sitting at the side of her bed. Mom Euretta is pumping on a 3 hourly schedule, and has done so since her first pumping session at 20:47 last night. Lenis is not getting any expressed milk at this point, but managed to hand express 2 drops of colostrum after her last pumping session. Phylis asked for a manual pump, as she had better results with a manual pump soon after her first baby was born. [facility] student retrieved a manual pump, and a size 27 flange for [employer].   Annika asked [facility] student to confirm if milk can take longer to come in after a c-section, and [facility] student did confirm that that can happen, but encouraged Adair that she is doing all the right things to get her milk supply going strong. Azayla has no further questions at this point, and will call out if she needs LC assistance.    Susette Racer Avree Szczygiel 11/21/2019, 12:06 PM

## 2019-11-22 LAB — SURGICAL PATHOLOGY

## 2019-11-22 NOTE — Lactation Note (Signed)
This note was copied from a baby's chart. Lactation Consultation Note  Patient Name: Kathryn Perry [id number] Date: 11/22/2019 Reason for consult: Follow-up assessment  P3 mother whose infant twins are now 54 hours old.  The babies were born at 23+2 weeks with a CGA of 34+4 weeks, weighing <5 lbs and in the NICU.  Mother breast fed her first child (now 36 years old) for 9 months.  Mother has been pumping approximately every three hours and is obtaining between 5-10 mls/session.  She has been collecting her milk in colostrum containers and has labels for transport to the NICU.  Discussed continuing to practice hand expression before/after pumping to help with milk supply.  Since it was time to pump I asked permission to observe mother pumping.  She was agreeable and stated that she believes her current flange size is too small.  Observed mother pumping and agreed with her observation.  #30 flange size provided for greater fit and comfort.  Mother felt strong uterine contractions during pumping and I encouraged her to take pain medication as needed.  Mother will take any EBM she obtains to the NICU when she visits this afternoon.    Suggested mother pump at baby's bedside as desired.  Showed her how she can transport her pump pieces to the NICU.  Mother may consider this option.    She has a DEBP for home use and is also planning to obtain a new Medela  pump via her insurance company from this pregnancy.  No support person present at this time.  Encouraged mother to call for any further questions/concerns.   Maternal Data    Feeding Feeding Type: Donor Breast Milk  LATCH Score                   Interventions    Lactation Tools Discussed/Used     Consult Status Consult Status: Follow-up Date: 11/23/19 Follow-up type: In-patient    Kathryn Perry R Kathryn Perry 11/22/2019, 11:22 AM

## 2019-11-22 NOTE — Progress Notes (Signed)
Kathryn Perry [id number] Postpartum Postoperative Day #2 Kathryn Perry, [id number], [redacted]w[redacted]d, S/P repeat LT Cesarean Section due to placental abruption at 34.2 weeks with di/di twins.   Subjective: Patient up ad lib, denies syncope or dizziness. Reports consuming regular diet without issues and denies N/V. Patient reports no bowel movements but is passing flatus.  Denies issues with urination and reports bleeding "seems normal."  Patient is pumping breastmilk and reports going well. Pain is being appropriately managed with use of Oxy IR. Twins are doing well in NICU and pt is visiting regularly.   Objective: Patient Vitals for the past 24 hrs:  BP Temp Temp src Pulse Resp SpO2  11/22/19 0802 123/75 99 F (37.2 C) Oral 86 20 98 %  11/21/19 2318 112/72 -- -- 81 -- --  11/21/19 2316 112/72 98 F (36.7 C) Oral 80 20 95 %  11/21/19 1948 118/72 -- -- 97 -- --  11/21/19 1947 118/72 98.7 F (37.1 C) Oral 92 19 97 %  11/21/19 1539 109/79 98.4 F (36.9 C) Oral 81 18 99 %  11/21/19 1247 112/68 98.8 F (37.1 C) Oral 97 18 --    Physical Exam:  General: alert and cooperative Mood/Affect: appropriate Lungs: clear to auscultation, no wheezes, rales or rhonchi, symmetric air entry.  Heart: normal rate, regular rhythm, normal S1, S2, no murmurs, rubs, clicks or gallops. Breast: breasts appear normal, no suspicious masses, no skin or nipple changes or axillary nodes. Abdomen:  + bowel sounds, soft, non-tender Incision: healing well, Honeycomb dressing  Uterine Fundus: firm, involution U-1 Lochia: appropriate Skin: Warm, Dry. DVT Evaluation: No evidence of DVT seen on physical exam.  Labs: Recent Labs    11/20/19 0925 11/20/19 2001 11/21/19 0647  HGB 9.4* 9.4* 8.1*  HCT 29.9* 29.5* 24.7*  WBC 5.1 9.5 11.9*   I/O: I/O last 3 completed shifts: In: -  Out: 1550 [Urine:1550]   Assessment Postpartum Postoperative Day #2. Kathryn Perry, [id number], [redacted]w[redacted]d, S/P repeat LT Cesarean Section due to placental  abruption.  Pt stable. U-1 Involution. Pumping breast milk. Hemodynamically Stable.  Plan: Continue other mgmt as ordered VTE Prophylactics: SCD, ambulated as tolerates.  Pain control: Motrin/Tylenol/Narcotics PRN Anticipate discharge home tomorrow.   Dr. Alwyn Pea to be updated on patient status  [name] , MSN 11/22/2019, 12:28 PM

## 2019-11-23 ENCOUNTER — Inpatient Hospital Stay (HOSPITAL_COMMUNITY): Payer: BC Managed Care – PPO

## 2019-11-23 ENCOUNTER — Ambulatory Visit (HOSPITAL_COMMUNITY): Payer: BC Managed Care – PPO

## 2019-11-23 LAB — TYPE AND SCREEN
ABO/RH(D): O POS
ABO/RH(D): O POS
Antibody Screen: NEGATIVE
Antibody Screen: NEGATIVE
Unit division: 0
Unit division: 0

## 2019-11-23 LAB — BPAM RBC
Blood Product Expiration Date: 202106032359
Blood Product Expiration Date: 202106032359
Unit Type and Rh: 5100
Unit Type and Rh: 5100

## 2019-11-23 MED ORDER — ACETAMINOPHEN 500 MG PO TABS: 1000.0000 mg | ORAL_TABLET | Freq: Four times a day (QID) | ORAL | 0 refills | Status: DC | PRN

## 2019-11-23 MED ORDER — OXYCODONE HCL 5 MG PO TABS: 5.0000 mg | ORAL_TABLET | Freq: Four times a day (QID) | ORAL | 0 refills | Status: AC | PRN

## 2019-11-23 NOTE — Discharge Summary (Signed)
Postpartum Discharge Summary     Patient Name: Kathryn Perry DOB: 1986/06/14 MRN: [id number]  Date of admission: 10/18/2019 Delivering Provider:    Cumi, Sanagustin [[id number]]  Caroleen, Stoermer Eunola [[id number]]  Sanjuana Kava    Date of discharge: 11/23/2019  Admitting diagnosis: Vaginal bleeding [N93.9] Intrauterine pregnancy: [redacted]w[redacted]d    Secondary diagnosis:  Active Problems:   Postpartum care following cesarean delivery  Additional problems:  Patient Active Problem List   Diagnosis Date Noted  . Postpartum care following cesarean delivery 11/20/2019  . Status post cesarean section 10/07/2017  . Right ovarian cyst 06/26/2015  . Endometriosis determined by laparoscopy 06/26/2015        Discharge diagnosis: Preterm Pregnancy Delivered                                                                                                Post partum procedures:none  Augmentation: none  Complications: Placental Abruption  Hospital course:  Antepartum bleeding With Unplanned C/S  34y.o. yo [id number] admitted for vaginal bleeding in the third trimester w/ spont Di/Di twins on 10/18/2019. Vaginal bleeding changed from dark [place] to bright red w/ increasing uterine activity. Pt w/ hx of prev C/S.  Membrane Rupture Time/Date:    BrJameyah, Fennewald0[[id number]]2:27 PM    BrRowan Blased[place]ison[place]0[[id number]]2:27 PM  ,   BrLorrene, GraefdSatanta0[[id number]]11/20/2019    BrShaliyah, Taite0[[id number]]11/20/2019    The patient went for cesarean section due to Elective Repeat, Prior Uterine Surgery and Multifetal Gestation, and delivered a Viable infant,   BrValeen, Borys0[[id number]]11/20/2019    BrJennife, Zaucha0[[id number]]11/20/2019   Details of operation can be found in separate operative note. Patient had an uncomplicated postpartum course.  She is ambulating,tolerating a regular diet, passing flatus, and urinating well.  Patient is discharged home in stable  condition 11/23/19. Delivery time:    BrCorah, Willeford0[[id number]]2:28 PM    [facility][[id number]]2:30 PM    Magnesium Sulfate received: Yes BMZ received: Yes Rhophylac:N/A MMR:N/A Transfusion:No  Physical exam  Vitals:   11/22/19 1925 11/22/19 2357 11/23/19 0559 11/23/19 0754  BP: 130/83 116/73 117/72 123/78  Pulse: 95 82 86 97  Resp: 20 20 19 18   Temp: 98.6 F (37 C) 98.5 F (36.9 C) 99 F (37.2 C) 98.8 F (37.1 C)  TempSrc: Oral Oral Oral Oral  SpO2: 98% 98% 99% 96%  Weight:      Height:       General: alert, cooperative and no distress Lochia: appropriate Uterine Fundus: firm Incision: honeycomb dressing is clean, dry, and intact DVT Evaluation: No evidence of DVT seen on physical exam. No cords or calf tenderness. No significant calf/ankle edema. Labs: Lab Results  Component Value Date   WBC 11.9 (H) 11/21/2019   HGB 8.1 (L) 11/21/2019   HCT 24.7 (L) 11/21/2019   MCV 88.2 11/21/2019   PLT 158 11/21/2019   CMP Latest Ref Rng &  Units 11/12/2019  Glucose mg/dL 85  BUN 6 - 20 mg/dL -  Creatinine 0.44 - 1.00 mg/dL -  Sodium 135 - 145 mmol/L -  Potassium 3.5 - 5.1 mmol/L -  Chloride 98 - 111 mmol/L -  CO2 22 - 32 mmol/L -  Calcium 8.9 - 10.3 mg/dL -  Total Protein 6.5 - 8.1 g/dL -  Total Bilirubin 0.3 - 1.2 mg/dL -  Alkaline Phos 38 - 126 U/L -  AST 15 - 41 U/L -  ALT 0 - 44 U/L -   Edinburgh Score: Edinburgh Postnatal Depression Scale Screening Tool 11/21/2019  I have been able to laugh and see the funny side of things. 0  I have looked forward with enjoyment to things. 0  I have blamed myself unnecessarily when things went wrong. 1  I have been anxious or worried for no good reason. 0  I have felt scared or panicky for no good reason. 0  Things have been getting on top of me. 0  I have been so unhappy that I have had difficulty sleeping. 0  I have felt sad or miserable. 1  I have been so unhappy that I have been crying. 1  The  thought of harming myself has occurred to me. 0  Edinburgh Postnatal Depression Scale Total 3    Discharge instruction: per After Visit Summary   After visit meds:  Allergies as of 11/23/2019      Reactions   Nsaids Hives   Aleve (naproxen), Ibuprofen, ASA   Asa [aspirin] Hives      Medication List    STOP taking these medications   cetirizine 10 MG tablet Commonly known as: ZYRTEC   ferrous sulfate 325 (65 FE) MG tablet   NIFEdipine 10 MG capsule Commonly known as: Procardia   omeprazole 20 MG capsule Commonly known as: PRILOSEC   prenatal multivitamin Tabs tablet   sulfacetamide 10 % ophthalmic solution Commonly known as: BLEPH-10     TAKE these medications   acetaminophen 500 MG tablet Commonly known as: TYLENOL Take 2 tablets (1,000 mg total) by mouth every 6 (six) hours as needed for moderate pain (Pain at surgical site). What changed:   how much to take  reasons to take this   oxyCODONE 5 MG immediate release tablet Commonly known as: Oxy IR/ROXICODONE Take 1 tablet (5 mg total) by mouth every 6 (six) hours as needed for up to 7 days for severe pain.       Diet: routine diet  Activity: Advance as tolerated. Pelvic rest for 6 weeks.   Outpatient follow up:1 week for incision check and 6 weeks for routine postpartum appointment Follow up Appt:No future appointments. Follow up Visit: [facility]. Schedule an appointment as soon as possible for a visit in 1 week(s).   Specialty: Obstetrics and Gynecology Why: Return to the office in 1 week for an incision check and then in 6 weeks for your postpartum appointment.  Contact information: [address] [place] [id number] [phone]            Please schedule this patient for Postpartum visit in: 6 weeks with the following provider: Any provider In-Person For C/S patients schedule nurse incision check in  weeks 1 weeks: yes High risk pregnancy complicated by: spont Di/Di twins w/ bleeding in 3rd trisester Delivery mode:  CS Anticipated Birth Control:  IUD      Newborn Data:  Myleigh, Amara [[id number]]  Live born female  Birth Weight: 3 lb 14.1 oz (1760 g) APGAR: 8, 9  Newborn Delivery   Birth date/time: 11/20/2019 14:28:00 Delivery type: C-Section, Low Transverse Trial of labor: No C-section categorization: Repeat       Opel, Lejeune [[id number]]  Live born female  Birth Weight: 4 lb 10.8 oz (2120 g) APGAR: 4, 8  Newborn Delivery   Birth date/time: 11/20/2019 14:30:00 Delivery type: C-Section, Low Transverse Trial of labor: No C-section categorization: Repeat      Baby Feeding: Breast Disposition:NICU   11/23/2019 Arrie Eastern, CNM

## 2019-11-23 NOTE — Lactation Note (Signed)
This note was copied from a baby's chart. Lactation Consultation Note  Patient Name: Ajuni Pagani [id number] Date: 11/23/2019 Reason for consult: Follow-up assessment;NICU baby;Multiple gestation   Twins 59 hours old in NICU. CGA [redacted]w[redacted]d.  Mother hopes to be discharged at [facility]. Mother is excited to report she has pumped 50 ml in one session recently.  Discussed pumping frequency, milk storage, transportation and provided mother with additional bottles.  Suggest pumping q 2-2.5 during the day and q 4 hours at night for a minimum of 8 times per day.   Mother has DEBP at home and is still deciding what kind of pump she will request from her insurance company. Discussed engorgement care.  Mother states she will do some pumping at bedside. Reviewed future feeding plans.      Maternal Data    Feeding Feeding Type: Donor Breast Milk  LATCH Score                   Interventions    Lactation Tools Discussed/Used     Consult Status Consult Status: Follow-up Date: 11/24/19 Follow-up type: In-patient    Vivianne Master [facility] 11/23/2019, 10:15 AM

## 2019-11-29 ENCOUNTER — Ambulatory Visit: Payer: Self-pay

## 2019-11-29 NOTE — Lactation Note (Signed)
This note was copied from a baby's chart. Lactation Consultation Note  Patient Name: Kathryn Perry [id number] Date: 11/29/2019 Reason for consult: Follow-up assessment  LC Follow Up Visit:  P3 mother whose infant twins are now 43 days old.  The babies were born at 46+2 weeks with a CGA of 35+4 weeks weighing <5 lbs and in the NICU.  Mother breast fed her first child (now 36 years old) for 9 months.  RN called and requested lactation assistance for questions mother had regarding pumping.  Mother was pumping when I arrived.  She is concerned that her nipples are sensitive, especially when she touches them.  She also commented how they are sensitive during her "letdown."  Observed mother pumping with the #30 flange size which appears to be getting too small.  There is no nipple rubbing inside the tunnel, however, I suggested we try the #36 for greater comfort.  Mother willing to try.  #36 provided and mother feels more comfortable.  It does not seem to be pulling too much of the areola in at this time.  Encouraged mother to continue using her EBM and coconut oil for lubrication and/or her comfort gels.  Mother has been doing this.  Upon observation, there is no redness or trauma noted; no stress noted on areola.  I put in a follow up visit for tomorrow to assess comfort level after approximately 24 hours of using the larger flange size.  Mother appreciative.  Mother has not yet ordered her DEBP from the insurance company.  She was thinking about using her manual pump for a couple days because her other Spectra pump at home does not feel very comfortable.  I suggested she only use a DEBP due to needing to maintain a good milk volume.  Mother is currently obtaining 3 ounces from each pumping session.  Informed mother about renting a pump from the gift shop until her Medela pump arrives.  She seems very interested in doing this.  I provided details about the pump and returning the pump.  Father  present.    Consult Status Consult Status: Follow-up Date: 11/30/19 Follow-up type: In-patient    [name] 11/29/2019, 12:18 PM

## 2019-11-30 ENCOUNTER — Ambulatory Visit: Payer: Self-pay

## 2019-11-30 DIAGNOSIS — R0789 Other chest pain: Secondary | ICD-10-CM

## 2019-11-30 NOTE — Lactation Note (Signed)
This note was copied from a baby's chart. Lactation Consultation Note  Patient Name: Kathryn Perry [id number] Date: 11/30/2019 Reason for consult: Follow-up assessment;Late-preterm 34-36.6wks;NICU baby;1st time breastfeeding;Infant < 6lbs  P3 mother whose infant twins are now 8 days old.  The twins were born at 63+2 weeks with a CGA of 35+5 weeks weighing < 5 lbs and in the NICU.  RN requested latch assistance.  Mother had requested to breast feed baby "A".  She was awake when I arrived.  Asked permission to remove her shirt and mother agreeable.  Discussed realistic expectations for breast feeding at this gestational age.  Mother expressed a few drops of EBM which I finger fed to baby prior to latching.  Her suck was uncoordinated and jaw was tight.  She relaxed slightly as suck training performed.  Assisted to latch in the football hold on the left breast, however, baby pushed back and was not interested in sustaining a latch.  Due to mother's short shafted nipple I suggested trying with a NS.  Mother agreeable.  She has tried twice before to latch baby but was unsuccessful.  She has used the #24 NS, however, this was too large.  Provided a #20 and, upon placement, it appeared to be appropriate without any stimulation.  However, when baby is able to sustain a latch this may be too small. I informed mother that I could better assess after baby latched and sucked.  Assisted to latch without difficulty but baby was only interested in sucking a couple of times before falling asleep.  Demonstrated gentle stimulation and she would suck 2-3 times and stop.  I observed baby to be too sleepy to continue and suggested mother hold her STS.  Placed her under mother's shirt and she immediately fell asleep.  Praised mother for trying and for the success she made with obtaining a latch.  Discussed how it will be a learning process for both mother and baby; breast feeding a LPTI takes time.  Mother verbalized  understanding.    Encouraged mother to call in the future when baby is ready to attempt latching again.  RN updated.   Maternal Data Has patient been taught Hand Expression?: Yes Does the patient have breastfeeding experience prior to this delivery?: Yes  Feeding Feeding Type: Breast Fed  LATCH Score Latch: Too sleepy or reluctant, no latch achieved, no sucking elicited.  Audible Swallowing: None  Type of Nipple: Everted at rest and after stimulation(short shafted)  Comfort (Breast/Nipple): Filling, red/small blisters or bruises, mild/mod discomfort  Hold (Positioning): Assistance needed to correctly position infant at breast and maintain latch.  LATCH Score: 4  Interventions Interventions: Breast feeding basics reviewed;Assisted with latch;Skin to skin;Hand express;Breast compression;Adjust position;DEBP;Position options;Support pillows  Lactation Tools Discussed/Used Breast pump type: Double-Electric Breast Pump   Consult Status Consult Status: PRN Date: 11/30/19 Follow-up type: Call as needed    Anadalay Macdonell R Kyndle Schlender 11/30/2019, 11:34 AM

## 2019-12-05 ENCOUNTER — Ambulatory Visit: Payer: Self-pay

## 2019-12-05 NOTE — Lactation Note (Signed)
This note was copied from a baby's chart. Lactation Consultation Note:   Patient Name: Kathryn Perry Today's Date: 12/05/2019   Follow up with mother and mother reports that she pumped again approx 90 ml and that her breast felt a little softer. Mother has heat to the breast again.  Suggested that mother phone her MD if she began to have a fever.   Mother reports that She attempt to use the #24 NS when attempting to breastfeed [name].  but felt that it was too big for.  [name],s  mouth. Mothers Rt nipple is not as sore as the Lt.  Mother reports that she use the #27 flanges and that pumping felt much better.      Maternal Data    Feeding Feeding Type: Breast Milk  LATCH Score                   Interventions    Lactation Tools Discussed/Used     Consult Status      [name] 12/05/2019, 3:55 PM

## 2019-12-05 NOTE — Lactation Note (Addendum)
This note was copied from a baby's chart. 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  Mother request assistance to latch [name] with 11:00  feeding. Infant is now 95 weeks old at 5-10.  Mother finished pumping as LC arrived in the room. She pumped 90 ml .  Mother reports that her nipples are slightly sore. She has been using a #30 flange.  Observed pink tissue with slight breast tenderness.    Mother has been using a #20 nipple shield.  Mother placed the #20 NS on, but was not on very secure.   She reports that she has latched infant before and she suckled on and off. She reports that she saw milk in the shield and in her mouth.   Infant placed STS in mothers arms.  Assist mother with placing # 20 NS on. Infant latched on and off for several sucks. Infant had the hic-ups . Mother attempt to burp infant.   Infant latched after several attempts. Infant has consistent burst of rhythmic suckles with frequent swallows for 10 mins. Observed that NS was full of milk when removed. Mother reports tenderness of her nipple when infant released the breast.   She also reports that she feels knots and tender spots on the underside of her left breast. Observed that mother has large cake like areas at about 4 and 6 o'clock on the Left breast.  Assist mother with massage and applied heat.  Advised mother to post pump until she felt soft when pumping. Mother reports that she times herself and pumps for 30 mins. But area doesn't get soft. Reports that area has been firm all day.   reviewed S/S of Mastitis and encouraged mother to phone OB if future signs continue and if  unable to resolve .  Mother was given comfort gels and advised to phone OB for RX for [facility]  Mother was given a #24 NS and discussed trying to use instead of the #20  with next feeding attempt.  Reviewed proper application of the nipple shield with mother. Mother also is switching back and forth with flange sizes from # 30-#36. LC feels that flange size is too large and advised mother to pump on low setting and use the smaller flange # 27-# 30.  Mother plans to phone again when wants to have another consult for feeding.    Mother to continue to due STS. Mother is aware of available LC services at [facility], BFSG'S, OP Dept, and phone # for questions or concerns about breastfeeding.  Mother receptive to all teaching and plan of care.     Maternal Data    Feeding Feeding Type: Breast Fed  LATCH Score Latch: Grasps breast easily, tongue down, lips flanged, rhythmical sucking.  Audible Swallowing: Spontaneous and intermittent  Type of Nipple: Everted at rest and after stimulation  Comfort (Breast/Nipple): Filling, red/small blisters or bruises, mild/mod discomfort  Hold (Positioning): Assistance needed to correctly position infant at breast and maintain latch.  LATCH Score: 8  Interventions Interventions: Assisted with latch;Hand express;Breast compression;Adjust position;Support pillows;Expressed milk;Comfort gels;DEBP  Lactation Tools Discussed/Used Tools: Nipple Shields Nipple shield size: 20   Consult Status Consult Status: PRN    [name] 12/05/2019, 1:47 PM

## 2019-12-07 ENCOUNTER — Ambulatory Visit: Payer: Self-pay

## 2019-12-07 NOTE — Lactation Note (Signed)
This note was copied from a baby's chart. Lactation Consultation Note  Patient Name: Kathryn Perry [id number] Date: 12/07/2019 Reason for consult: Follow-up assessment;NICU baby;Multiple gestation  1245 - 1307 - SLP asked lactation to follow up with Kathryn Perry due to mastitis. I checked in on her today, and she states that she saw her provider yesterday and received antibiotic as well as diflucan. She states that she feels marginally better today, and states that she is glad that she "caught it early."  Kathryn Perry states that her clogged ducts have resolved. She is still having issues with her flanges and fit with pumping. She is using a size 27 on the left breast, and this works. She is between a size 27 and 30 on the right, and she's not sure of the best fit.  I am going to follow up with her on Saturday 5/22 and observe her pump for flange fit. Babies are ready for their 72 hour feeding window; Kathryn Perry would like to start this on Saturday after her mastitis has resolved.   Kathryn Perry also asked if she should pre-pump or post-pump when she begins breast feeding. I indicated that I would confer with my colleague and discuss further on Saturday.   We discussed methods for prevention of further clogged ducts including hands on pumping and proper flange fit. Kathryn Perry has a rented symphony pump at home.   Feeding Feeding Type: Breast Milk   Interventions Interventions: Breast feeding basics reviewed  Lactation Tools Discussed/Used Pump Review: Setup, frequency, and cleaning   Consult Status Consult Status: Follow-up Date: 12/09/19 Follow-up type: In-patient    Lenore Manner 12/07/2019, 1:48 PM

## 2019-12-08 ENCOUNTER — Ambulatory Visit: Payer: Self-pay

## 2019-12-08 NOTE — Lactation Note (Signed)
This note was copied from a baby's chart. Lactation Consultation Note  Patient Name: Kathryn Perry [id number] Date: 12/08/2019 Reason for consult: Follow-up assessment;NICU baby;Infant < 6lbs;Late-preterm 34-36.6wks  LC asked to assist with Mom and twins as they begin their 72 hr breastfeeding window.   Babies are [redacted]w[redacted]d (36 weeks old) and being gavage fed fortified EBM  [name] at the breast with 20 mm nipple shield in football hold on left breast when arrived.  Baby had been on for about 10 mins with active sucking and swallowing per RN and Mom.  Baby tiring when [place] arrived.  Nipple pulled well into shield, with milk noted in shield.    Baby B sound asleep when LC unwrapped her and changed her diaper.  Baby woke but no cueing noted.  Baby placed STS at left breast with nipple shield.  Baby opened her mouth to latch onto nipple, baby gave a couple sucks, but no swallows.  Encouraged Mom to continue to have baby latched and STS.  Baby didn't show any effort when trying to stimulate her to suckle on a finger as well.  Reassured Mom that baby may do better at next feeding.  Mom relaxed and good with the situation.  Plan- 1- Offer breast every 3 hrs or when babies cue to eat. 2- use nipple shield to help with latch and stimulate baby's suck 3- Pump both breasts 15-30 mins after babies go to breast. 4- lots of STS when able to.  Broadus John 12/08/2019, 1:05 PM

## 2019-12-10 ENCOUNTER — Ambulatory Visit: Payer: Self-pay

## 2019-12-10 NOTE — Lactation Note (Signed)
This note was copied from a baby's chart. Lactation Consultation Note  Patient Name: Kathryn Perry [id number] Date: 12/10/2019 Reason for consult: Follow-up assessment;Mother's request;NICU baby;Preterm <34wks   [id number] - [id number] - I followed up with [name]. She is current in day 2 of [name] and [name]'s 72 hour breast feeding window. [name] states that she has been becoming a bit sore from breast feeding and notes that babies have anterior frenulums. She states that her now 1 year old daughter also had a frenulum, and she had it revised while at the hospital. She is interested in having speech and the pediatrician do an oral exam of both babies.  [name] was breast feeding upon entry on the right breast. Ms. Hose was using a size 20 nipple shield. She denied pain with this latch. I noted good rhythmic suckling sequences and spontaneous swallows. When baby finished feeding after approximately 20 minutes, we noted milk in the nipple shield.  While [name] ([name]) fed, I observed [name]. 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 [name] states that [name]'s latch is more uncomfortable than [name]'s.   When [name] finished at the breast, we placed [name] on the left breast in cradle hold. She latched readily to the 20 Nipple shield. I noted that her upper lip was slightly tucked, and she had a tight jaw with some dimpling while suckling. Baby suckled sleepily and needed stimulation. We sized up to a size 24 nipple shield. [name] did not note any significant improvement in sensation. However, [name] had deeper jaw excursions with this size.   [name]'s feeding was sleepy; she needed stimulation to continue with suckling. [name] notes this is fairly typical.  When [name] finished, we did not note significant breast milk in the shield, but [name]'s mouth was wet with breast milk.  [name] is post-pumping. I encouraged her to continue to do  so at this time.   Babies are breast feeding 8 times a day. I praised [name] and the babies for doing such a great job with breast feeding. Lactation follow up tomorrow recommended.   In terms of mastitis, [name] states that she feels much better, and she remains on prescribed antibiotics.   Feeding Feeding Type: Breast Fed  LATCH Score Latch: Repeated attempts needed to sustain latch, nipple held in mouth throughout feeding, stimulation needed to elicit sucking reflex.  Audible Swallowing: Spontaneous and intermittent  Type of Nipple: Everted at rest and after stimulation  Comfort (Breast/Nipple): Soft / non-tender  Hold (Positioning): No assistance needed to correctly position infant at breast.  LATCH Score: 9  Interventions Interventions: Support pillows  Lactation Tools Discussed/Used Tools: Nipple Shields Nipple shield size: 20;Other (comment)(LC increased to 24)   Consult Status Consult Status: Follow-up Date: 12/11/19 Follow-up type: In-patient    Lenore Manner 12/10/2019, 9:22 AM

## 2020-04-05 ENCOUNTER — Ambulatory Visit
Admission: RE | Admit: 2020-04-05 | Discharge: 2020-04-05 | Disposition: A | Discharge status: Home / Self Care | Payer: BC Managed Care – PPO | Source: Ambulatory Visit | Attending: Obstetrics & Gynecology | Admitting: Obstetrics & Gynecology

## 2020-04-05 ENCOUNTER — Other Ambulatory Visit: Payer: Self-pay | Admitting: Obstetrics & Gynecology

## 2020-04-05 ENCOUNTER — Other Ambulatory Visit: Payer: Self-pay

## 2020-04-05 DIAGNOSIS — T8332XA Displacement of intrauterine contraceptive device, initial encounter: Secondary | ICD-10-CM

## 2020-04-08 ENCOUNTER — Other Ambulatory Visit: Payer: Self-pay

## 2020-04-08 ENCOUNTER — Encounter (HOSPITAL_BASED_OUTPATIENT_CLINIC_OR_DEPARTMENT_OTHER): Payer: Self-pay | Admitting: Obstetrics & Gynecology

## 2020-04-08 ENCOUNTER — Other Ambulatory Visit: Payer: Self-pay | Admitting: Obstetrics & Gynecology

## 2020-04-08 NOTE — Progress Notes (Addendum)
Spoke w/ via phone for pre-op interview---pt Lab needs dos----  Urine poct             Lab results------none COVID test ------04-10-2020 at 955 am [facility] Arrive at -------800 am 04-12-2020 NPO after MN NO Solid Food.  Clear liquids from MN until---800 am then npo Medications to take morning of surgery -----none Diabetic medication -----n/a Patient Special Instructions -----none Pre-Op special Istructions -----none Patient verbalized understanding of instructions that were given at this phone interview. Patient denies shortness of breath, chest pain, fever, cough at this phone interview.

## 2020-04-10 ENCOUNTER — Other Ambulatory Visit
Admission: RE | Admit: 2020-04-10 | Discharge: 2020-04-10 | Disposition: A | Discharge status: Home / Self Care | Payer: BC Managed Care – PPO | Source: Ambulatory Visit | Attending: Obstetrics & Gynecology | Admitting: Obstetrics & Gynecology

## 2020-04-10 ENCOUNTER — Other Ambulatory Visit: Payer: Self-pay

## 2020-04-10 DIAGNOSIS — Z20822 Contact with and (suspected) exposure to covid-19: Secondary | ICD-10-CM | POA: Diagnosis not present

## 2020-04-10 DIAGNOSIS — Z01812 Encounter for preprocedural laboratory examination: Secondary | ICD-10-CM | POA: Insufficient documentation

## 2020-04-10 LAB — SARS CORONAVIRUS 2 (TAT 6-24 HRS): SARS Coronavirus 2: NEGATIVE

## 2020-04-11 ENCOUNTER — Encounter (HOSPITAL_BASED_OUTPATIENT_CLINIC_OR_DEPARTMENT_OTHER): Payer: Self-pay | Admitting: Obstetrics & Gynecology

## 2020-04-11 NOTE — Anesthesia Preprocedure Evaluation (Addendum)
Anesthesia Evaluation  Patient identified by MRN, date of birth, ID band Patient awake    Reviewed: Allergy & Precautions, NPO status , Patient's Chart, lab work & pertinent test results  Airway Mallampati: I  TM Distance: >3 FB Neck ROM: Full    Dental no notable dental hx. (+) Dental Advisory Given, Teeth Intact   Pulmonary neg pulmonary ROS,    Pulmonary exam normal breath sounds clear to auscultation       Cardiovascular negative cardio ROS Normal cardiovascular exam Rhythm:Regular Rate:Normal     Neuro/Psych negative neurological ROS  negative psych ROS   GI/Hepatic Neg liver ROS, GERD  ,  Endo/Other  negative endocrine ROS  Renal/GU negative Renal ROS  negative genitourinary   Musculoskeletal negative musculoskeletal ROS (+)   Abdominal   Peds  Hematology  (+) anemia ,   Anesthesia Other Findings   Reproductive/Obstetrics negative OB ROS Displaced IUD Hx/o previous C/Section                                                             Anesthesia Evaluation  Patient identified by MRN, date of birth, ID band Patient awake    Reviewed: Allergy & Precautions, NPO status , Patient's Chart, lab work & pertinent test results  History of Anesthesia Complications Negative for: history of anesthetic complications  Airway Mallampati: II  TM Distance: >3 FB Neck ROM: Full    Dental no notable dental hx.    Pulmonary neg pulmonary ROS,    Pulmonary exam normal        Cardiovascular negative cardio ROS Normal cardiovascular exam     Neuro/Psych Anxiety negative neurological ROS     GI/Hepatic negative GI ROS, Neg liver ROS,   Endo/Other  negative endocrine ROS  Renal/GU negative Renal ROS  negative genitourinary   Musculoskeletal negative musculoskeletal ROS (+)   Abdominal   Peds  Hematology  (+) anemia , Hgb 9.4, Plt 167   Anesthesia Other  Findings Day of surgery medications reviewed with patient.  Reproductive/Obstetrics (+) Pregnancy (twin gestation, vaginal bleeding)                            Anesthesia Physical Anesthesia Plan  ASA: II  Anesthesia Plan: Spinal   Post-op Pain Management:    Induction:   PONV Risk Score and Plan: 4 or greater and Treatment may vary due to age or medical condition, Ondansetron and Dexamethasone  Airway Management Planned: Natural Airway  Additional Equipment: None  Intra-op Plan:   Post-operative Plan:   Informed Consent: I have reviewed the patients History and Physical, chart, labs and discussed the procedure including the risks, benefits and alternatives for the proposed anesthesia with the patient or authorized representative who has indicated his/her understanding and acceptance.       Plan Discussed with: CRNA  Anesthesia Plan Comments:        Anesthesia Quick Evaluation  Anesthesia Physical Anesthesia Plan  ASA: I  Anesthesia Plan: General   Post-op Pain Management:    Induction: Intravenous  PONV Risk Score and Plan: 4 or greater and Treatment may vary due to age or medical condition, Midazolam and Ondansetron  Airway Management Planned: Oral ETT  Additional Equipment: None  Intra-op Plan:  Post-operative Plan: Extubation in OR  Informed Consent: I have reviewed the patients History and Physical, chart, labs and discussed the procedure including the risks, benefits and alternatives for the proposed anesthesia with the patient or authorized representative who has indicated his/her understanding and acceptance.     Dental advisory given  Plan Discussed with: CRNA  Anesthesia Plan Comments:       Anesthesia Quick Evaluation

## 2020-04-12 ENCOUNTER — Encounter (HOSPITAL_BASED_OUTPATIENT_CLINIC_OR_DEPARTMENT_OTHER): Payer: Self-pay | Admitting: Obstetrics & Gynecology

## 2020-04-12 ENCOUNTER — Ambulatory Visit (HOSPITAL_BASED_OUTPATIENT_CLINIC_OR_DEPARTMENT_OTHER)
Admission: RE | Admit: 2020-04-12 | Discharge: 2020-04-12 | Disposition: A | Discharge status: Home / Self Care | Payer: BC Managed Care – PPO | Attending: Obstetrics & Gynecology | Admitting: Obstetrics & Gynecology

## 2020-04-12 ENCOUNTER — Encounter (HOSPITAL_BASED_OUTPATIENT_CLINIC_OR_DEPARTMENT_OTHER)
Admission: RE | Disposition: A | Discharge status: Home / Self Care | Payer: Self-pay | Source: Home / Self Care | Attending: Obstetrics & Gynecology

## 2020-04-12 ENCOUNTER — Ambulatory Visit (HOSPITAL_BASED_OUTPATIENT_CLINIC_OR_DEPARTMENT_OTHER): Payer: BC Managed Care – PPO | Admitting: Anesthesiology

## 2020-04-12 DIAGNOSIS — Z8759 Personal history of other complications of pregnancy, childbirth and the puerperium: Secondary | ICD-10-CM | POA: Diagnosis not present

## 2020-04-12 DIAGNOSIS — T8332XA Displacement of intrauterine contraceptive device, initial encounter: Secondary | ICD-10-CM | POA: Diagnosis present

## 2020-04-12 DIAGNOSIS — Y762 Prosthetic and other implants, materials and accessory obstetric and gynecological devices associated with adverse incidents: Secondary | ICD-10-CM | POA: Insufficient documentation

## 2020-04-12 DIAGNOSIS — Z886 Allergy status to analgesic agent status: Secondary | ICD-10-CM | POA: Insufficient documentation

## 2020-04-12 DIAGNOSIS — Z793 Long term (current) use of hormonal contraceptives: Secondary | ICD-10-CM | POA: Diagnosis not present

## 2020-04-12 HISTORY — PX: IUD REMOVAL: SHX5392

## 2020-04-12 HISTORY — PX: LAPAROSCOPY: SHX197

## 2020-04-12 HISTORY — DX: Gastro-esophageal reflux disease without esophagitis: K21.9

## 2020-04-12 LAB — POCT PREGNANCY, URINE: Preg Test, Ur: NEGATIVE

## 2020-04-12 LAB — CBC
HCT: 38.1 % (ref 36.0–46.0)
Hemoglobin: 12.3 g/dL (ref 12.0–15.0)
MCH: 27.8 pg (ref 26.0–34.0)
MCHC: 32.3 g/dL (ref 30.0–36.0)
MCV: 86.2 fL (ref 80.0–100.0)
Platelets: 261 10*3/uL (ref 150–400)
RBC: 4.42 MIL/uL (ref 3.87–5.11)
RDW: 14.7 % (ref 11.5–15.5)
WBC: 5.1 10*3/uL (ref 4.0–10.5)
nRBC: 0 % (ref 0.0–0.2)

## 2020-04-12 SURGERY — LAPAROSCOPY OPERATIVE
Anesthesia: General | Site: Abdomen

## 2020-04-12 MED ORDER — CEFAZOLIN SODIUM-DEXTROSE 2-4 GM/100ML-% IV SOLN
INTRAVENOUS | Status: AC
  Filled 2020-04-12: qty 100

## 2020-04-12 MED ORDER — OXYCODONE HCL 5 MG PO TABS: 5.0000 mg | ORAL_TABLET | ORAL | Status: DC | PRN

## 2020-04-12 MED ORDER — ONDANSETRON HCL 4 MG/2ML IJ SOLN: 4.0000 mg | Freq: Four times a day (QID) | INTRAMUSCULAR | Status: DC | PRN

## 2020-04-12 MED ORDER — DEXAMETHASONE SODIUM PHOSPHATE 10 MG/ML IJ SOLN
INTRAMUSCULAR | Status: DC | PRN
  Administered 2020-04-12: 5 mg via INTRAVENOUS

## 2020-04-12 MED ORDER — PROMETHAZINE HCL 25 MG/ML IJ SOLN: 6.2500 mg | INTRAMUSCULAR | Status: DC | PRN

## 2020-04-12 MED ORDER — SILVER NITRATE-POT NITRATE 75-25 % EX MISC
CUTANEOUS | Status: DC | PRN
  Administered 2020-04-12: 3

## 2020-04-12 MED ORDER — FENTANYL CITRATE (PF) 100 MCG/2ML IJ SOLN
50.0000 ug | INTRAMUSCULAR | Status: DC | PRN
  Administered 2020-04-12 (×2): 50 ug via INTRAVENOUS

## 2020-04-12 MED ORDER — FERRIC SUBSULFATE SOLN
Status: DC | PRN
  Administered 2020-04-12: 1

## 2020-04-12 MED ORDER — LIDOCAINE 2% (20 MG/ML) 5 ML SYRINGE
INTRAMUSCULAR | Status: DC | PRN
  Administered 2020-04-12: 100 mg via INTRAVENOUS

## 2020-04-12 MED ORDER — PROPOFOL 10 MG/ML IV BOLUS
INTRAVENOUS | Status: DC | PRN
  Administered 2020-04-12: 150 mg via INTRAVENOUS

## 2020-04-12 MED ORDER — ROCURONIUM BROMIDE 10 MG/ML (PF) SYRINGE
PREFILLED_SYRINGE | INTRAVENOUS | Status: AC
  Filled 2020-04-12: qty 10

## 2020-04-12 MED ORDER — FENTANYL CITRATE (PF) 100 MCG/2ML IJ SOLN
INTRAMUSCULAR | Status: AC
  Filled 2020-04-12: qty 2

## 2020-04-12 MED ORDER — SUGAMMADEX SODIUM 200 MG/2ML IV SOLN
INTRAVENOUS | Status: DC | PRN
  Administered 2020-04-12: 140 mg via INTRAVENOUS

## 2020-04-12 MED ORDER — DIBUCAINE (PERIANAL) 1 % EX OINT
TOPICAL_OINTMENT | CUTANEOUS | Status: DC | PRN
  Administered 2020-04-12: 1 via RECTAL

## 2020-04-12 MED ORDER — ONDANSETRON HCL 4 MG PO TABS: 4.0000 mg | ORAL_TABLET | Freq: Four times a day (QID) | ORAL | Status: DC | PRN

## 2020-04-12 MED ORDER — ROCURONIUM BROMIDE 10 MG/ML (PF) SYRINGE
PREFILLED_SYRINGE | INTRAVENOUS | Status: DC | PRN
  Administered 2020-04-12: 50 mg via INTRAVENOUS

## 2020-04-12 MED ORDER — LACTATED RINGERS IV SOLN: INTRAVENOUS | Status: DC

## 2020-04-12 MED ORDER — DEXAMETHASONE SODIUM PHOSPHATE 10 MG/ML IJ SOLN
INTRAMUSCULAR | Status: AC
  Filled 2020-04-12: qty 1

## 2020-04-12 MED ORDER — MEPERIDINE HCL 25 MG/ML IJ SOLN: 6.2500 mg | INTRAMUSCULAR | Status: DC | PRN

## 2020-04-12 MED ORDER — HYDROMORPHONE HCL 1 MG/ML IJ SOLN: 0.2500 mg | INTRAMUSCULAR | Status: DC | PRN

## 2020-04-12 MED ORDER — FENTANYL CITRATE (PF) 100 MCG/2ML IJ SOLN
INTRAMUSCULAR | Status: DC | PRN
Start: 2020-04-12 — End: 2020-04-12
  Administered 2020-04-12 (×2): 50 ug via INTRAVENOUS

## 2020-04-12 MED ORDER — SODIUM CHLORIDE 0.9 % IR SOLN
Status: DC | PRN
  Administered 2020-04-12: 500 mL

## 2020-04-12 MED ORDER — LEVONORGESTREL 20 MCG/24HR IU IUD: INTRAUTERINE_SYSTEM | INTRAUTERINE | Status: DC

## 2020-04-12 MED ORDER — MEDROXYPROGESTERONE ACETATE 150 MG/ML IM SUSP
150.0000 mg | Freq: Once | INTRAMUSCULAR | Status: AC
  Administered 2020-04-12: 150 mg via INTRAMUSCULAR
  Filled 2020-04-12: qty 1

## 2020-04-12 MED ORDER — ONDANSETRON HCL 8 MG PO TABS: 8.0000 mg | ORAL_TABLET | Freq: Three times a day (TID) | ORAL | 0 refills | Status: AC | PRN

## 2020-04-12 MED ORDER — OXYCODONE HCL 5 MG PO TABS: 5.0000 mg | ORAL_TABLET | Freq: Once | ORAL | Status: DC | PRN

## 2020-04-12 MED ORDER — TRAMADOL HCL 50 MG PO TABS: 50.0000 mg | ORAL_TABLET | Freq: Four times a day (QID) | ORAL | Status: DC | PRN

## 2020-04-12 MED ORDER — ACETAMINOPHEN 10 MG/ML IV SOLN
INTRAVENOUS | Status: DC | PRN
  Administered 2020-04-12: 1000 mg via INTRAVENOUS

## 2020-04-12 MED ORDER — PROPOFOL 10 MG/ML IV BOLUS
INTRAVENOUS | Status: AC
  Filled 2020-04-12: qty 20

## 2020-04-12 MED ORDER — MIDAZOLAM HCL 2 MG/2ML IJ SOLN
INTRAMUSCULAR | Status: AC
  Filled 2020-04-12: qty 2

## 2020-04-12 MED ORDER — HYDROCODONE-ACETAMINOPHEN 5-325 MG PO TABS: 1.0000 | ORAL_TABLET | ORAL | 0 refills | Status: DC | PRN

## 2020-04-12 MED ORDER — MIDAZOLAM HCL 2 MG/2ML IJ SOLN
INTRAMUSCULAR | Status: DC | PRN
  Administered 2020-04-12: 2 mg via INTRAVENOUS

## 2020-04-12 MED ORDER — ONDANSETRON HCL 4 MG/2ML IJ SOLN
INTRAMUSCULAR | Status: AC
  Filled 2020-04-12: qty 2

## 2020-04-12 MED ORDER — LIDOCAINE 2% (20 MG/ML) 5 ML SYRINGE
INTRAMUSCULAR | Status: AC
  Filled 2020-04-12: qty 5

## 2020-04-12 MED ORDER — POVIDONE-IODINE 10 % EX SWAB: 2.0000 "application " | Freq: Once | CUTANEOUS | Status: DC

## 2020-04-12 MED ORDER — CEFAZOLIN SODIUM-DEXTROSE 2-4 GM/100ML-% IV SOLN
2.0000 g | INTRAVENOUS | Status: AC
  Administered 2020-04-12: 2 g via INTRAVENOUS

## 2020-04-12 MED ORDER — BUPIVACAINE HCL 0.25 % IJ SOLN
INTRAMUSCULAR | Status: DC | PRN
  Administered 2020-04-12: 30 mL

## 2020-04-12 MED ORDER — ONDANSETRON HCL 4 MG/2ML IJ SOLN
INTRAMUSCULAR | Status: DC | PRN
  Administered 2020-04-12: 4 mg via INTRAVENOUS

## 2020-04-12 MED ORDER — ACETAMINOPHEN 10 MG/ML IV SOLN
INTRAVENOUS | Status: AC
  Filled 2020-04-12: qty 100

## 2020-04-12 MED ORDER — OXYCODONE HCL 5 MG/5ML PO SOLN: 5.0000 mg | Freq: Once | ORAL | Status: DC | PRN

## 2020-04-12 MED ORDER — MENTHOL 3 MG MT LOZG: 1.0000 | LOZENGE | OROMUCOSAL | Status: DC | PRN

## 2020-04-12 MED ORDER — SIMETHICONE 80 MG PO CHEW: 80.0000 mg | CHEWABLE_TABLET | Freq: Four times a day (QID) | ORAL | Status: DC | PRN

## 2020-04-12 SURGICAL SUPPLY — 48 items
ADH SKN CLS APL DERMABOND .7 (GAUZE/BANDAGES/DRESSINGS) ×1
APL SKNCLS STERI-STRIP NONHPOA (GAUZE/BANDAGES/DRESSINGS)
BAG RETRIEVAL 10 (BASKET)
BAG RETRIEVAL 10MM (BASKET)
BENZOIN TINCTURE PRP APPL 2/3 (GAUZE/BANDAGES/DRESSINGS) IMPLANT
CABLE HIGH FREQUENCY MONO STRZ (ELECTRODE) IMPLANT
CATH ROBINSON RED A/P 16FR (CATHETERS) IMPLANT
CLOSURE WOUND 1/4X4 (GAUZE/BANDAGES/DRESSINGS)
COVER MAYO STAND STRL (DRAPES) ×3 IMPLANT
COVER WAND RF STERILE (DRAPES) ×3 IMPLANT
DERMABOND ADVANCED (GAUZE/BANDAGES/DRESSINGS) ×2
DERMABOND ADVANCED .7 DNX12 (GAUZE/BANDAGES/DRESSINGS) ×1 IMPLANT
DRSG COVADERM PLUS 2X2 (GAUZE/BANDAGES/DRESSINGS) IMPLANT
DRSG OPSITE POSTOP 3X4 (GAUZE/BANDAGES/DRESSINGS) ×3 IMPLANT
DURAPREP 26ML APPLICATOR (WOUND CARE) ×3 IMPLANT
ELECT REM PT RETURN 9FT ADLT (ELECTROSURGICAL) ×3
ELECTRODE REM PT RTRN 9FT ADLT (ELECTROSURGICAL) ×1 IMPLANT
GAUZE 4X4 16PLY RFD (DISPOSABLE) ×3 IMPLANT
GLOVE BIO SURGEON STRL SZ 6.5 (GLOVE) ×2 IMPLANT
GLOVE BIO SURGEONS STRL SZ 6.5 (GLOVE) ×1
GLOVE BIOGEL PI IND STRL 7.0 (GLOVE) ×1 IMPLANT
GLOVE BIOGEL PI INDICATOR 7.0 (GLOVE) ×2
GOWN STRL REUS W/TWL LRG LVL3 (GOWN DISPOSABLE) ×3 IMPLANT
HOLDER FOLEY CATH W/STRAP (MISCELLANEOUS) IMPLANT
KIT TURNOVER CYSTO (KITS) ×3 IMPLANT
LIGASURE VESSEL 5MM BLUNT TIP (ELECTROSURGICAL) IMPLANT
MANIPULATOR VCARE LG CRV RETR (MISCELLANEOUS) IMPLANT
MANIPULATOR VCARE SML CRV RETR (MISCELLANEOUS) IMPLANT
NS IRRIG 500ML POUR BTL (IV SOLUTION) ×3 IMPLANT
PACK LAPAROSCOPY BASIN (CUSTOM PROCEDURE TRAY) ×3 IMPLANT
PAD OB MATERNITY 4.3X12.25 (PERSONAL CARE ITEMS) ×3 IMPLANT
PAD PREP 24X48 CUFFED NSTRL (MISCELLANEOUS) ×3 IMPLANT
SET SUCTION IRRIG HYDROSURG (IRRIGATION / IRRIGATOR) IMPLANT
SET TUBE SMOKE EVAC HIGH FLOW (TUBING) ×3 IMPLANT
SOLUTION ELECTROLUBE (MISCELLANEOUS) IMPLANT
STRIP CLOSURE SKIN 1/4X4 (GAUZE/BANDAGES/DRESSINGS) IMPLANT
SUT MNCRL AB 4-0 PS2 18 (SUTURE) ×3 IMPLANT
SUT VICRYL 0 UR6 27IN ABS (SUTURE) IMPLANT
SYS BAG RETRIEVAL 10MM (BASKET)
SYSTEM BAG RETRIEVAL 10MM (BASKET) IMPLANT
TOWEL OR 17X26 10 PK STRL BLUE (TOWEL DISPOSABLE) ×6 IMPLANT
TRAY FOLEY W/BAG SLVR 14FR LF (SET/KITS/TRAYS/PACK) IMPLANT
TROCAR BLADELESS OPT 5 100 (ENDOMECHANICALS) ×3 IMPLANT
TROCAR XCEL NON-BLD 11X100MML (ENDOMECHANICALS) IMPLANT
TUBE CONNECTING 12'X1/4 (SUCTIONS)
TUBE CONNECTING 12X1/4 (SUCTIONS) IMPLANT
WARMER LAPAROSCOPE (MISCELLANEOUS) ×3 IMPLANT
WATER STERILE IRR 500ML POUR (IV SOLUTION) ×3 IMPLANT

## 2020-04-12 NOTE — H&P (Signed)
Kathryn Perry is an 34 y.o. female presents for scheduled operative laparoscopy for misplaced Mirena IUD.  The IUD was placed on 03/08/20 and it was a difficult placement.  Patient returned to office 9/17 for gyn ultrasound at which time the IUD was not seen in the endometrium.  Patient sent to [facility] for an urgent Abdominal X Ray and it was determined that the IUD was possibly in the posterior cul de sac.  Patient denies abdominal or pelvic pain.  She denies fever, chills or any bowel symptoms.   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    Past Medical History:  Diagnosis Date  . Endometriosis   . GERD (gastroesophageal reflux disease)   . Missed ab   . Vaginal Pap smear, abnormal   . Wears glasses     Past Surgical History:  Procedure Laterality Date  . CESAREAN SECTION N/A 10/07/2017   Procedure: CESAREAN SECTION;  Surgeon: Sanjuana Kava, MD;  Location: [place];  Service: Obstetrics;  Laterality: N/A;  . CESAREAN SECTION MULTI-GESTATIONAL N/A 11/20/2019   Procedure: CESAREAN SECTION MULTI-GESTATIONAL;  Surgeon: Sanjuana Kava, MD;  Location: [facility];  Service: Obstetrics;  Laterality: N/A;  . CHROMOPERTUBATION N/A 06/26/2015   Procedure: CHROMOPERTUBATION;  Surgeon: Eldred Manges, MD;  Location: [facility];  Service: Gynecology;  Laterality: N/A;  . DILATION AND CURETTAGE OF UTERUS    . DILATION AND EVACUATION N/A 03/27/2016   Procedure: DILATATION AND EVACUATION;  Surgeon: Eldred Manges, MD;  Location: [place];  Service: Gynecology;  Laterality: N/A;  .  LAPAROSCOPIC OVARIAN CYSTECTOMY N/A 06/26/2015   Procedure: LAPAROSCOPIC OVARIAN CYSTECTOMY;  Surgeon: Eldred Manges, MD;  Location: [facility];  Service: Gynecology;  Laterality: N/A;  . LAPAROSCOPY N/A 06/26/2015   Procedure: LAPAROSCOPY OPERATIVE with excision of fEndometriosis and ablation of endometriosis;  Surgeon: Eldred Manges, MD;  Location: [facility];  Service: Gynecology;  Laterality: N/A;  . LEEP  2015  . WISDOM TOOTH EXTRACTION  yrs ago    Family History  Problem Relation Age of Onset  . Colon cancer Paternal Grandfather     Social History:  reports that she has never smoked. She has never used smokeless tobacco. She reports current alcohol use. She reports that she does not use drugs.  Allergies:  Allergies  Allergen Reactions  . Nsaids Hives    Aleve (naproxen), Ibuprofen, ASA  . Asa [Aspirin] Hives    Medications Prior to Admission  Medication Sig Dispense Refill Last Dose  . levonorgestrel (MIRENA) 20 MCG/24HR IUD 1 each by Intrauterine route once. Inserted aug 20th 2021   04/12/2020 at Unknown time  . Prenatal Vit-Fe Fumarate-FA (MULTIVITAMIN-PRENATAL) 27-0.8 MG TABS tablet Take 1 tablet by mouth daily at 12 noon. One a day women prenatal   Past Month at Unknown time  . acetaminophen (TYLENOL) 500 MG tablet Take 2 tablets (1,000 mg total) by mouth every 6 (six) hours as needed for moderate pain (Pain at surgical site). 30 tablet 0 04/10/2020    Review of Systems as per HPI  Height 5\' 1"  (1.549 m), weight 66.2 kg, last menstrual period 04/08/2020, unknown if currently breastfeeding. Physical Exam  Pelvic Exam deferred to OR In office: normal anteverted uterus, IUD strings not seen.   Results for orders placed or performed during the hospital encounter of 04/12/20 (from the past 24 hour(s))  Pregnancy, urine POC     Status: None   Collection Time: 04/12/20  8:29 AM  Result Value Ref Range   Preg Test, Ur NEGATIVE NEGATIVE    No results  found.  Assessment/Plan: 34 year old with misplaced IUD possibly in peritoneal cavity On call to OR for operative laparoscopy removal of IUD  Sanjuana Kava 04/12/2020, 8:47 AM

## 2020-04-12 NOTE — Transfer of Care (Signed)
Immediate Anesthesia Transfer of Care Note  Patient: Kathryn Perry  Procedure(s) Performed: LAPAROSCOPY OPERATIVE (N/A Abdomen) INTRAUTERINE DEVICE (IUD) REMOVAL (N/A Abdomen)  Patient Location: PACU  Anesthesia Type:General  Level of Consciousness: awake, alert , oriented and patient cooperative  Airway & Oxygen Therapy: Patient Spontanous Breathing and Patient connected to nasal cannula oxygen  Post-op Assessment: Report given to RN, Post -op Vital signs reviewed and stable and Patient moving all extremities  Post vital signs: Reviewed and stable  Last Vitals:  Vitals Value Taken Time  BP    Temp 36.3 C 04/12/20 1109  Pulse 84 04/12/20 1109  Resp 11 04/12/20 1111  SpO2 100 % 04/12/20 1109  Vitals shown include unvalidated device data.  Last Pain:  Vitals:   04/12/20 0847  TempSrc: Oral  PainSc: 0-No pain      Patients Stated Pain Goal: 5 (68/61/68 3729)  Complications: No complications documented.

## 2020-04-12 NOTE — Op Note (Signed)
OPERATIVE NOTE  Kathryn Perry  DOB:    [birth year]  MRN:    [id number]  CSN:    [id number]  Date of Surgery:  04/12/2020  Preop Diagnosis: displaced Iintrauterine Device   Postop Diagnosis: displaced Intrauterine Device  Procedure: LAPAROSCOPY OPERATIVE   Anesthesia: General ETA  Surgeon: Mady Haagensen. Alwyn Pea, M.D.  Assistant: None  Disposition: The patient presents with the above-mentioned diagnosis. She understands the indications for surgical procedure.  She also understands the alternative treatment options. She accepts the risk of, but not limited to, anesthetic complications, bleeding, infections, and possible damage to the surrounding organs.  Indication: Misplaced Mirena IUD determined at office and confirmed with abdominal x-ray  Findings: Exam under anesthesia: External vulva normal vaginal vault: decreased tone and ruggae, rectocele grade 1.  Cervix flush with vaginal wall. Laparoscopic findings: normal appearing uterus, normal bilateral fallopian tubes and ovaries bilaterally.  Old endometriosis scarring right posterior adnexal fossa.  IUD strings visualized in left mid pelvis entangled in omentum.  Removed easily.   Fluids: 1200 mL  Catheter: Foley during case (removed prior to PACU)  EBL: 30 mL  Complications: none  Procedure: The patient was taken to the operating room after the risks, benefits, alternatives, complications, treatment options, and expected outcomes were discussed with the patient. The patient verbalized understanding, the patient concurred with the proposed plan and consent signed and witnessed. The patient was taken to the Operating Room, identified as [facility] and the procedure verified as laparoscopic ovarian cystectomy. A Time Out was held and the above information confirmed.  The patient was taken to the operating room after appropriate identification and placed on the operating table. After the attainment of adequate general  anesthesia she was placed in the modified lithotomy position using Allen stirrups. Both upper extremities were padded and placed by her side. An examination under anesthesia was performed.  The abdomen was prepped with ChloraPrep. The perineum and vagina were prepped with multiple layers of Betadine.  The bladder was catheterized. The abdomen and perineum were draped as a sterile field. An operative Graves speculum was placed.   A single tooth tenaculum was placed on the anterior lip of the cervix then the acorn uterine manipulator was placed in the endocervix and attached to the tenaculum. The Graves speculum was removed.    The surgeon gowned and re-gloved.    A 12 mm midline infra-umbilical incision was made after infiltration with 0.25% Marcaine. The 53mm Excel Visiport was attached to a 0 degree 10 mm laparoscope and used to enter the abdomen under direct visualization. Entry was confirmed and the abdomen was insufflated (creating pneumoperitoneum) with approximately 3L CO2 gas. The patient was placed in steep trendelenburg, and a complete abdominal and pelvic survey was performed with findings noted above. There was no noted injury with placement of the trochar.    The operative scope was then utilized and introduced into the abdomen. The long atraumatic [place] endoscopic graspers were used to teased the IUD from the omental fat and it was brought out of the abdomen through the trochar and handed off the field. The abdomen was reassessed for any bleeding or trauma and there was none noted.    The CO2 was allowed to escape from the open umbilical port prior to removal. The umbilical incision was infiltrated with 10 mL more of 0.25% of Marcaine and closed with one fascial suture of 2-0 vicryl on a UR-6 needle. The skin was closed in subcuticular fashion with 3-0 Monocryl  then covered with Dermabond and a honeycomb dressing.   The tenaculum and uterine manipulator was removed as well as the Foley  catheter. There was brisk bleeding from the tenaculum sites and silver nitrate and Monsels solution had to be used to stop the oozing.  Hemostasis was achieved.  The patient was awakened from general anesthesia and taken to the recovery room in satisfactory condition having tolerated the procedure well with sponge and instrument counts correct. It is anticipated that she will be discharged later this afternoon from the PACU.    Rogelio Seen Ansar Skoda

## 2020-04-12 NOTE — Anesthesia Procedure Notes (Signed)
Procedure Name: Intubation Date/Time: 04/12/2020 10:01 AM Performed by: Suan Halter, CRNA Pre-anesthesia Checklist: Patient identified, Emergency Drugs available, Suction available and Patient being monitored Patient Re-evaluated:Patient Re-evaluated prior to induction Oxygen Delivery Method: Circle system utilized Preoxygenation: Pre-oxygenation with 100% oxygen Induction Type: IV induction Ventilation: Mask ventilation without difficulty LMA Size: 3.0 Laryngoscope Size: Mac Grade View: Grade I Tube type: Oral Tube size: 7.0 mm Number of attempts: 1 Airway Equipment and Method: Stylet and Oral airway Placement Confirmation: ETT inserted through vocal cords under direct vision,  positive ETCO2 and breath sounds checked- equal and bilateral Secured at: 22 cm Tube secured with: Tape Dental Injury: Teeth and Oropharynx as per pre-operative assessment

## 2020-04-12 NOTE — Anesthesia Postprocedure Evaluation (Signed)
Anesthesia Post Note  Patient: Kathryn Perry  Procedure(s) Performed: LAPAROSCOPY OPERATIVE (N/A Abdomen) INTRAUTERINE DEVICE (IUD) REMOVAL (N/A Abdomen)     Patient location during evaluation: PACU Anesthesia Type: General Level of consciousness: sedated and patient cooperative Pain management: pain level controlled Vital Signs Assessment: post-procedure vital signs reviewed and stable Respiratory status: spontaneous breathing Cardiovascular status: stable Anesthetic complications: no   No complications documented.  Last Vitals:  Vitals:   04/12/20 1215 04/12/20 1326  BP: 129/88 (!) 150/90  Pulse: (!) 57 (!) 57  Resp: 16 15  Temp:  36.4 C  SpO2: 91% 98%    Last Pain:  Vitals:   04/12/20 1326  TempSrc: Oral  PainSc: 0-No pain                 Nolon Nations

## 2020-04-12 NOTE — Discharge Instructions (Signed)

## 2020-04-15 ENCOUNTER — Encounter (HOSPITAL_BASED_OUTPATIENT_CLINIC_OR_DEPARTMENT_OTHER): Payer: Self-pay | Admitting: Obstetrics & Gynecology

## 2020-11-29 ENCOUNTER — Other Ambulatory Visit: Payer: Self-pay

## 2020-11-29 ENCOUNTER — Telehealth: Payer: Self-pay | Admitting: Nurse Practitioner

## 2020-11-29 DIAGNOSIS — J111 Influenza due to unidentified influenza virus with other respiratory manifestations: Secondary | ICD-10-CM

## 2020-11-29 MED ORDER — OSELTAMIVIR PHOSPHATE 75 MG PO CAPS: 75.0000 mg | ORAL_CAPSULE | Freq: Two times a day (BID) | ORAL | 0 refills | Status: DC

## 2020-11-29 NOTE — Patient Instructions (Signed)

## 2020-11-29 NOTE — Progress Notes (Signed)
   Subjective:    Patient ID: Solon Augusta, female    DOB: 03-Sep-1985, 35 y.o.   MRN: [id number]  HPI  35 year old female presenting to [facility] for virtual appointment   Daughter had flu diagnosed 3 days ago- daughter also had negative COVID testing  Pt started with fever today & congestion denies any nausea or stomach upset.   Has taken tamiflu in the past without issues.    Consents to telehealth appointment  Review of Systems  Constitutional: Positive for chills, fatigue and fever.  HENT: Positive for congestion and sore throat.   Respiratory: Positive for cough.   Genitourinary: Negative.   Musculoskeletal: Negative.   Neurological: Negative.   Hematological: Negative.    Past Medical History:  Diagnosis Date  . Endometriosis   . GERD (gastroesophageal reflux disease)   . Missed ab   . Vaginal Pap smear, abnormal   . Wears glasses       Objective:   Physical Exam  No physical performed this was a telehealth appointment, patient was in no acute distress during phone conversation with provider       Assessment & Plan:  Will cover with Tamiflu due to active symptoms and close exposure. Take tamiflu with food.   Also recommended OTC cold/flu medication for support of symptoms  Push fluids and rest  RTC if symptoms persist or with new concerns   Meds ordered this encounter  Medications  . oseltamivir (TAMIFLU) 75 MG capsule    Sig: Take 1 capsule (75 mg total) by mouth 2 (two) times daily.    Dispense:  10 capsule    Refill:  0

## 2021-01-09 ENCOUNTER — Encounter: Payer: Self-pay | Admitting: Medical

## 2021-01-09 ENCOUNTER — Other Ambulatory Visit: Payer: Self-pay

## 2021-01-09 ENCOUNTER — Ambulatory Visit: Payer: BC Managed Care – PPO | Admitting: Medical

## 2021-01-09 VITALS — BP 123/85 | HR 75 | Temp 97.5°F | Resp 16 | Ht 62.0 in | Wt 165.0 lb

## 2021-01-09 DIAGNOSIS — M25562 Pain in left knee: Secondary | ICD-10-CM

## 2021-01-09 NOTE — Progress Notes (Signed)
   Subjective:    Patient ID: Kathryn Perry, female    DOB: [birth year], 35 y.o.   MRN: [id number]  HPI 35 yo female in non acute distress, presents with left knee pain x 5 days ago it started to pop, felt like it was going to give  out on her.  It has gotten worse. A full day of work yesterday, at the end the patinet could barely walk.  Prior injuries, at 35 yo sprained knee and again at  35 yo and then again at  35 yrs old.  Painful to bear weight by the end of the day.  Blood pressure 123/85, pulse 75, temperature (!) 97.5 F (36.4 C), temperature source Temporal, resp. rate 16, height 5\' 2"  (1.575 m), weight 165 lb (74.8 kg), SpO2 99 %, unknown if currently breastfeeding.  Allergies  Allergen Reactions   Nsaids Hives    Aleve (naproxen), Ibuprofen, ASA   Asa [Aspirin] Hives    Review of Systems  Musculoskeletal:  Positive for gait problem and joint swelling. Negative for back pain.  Skin:  Negative for color change.      Objective:   Physical Exam Constitutional:      Appearance: Normal appearance.  HENT:     Head: Normocephalic and atraumatic.  Eyes:     Extraocular Movements: Extraocular movements intact.     Conjunctiva/sclera: Conjunctivae normal.     Pupils: Pupils are equal, round, and reactive to light.  Pulmonary:     Effort: Pulmonary effort is normal.  Musculoskeletal:        General: Swelling present. No deformity or signs of injury. Normal range of motion.     Right lower leg: No edema.     Left lower leg: No edema.  Skin:    General: Skin is warm and dry.     Coloration: Skin is not pale.     Findings: No bruising, erythema, lesion or rash.  Neurological:     General: No focal deficit present.     Mental Status: She is alert and oriented to person, place, and time.  Psychiatric:        Mood and Affect: Mood normal.        Behavior: Behavior normal.        Thought Content: Thought content normal.        Judgment: Judgment normal.  Limping  when walking. Negative Drawers test. Some swelling, no erythema      Assessment & Plan:  Left knee pain  pain on anterior ligament  test Ace wrapped, Declines crutches.  Ice , Elevate, OTC Tylenol for pain per package instructions.. To follow up with [facility], given address and phone number. She verbalizes understanding and has no questions at discharge.

## 2021-03-27 NOTE — Patient Instructions (Signed)
Acute Knee Pain, Adult °Many things can cause knee pain. Sometimes, knee pain is sudden (acute) and may be caused by damage, swelling, or irritation of the muscles and tissues that support your knee. °The pain often goes away on its own with time and rest. If the pain does not go away, tests may be done to find out what is causing the pain. °Follow these instructions at home: °If you have a knee sleeve or brace: ° °Wear the knee sleeve or brace as told by your doctor. Take it off only as told by your doctor. °Loosen it if your toes: °Tingle. °Become numb. °Turn cold and blue. °Keep it clean. °If the knee sleeve or brace is not waterproof: °Do not let it get wet. °Cover it with a watertight covering when you take a bath or shower. °Activity °Rest your knee. °Do not do things that cause pain or make pain worse. °Avoid activities where both feet leave the ground at the same time (high-impact activities). Examples are running, jumping rope, and doing jumping jacks. °Work with a physical therapist to make a safe exercise program, as told by your doctor. °Managing pain, stiffness, and swelling ° °If told, put ice on the knee. To do this: °If you have a removable knee sleeve or brace, take it off as told by your doctor. °Put ice in a plastic bag. °Place a towel between your skin and the bag. °Leave the ice on for 20 minutes, 2-3 times a day. °Take off the ice if your skin turns bright red. This is very important. If you cannot feel pain, heat, or cold, you have a greater risk of damage to the area. °If told, use an elastic bandage to put pressure (compression) on your injured knee. °Raise your knee above the level of your heart while you are sitting or lying down. °Sleep with a pillow under your knee. °General instructions °Take over-the-counter and prescription medicines only as told by your doctor. °Do not smoke or use any products that contain nicotine or tobacco. If you need help quitting, ask your doctor. °If you are  overweight, work with your doctor and a food expert (dietitian) to set goals to lose weight. Being overweight can make your knee hurt more. °Watch for any changes in your symptoms. °Keep all follow-up visits. °Contact a doctor if: °The knee pain does not stop. °The knee pain changes or gets worse. °You have a fever along with knee pain. °Your knee is red or feels warm when you touch it. °Your knee gives out or locks up. °Get help right away if: °Your knee swells, and the swelling gets worse. °You cannot move your knee. °You have very bad knee pain that does not get better with pain medicine. °Summary °Many things can cause knee pain. The pain often goes away on its own with time and rest. °Your doctor may do tests to find out the cause of the pain. °Watch for any changes in your symptoms. Relieve your pain with rest, medicines, light activity, and use of ice. °Get help right away if you cannot move your knee or your knee pain is very bad. °This information is not intended to replace advice given to you by your health care provider. Make sure you discuss any questions you have with your health care provider. °Document Revised: 12/20/2019 Document Reviewed: 12/20/2019 °Elsevier Patient Education © 2022 [employer] ° °

## 2021-04-16 ENCOUNTER — Ambulatory Visit: Payer: BC Managed Care – PPO

## 2021-04-30 ENCOUNTER — Other Ambulatory Visit: Payer: Self-pay

## 2021-04-30 ENCOUNTER — Ambulatory Visit: Payer: Self-pay

## 2021-04-30 DIAGNOSIS — Z23 Encounter for immunization: Secondary | ICD-10-CM

## 2021-06-24 ENCOUNTER — Ambulatory Visit: Payer: Self-pay | Admitting: Medical

## 2021-06-24 ENCOUNTER — Encounter: Payer: Self-pay | Admitting: Medical

## 2021-06-24 ENCOUNTER — Other Ambulatory Visit: Payer: Self-pay

## 2021-06-24 VITALS — BP 128/80 | HR 96 | Temp 98.8°F | Resp 16

## 2021-06-24 DIAGNOSIS — J9801 Acute bronchospasm: Secondary | ICD-10-CM

## 2021-06-24 DIAGNOSIS — B9689 Other specified bacterial agents as the cause of diseases classified elsewhere: Secondary | ICD-10-CM

## 2021-06-24 DIAGNOSIS — J988 Other specified respiratory disorders: Secondary | ICD-10-CM

## 2021-06-24 DIAGNOSIS — R051 Acute cough: Secondary | ICD-10-CM

## 2021-06-24 MED ORDER — ALBUTEROL SULFATE (2.5 MG/3ML) 0.083% IN NEBU: 2.5000 mg | INHALATION_SOLUTION | Freq: Four times a day (QID) | RESPIRATORY_TRACT | 1 refills | Status: DC | PRN

## 2021-06-24 MED ORDER — AZITHROMYCIN 250 MG PO TABS: ORAL_TABLET | ORAL | 0 refills | Status: AC

## 2021-06-24 MED ORDER — BENZONATATE 100 MG PO CAPS: ORAL_CAPSULE | ORAL | 0 refills | Status: DC

## 2021-06-24 MED ORDER — ALBUTEROL SULFATE HFA 108 (90 BASE) MCG/ACT IN AERS: 2.0000 | INHALATION_SPRAY | Freq: Four times a day (QID) | RESPIRATORY_TRACT | 2 refills | Status: DC | PRN

## 2021-06-24 MED ORDER — PREDNISONE 10 MG (21) PO TBPK: ORAL_TABLET | ORAL | 0 refills | Status: AC

## 2021-06-24 NOTE — Progress Notes (Signed)
Subjective:    Patient ID: Kathryn Perry, female    DOB: Jun 17, 1986, 35 y.o.   MRN: [id number]  HPI 35 yo female in non acute distress Started with facial pressure, runny/ stuff nose , cough , denies fever or chills, no fatigue. Denies Shortness of breath , chest pain mid sternal and upper back , patient states it is due to her coughing so much.  Tried Delsym not working this time.   History of Bronchospasm after an illness.es usually uses inhaler, currently does not have an inhaler.   PCP [facility] Dr. Kary Kos.  before Today's Vitals   06/24/21 0940  BP: 128/80  Pulse: 96  Resp: 16  Temp: 98.8 F (37.1 C)  TempSrc: Tympanic  SpO2: 98%   There is no height or weight on file to calculate BMI.    Today's Vitals   06/24/21 0940  BP: 128/80  Pulse: 96  Resp: 16  Temp: 98.8 F (37.1 C)  TempSrc: Tympanic  SpO2: 98%      Review of Systems  Constitutional:  Negative for chills, fatigue and fever.  HENT:  Positive for congestion and postnasal drip. Negative for ear pain, rhinorrhea, sinus pressure, sinus pain, sneezing and sore throat.   Respiratory:  Positive for chest tightness ("a little"). Negative for shortness of breath and wheezing.   Cardiovascular:  Positive for chest pain ("soreness mid sternal and upper back).  Gastrointestinal:  Negative for abdominal pain, constipation, diarrhea, nausea and vomiting.  Genitourinary:  Negative for difficulty urinating.  Skin:  Negative for color change.  Allergic/Immunologic: Positive for environmental allergies. Negative for immunocompromised state.  Neurological: Negative.   Hematological: Negative.   Psychiatric/Behavioral: Negative.        Objective:   Physical Exam Vitals and nursing note reviewed.  Constitutional:      Appearance: Normal appearance.  HENT:     Head: Normocephalic and atraumatic.     Mouth/Throat:     Mouth: Mucous membranes are moist.     Pharynx: Oropharynx is clear.   Eyes:     Extraocular Movements: Extraocular movements intact.     Conjunctiva/sclera: Conjunctivae normal.     Pupils: Pupils are equal, round, and reactive to light.  Cardiovascular:     Rate and Rhythm: Normal rate and regular rhythm.     Heart sounds: Normal heart sounds. No murmur heard.   No friction rub. No gallop.  Pulmonary:     Effort: Pulmonary effort is normal.     Comments: Decsreased breath sounds on the right side. Musculoskeletal:        General: Normal range of motion.     Cervical back: Normal range of motion.  Skin:    General: Skin is warm and dry.  Neurological:     General: No focal deficit present.     Mental Status: She is alert and oriented to person, place, and time.  Psychiatric:        Mood and Affect: Mood normal.        Behavior: Behavior normal.        Thought Content: Thought content normal.        Judgment: Judgment normal.      Coughing in room   Nebulizer treatment with Albuterol given to patient. Tolerated well, decrease in cough noted at discharge. Assessment & Plan:  Cough, Bronchospasm Bacterial respiratory symptom  Meds ordered this encounter  Medications   albuterol (PROVENTIL) (2.5 MG/3ML) 0.083% nebulizer solution    Sig: Take 3  mLs (2.5 mg total) by nebulization every 6 (six) hours as needed for wheezing or shortness of breath.    Dispense:  150 mL    Refill:  1   benzonatate (TESSALON PERLES) 100 MG capsule    Sig: Take one to two capsules by mouth every 8 hours as needed for cough    Dispense:  30 capsule    Refill:  0   predniSONE (STERAPRED UNI-PAK 21 TAB) 10 MG (21) TBPK tablet    Sig: Take 6 tablets by mouth today then  5 tablets tomorrow, then one less tablet every day thereafter.    Dispense:  21 tablet    Refill:  0   azithromycin (ZITHROMAX) 250 MG tablet    Sig: Take 2 tablets on day 1, then 1 tablet daily on days 2 through 5    Dispense:  6 tablet    Refill:  0   albuterol (VENTOLIN HFA) 108 (90 Base)  MCG/ACT inhaler    Sig: Inhale 2 puffs into the lungs every 6 (six) hours as needed for wheezing or shortness of breath.    Dispense:  8 g    Refill:  2     Coughing improved at discharge. Return in 3-5 days if not improving.reviewed medications with patient. Patient verbalizes understanding and has no questions at discharge.

## 2021-06-24 NOTE — Patient Instructions (Signed)
Upper Respiratory Infection, Adult An upper respiratory infection (URI) affects the nose, throat, and upper airways that lead to the lungs. The most common type of URI is often called the common cold. URIs usually get better on their own, without medical treatment. What are the causes? A URI is caused by a germ (virus). You may catch these germs by: Breathing in droplets from an infected person's cough or sneeze. Touching something that has the germ on it (is contaminated) and then touching your mouth, nose, or eyes. What increases the risk? You are more likely to get a URI if: You are very young or very old. You have close contact with others, such as at work, school, or a health care facility. You smoke. You have long-term (chronic) heart or lung disease. You have a weakened disease-fighting system (immune system). You have nasal allergies or asthma. You have a lot of stress. You have poor nutrition. What are the signs or symptoms? Runny or stuffy (congested) nose. Cough. Sneezing. Sore throat. Headache. Feeling tired (fatigue). Fever. Not wanting to eat as much as usual. Pain in your forehead, behind your eyes, and over your cheekbones (sinus pain). Muscle aches. Redness or irritation of the eyes. Pressure in the ears or face. How is this treated? URIs usually get better on their own within 7-10 days. Medicines cannot cure URIs, but your doctor may recommend certain medicines to help relieve symptoms, such as: Over-the-counter cold medicines. Medicines to reduce coughing (cough suppressants). Coughing is a type of defense against infection that helps to clear the nose, throat, windpipe, and lungs (respiratory system). Take these medicines only as told by your doctor. Medicines to lower your fever. Follow these instructions at home: Activity Rest as needed. If you have a fever, stay home from work or school until your fever is gone, or until your doctor says you may return to  work or school. You should stay home until you cannot spread the infection anymore (you are not contagious). Your doctor may have you wear a face mask so you have less risk of spreading the infection. Relieving symptoms Rinse your mouth often with salt water. To make salt water, dissolve -1 tsp (3-6 g) of salt in 1 cup (237 mL) of warm water. Use a cool-mist humidifier to add moisture to the air. This can help you breathe more easily. Eating and drinking  Drink enough fluid to keep your pee (urine) pale yellow. Eat soups and other clear broths. General instructions  Take over-the-counter and prescription medicines only as told by your doctor. Do not smoke or use any products that contain nicotine or tobacco. If you need help quitting, ask your doctor. Avoid being where people are smoking (avoid secondhand smoke). Stay up to date on all your shots (immunizations), and get the flu shot every year. Keep all follow-up visits. How to prevent the spread of infection to others  Wash your hands with soap and water for at least 20 seconds. If you cannot use soap and water, use hand sanitizer. Avoid touching your mouth, face, eyes, or nose. Cough or sneeze into a tissue or your sleeve or elbow. Do not cough or sneeze into your hand or into the air. Contact a doctor if: You are getting worse, not better. You have any of these: A fever or chills. [place] or red mucus in your nose. Yellow or [place] fluid (discharge)coming from your nose. Pain in your face, especially when you bend forward. Swollen neck glands. Pain when you swallow.   White areas in the back of your throat. Get help right away if: You have shortness of breath that gets worse. You have very bad or constant: Headache. Ear pain. Pain in your forehead, behind your eyes, and over your cheekbones (sinus pain). Chest pain. You have long-lasting (chronic) lung disease along with any of these: Making high-pitched whistling sounds when  you breathe, most often when you breathe out (wheezing). Long-lasting cough (more than 14 days). Coughing up blood. A change in your usual mucus. You have a stiff neck. You have changes in your: Vision. Hearing. Thinking. Mood. These symptoms may be an emergency. Get help right away. Call 911. Do not wait to see if the symptoms will go away. Do not drive yourself to the hospital. Summary An upper respiratory infection (URI) is caused by a germ (virus). The most common type of URI is often called the common cold. URIs usually get better within 7-10 days. Take over-the-counter and prescription medicines only as told by your doctor. This information is not intended to replace advice given to you by your health care provider. Make sure you discuss any questions you have with your health care provider. Document Revised: 02/05/2021 Document Reviewed: 02/05/2021 Elsevier Patient Education  [place]. Cough, Adult A cough helps to clear your throat and lungs. A cough may be a sign of an illness or another medical condition. An acute cough may only last 2-3 weeks, while a chronic cough may last 8 or more weeks. Many things can cause a cough. They include: Germs (viruses or bacteria) that attack the airway. Breathing in things that bother (irritate) your lungs. Allergies. Asthma. Mucus that runs down the back of your throat (postnasal drip). Smoking. Acid backing up from the stomach into the tube that moves food from the mouth to the stomach (gastroesophageal reflux). Some medicines. Lung problems. Other medical conditions, such as heart failure or a blood clot in the lung (pulmonary embolism). Follow these instructions at home: Medicines Take over-the-counter and prescription medicines only as told by your doctor. Talk with your doctor before you take medicines that stop a cough (cough suppressants). Lifestyle  Do not smoke, and try not to be around smoke. Do not use any  products that contain nicotine or tobacco, such as cigarettes, e-cigarettes, and chewing tobacco. If you need help quitting, ask your doctor. Drink enough fluid to keep your pee (urine) pale yellow. Avoid caffeine. Do not drink alcohol if your doctor tells you not to drink. General instructions  Watch for any changes in your cough. Tell your doctor about them. Always cover your mouth when you cough. Stay away from things that make you cough, such as perfume, candles, campfire smoke, or cleaning products. If the air is dry, use a cool mist vaporizer or humidifier in your home. If your cough is worse at night, try using extra pillows to raise your head up higher while you sleep. Rest as needed. Keep all follow-up visits as told by your doctor. This is important. Contact a doctor if: You have new symptoms. You cough up pus. Your cough does not get better after 2-3 weeks, or your cough gets worse. Cough medicine does not help your cough and you are not sleeping well. You have pain that gets worse or pain that is not helped with medicine. You have a fever. You are losing weight and you do not know why. You have night sweats. Get help right away if: You cough up blood. You have trouble breathing.  Your heartbeat is very fast. These symptoms may be an emergency. Do not wait to see if the symptoms will go away. Get medical help right away. Call your local emergency services (911 in the [place] Do not drive yourself to the hospital. Summary A cough helps to clear your throat and lungs. Many things can cause a cough. Take over-the-counter and prescription medicines only as told by your doctor. Always cover your mouth when you cough. Contact a doctor if you have new symptoms or you have a cough that does not get better or gets worse. This information is not intended to replace advice given to you by your health care provider. Make sure you discuss any questions you have with your health care  provider. Document Revised: 08/25/2019 Document Reviewed: 07/25/2018 Elsevier Patient Education  [place].

## 2021-08-26 ENCOUNTER — Other Ambulatory Visit: Payer: Self-pay

## 2021-08-26 ENCOUNTER — Encounter: Payer: Self-pay | Admitting: Medical

## 2021-08-26 ENCOUNTER — Ambulatory Visit
Admission: RE | Admit: 2021-08-26 | Discharge: 2021-08-26 | Disposition: A | Discharge status: Home / Self Care | Payer: BC Managed Care – PPO | Attending: Medical | Admitting: Medical

## 2021-08-26 ENCOUNTER — Ambulatory Visit
Admission: RE | Admit: 2021-08-26 | Discharge: 2021-08-26 | Disposition: A | Discharge status: Home / Self Care | Payer: BC Managed Care – PPO | Source: Ambulatory Visit | Attending: Medical | Admitting: Medical

## 2021-08-26 ENCOUNTER — Ambulatory Visit: Payer: Self-pay | Admitting: Medical

## 2021-08-26 VITALS — BP 130/88 | HR 97 | Temp 99.7°F | Resp 18

## 2021-08-26 DIAGNOSIS — R509 Fever, unspecified: Secondary | ICD-10-CM

## 2021-08-26 DIAGNOSIS — R051 Acute cough: Secondary | ICD-10-CM

## 2021-08-26 DIAGNOSIS — Z20822 Contact with and (suspected) exposure to covid-19: Secondary | ICD-10-CM

## 2021-08-26 DIAGNOSIS — J9801 Acute bronchospasm: Secondary | ICD-10-CM

## 2021-08-26 DIAGNOSIS — R6889 Other general symptoms and signs: Secondary | ICD-10-CM

## 2021-08-26 LAB — POCT INFLUENZA A/B
Influenza A, POC: NEGATIVE
Influenza B, POC: NEGATIVE

## 2021-08-26 LAB — POC COVID19 BINAXNOW: SARS Coronavirus 2 Ag: NEGATIVE

## 2021-08-26 MED ORDER — ALBUTEROL SULFATE HFA 108 (90 BASE) MCG/ACT IN AERS: 2.0000 | INHALATION_SPRAY | Freq: Four times a day (QID) | RESPIRATORY_TRACT | 0 refills | Status: DC | PRN

## 2021-08-26 MED ORDER — BENZONATATE 100 MG PO CAPS: 100.0000 mg | ORAL_CAPSULE | Freq: Three times a day (TID) | ORAL | 0 refills | Status: AC | PRN

## 2021-08-26 NOTE — Progress Notes (Signed)
Subjective:    Patient ID: Kathryn Perry, female    DOB: [birth year], 36 y.o.   MRN: [id number]  HPI 36 yo female in non acute distress. Thursday with symptoms and fever 102 on Sunday. Clear nasal discharge, cough is clear.  Tylenol for fever, Used Mucinex DM but felt it made her cough more.   Vaccinated  2 boosters. Review of Systems  Constitutional:  Positive for chills, fatigue and fever.  HENT:  Positive for congestion, postnasal drip, rhinorrhea, sinus pressure, sinus pain and sneezing. Negative for ear pain, sore throat (just from coughing) and voice change.   Eyes:  Negative for discharge and itching.  Respiratory:  Positive for cough. Negative for shortness of breath.   Gastrointestinal:  Negative for abdominal pain, diarrhea, nausea and vomiting.  Genitourinary:  Negative for difficulty urinating.  Musculoskeletal:  Positive for myalgias.  Skin:  Negative for color change and rash.  Allergic/Immunologic: Positive for environmental allergies. Negative for food allergies.  Neurological:  Positive for headaches. Negative for dizziness, syncope and light-headedness.      Objective:   Physical Exam Vitals and nursing note reviewed.  Constitutional:      Appearance: Normal appearance.  HENT:     Head: Normocephalic and atraumatic.     Right Ear: Ear canal and external ear normal.     Left Ear: Ear canal and external ear normal.     Nose: Congestion present.     Mouth/Throat:     Mouth: Mucous membranes are moist.     Pharynx: No oropharyngeal exudate or posterior oropharyngeal erythema.     Comments: Uvula swollen most likely due to coughing Eyes:     Extraocular Movements: Extraocular movements intact.     Conjunctiva/sclera: Conjunctivae normal.     Pupils: Pupils are equal, round, and reactive to light.  Cardiovascular:     Rate and Rhythm: Normal rate and regular rhythm.     Heart sounds: Normal heart sounds.  Pulmonary:     Effort: Pulmonary effort is  normal.     Breath sounds: Normal breath sounds.  Musculoskeletal:        General: Normal range of motion.  Skin:    General: Skin is warm.  Neurological:     General: No focal deficit present.     Mental Status: She is alert and oriented to person, place, and time. Mental status is at baseline.  Psychiatric:        Mood and Affect: Mood normal.        Behavior: Behavior normal.        Thought Content: Thought content normal.        Judgment: Judgment normal.      Results for orders placed or performed in visit on 08/26/21 (from the past 24 hour(s))  POC COVID-19     Status: Normal   Collection Time: 08/26/21 11:02 AM  Result Value Ref Range   SARS Coronavirus 2 Ag Negative Negative  POCT Influenza A/B     Status: Normal   Collection Time: 08/26/21 11:02 AM  Result Value Ref Range   Influenza A, POC Negative Negative   Influenza B, POC Negative Negative       Assessment & Plan:  Encounter for screening for  Covid-19 infection Cough, Bronchospasm Nasal congestion Covid-19  PCR CXR Pending. Continue OTC Zyrtec, Starat Afrin NS x 4 days only. Tylenol as needed. Meds ordered this encounter  Medications   benzonatate (TESSALON PERLES) 100 MG capsule  Sig: Take 1 capsule (100 mg total) by mouth 3 (three) times daily as needed.    Dispense:  30 capsule    Refill:  0   albuterol (VENTOLIN HFA) 108 (90 Base) MCG/ACT inhaler    Sig: Inhale 2 puffs into the lungs every 6 (six) hours as needed for wheezing or shortness of breath.    Dispense:  8 g    Refill:  0   Will contact patient with x-ray results. Rest, increase fluids. Work note provided. Patient verbalizes understanding and has no questions at discharge.Marland Kitchen

## 2021-08-26 NOTE — Patient Instructions (Signed)

## 2021-08-27 ENCOUNTER — Ambulatory Visit: Payer: Self-pay | Admitting: Medical

## 2021-08-27 DIAGNOSIS — J189 Pneumonia, unspecified organism: Secondary | ICD-10-CM

## 2021-08-27 LAB — NOVEL CORONAVIRUS, NAA: SARS-CoV-2, NAA: NOT DETECTED

## 2021-08-27 LAB — SARS-COV-2, NAA 2 DAY TAT

## 2021-08-27 MED ORDER — AZITHROMYCIN 250 MG PO TABS: ORAL_TABLET | ORAL | 0 refills | Status: AC

## 2021-08-27 MED ORDER — AMOXICILLIN 500 MG PO CAPS: 1000.0000 mg | ORAL_CAPSULE | Freq: Three times a day (TID) | ORAL | 0 refills | Status: AC

## 2021-08-27 NOTE — Progress Notes (Signed)
Patient ID: Kathryn Perry, female   DOB: [birth year], 36 y.o.   MRN: [id number]   Called patient , reviewed x-ray sent antibiotic to her pharmacy. Recheck on Friday at [place] with provider. Meds ordered this encounter  Medications   amoxicillin (AMOXIL) 500 MG capsule    Sig: Take 2 capsules (1,000 mg total) by mouth 3 (three) times daily for 7 days.    Dispense:  42 capsule    Refill:  0   azithromycin (ZITHROMAX) 250 MG tablet    Sig: Take 2 tablets day 1 then 1 tablet days 2-5, take with food.    Dispense:  6 each    Refill:  0    CLINICAL DATA:  Cough and fever.  Congestion.   EXAM: CHEST - 2 VIEW   COMPARISON:  November 10, 2019   FINDINGS: New infiltrate in the right middle lobe. The lungs are otherwise clear. No pneumothorax. The cardiomediastinal silhouette is normal.   IMPRESSION: Right middle lobe infiltrate consistent with pneumonia given history. Recommend short-term follow-up imaging after treatment to ensure resolution.  Return in  7-10 days for a repeat chest x-ray.

## 2021-08-28 ENCOUNTER — Telehealth: Payer: Self-pay | Admitting: Nurse Practitioner

## 2021-08-28 NOTE — Telephone Encounter (Signed)
Spoke with patient she is feeling a little better, she has started the antibiotics and has had some diarrhea. She is also using her inhaler. Scheduled for follow up tomorrow.   Advised to start probiotic for antibiotic associated pneumonia.

## 2021-08-29 ENCOUNTER — Ambulatory Visit: Payer: Self-pay | Admitting: Nurse Practitioner

## 2021-08-29 ENCOUNTER — Encounter: Payer: Self-pay | Admitting: Nurse Practitioner

## 2021-08-29 ENCOUNTER — Other Ambulatory Visit: Payer: Self-pay

## 2021-08-29 VITALS — HR 88 | Temp 97.5°F | Resp 18

## 2021-08-29 DIAGNOSIS — J189 Pneumonia, unspecified organism: Secondary | ICD-10-CM

## 2021-08-29 NOTE — Progress Notes (Signed)
° °  Subjective:    Patient ID: Kathryn Perry, female    DOB: 01/20/1986, 36 y.o.   MRN: [id number]  HPI  36 year old female returning to [facility] for a follow up visit regarding pneumonia diagnosis this week.   She has continued her Augmentin, feels that sinus congestion has improved Using Albuterol inhaler every 6 hours  Continues Azithromycin as well  She is using Mucinex OTC    Today's Vitals   08/29/21 0927  Pulse: 88  Resp: 18  Temp: (!) 97.5 F (36.4 C)  SpO2: 98%   There is no height or weight on file to calculate BMI.   Review of Systems  Constitutional:  Positive for fatigue.  HENT:  Positive for congestion.   Respiratory:  Positive for cough and wheezing.   Cardiovascular: Negative.   Gastrointestinal: Negative.   Genitourinary: Negative.   Musculoskeletal: Negative.   Neurological: Negative.   Hematological: Negative.   Psychiatric/Behavioral: Negative.        Objective:   Physical Exam Constitutional:      Appearance: Normal appearance.  HENT:     Head: Normocephalic.     Nose: Nose normal.  Eyes:     Pupils: Pupils are equal, round, and reactive to light.  Cardiovascular:     Rate and Rhythm: Normal rate and regular rhythm.     Heart sounds: Normal heart sounds.  Pulmonary:     Effort: Pulmonary effort is normal.     Breath sounds: Examination of the right-upper field reveals wheezing. Examination of the right-middle field reveals decreased breath sounds, wheezing and rales. Examination of the right-lower field reveals decreased breath sounds. Decreased breath sounds, wheezing and rales present.     Comments: Expiratory wheezing, course cough  Musculoskeletal:     Cervical back: Normal range of motion.  Skin:    General: Skin is warm.  Neurological:     General: No focal deficit present.     Mental Status: She is alert.  Psychiatric:        Mood and Affect: Mood normal.          Assessment & Plan:   1.  Pneumonia of right lung due to infectious organism, unspecified part of lung  - DG Chest 2 View; Future goal for 2/20 earlier if not improving  Continue regimen, RTC if symptoms worsen or with new concerns.  Will follow up with chest Xray results when available

## 2021-08-31 ENCOUNTER — Encounter: Payer: Self-pay | Admitting: Nurse Practitioner

## 2021-08-31 DIAGNOSIS — B379 Candidiasis, unspecified: Secondary | ICD-10-CM

## 2021-08-31 DIAGNOSIS — T3695XA Adverse effect of unspecified systemic antibiotic, initial encounter: Secondary | ICD-10-CM

## 2021-09-01 MED ORDER — FLUCONAZOLE 150 MG PO TABS: 150.0000 mg | ORAL_TABLET | Freq: Once | ORAL | 0 refills | Status: AC

## 2021-09-09 ENCOUNTER — Ambulatory Visit
Admission: RE | Admit: 2021-09-09 | Discharge: 2021-09-09 | Disposition: A | Discharge status: Home / Self Care | Payer: BC Managed Care – PPO | Source: Ambulatory Visit | Attending: Nurse Practitioner | Admitting: Nurse Practitioner

## 2021-09-09 ENCOUNTER — Ambulatory Visit
Admission: RE | Admit: 2021-09-09 | Discharge: 2021-09-09 | Disposition: A | Discharge status: Home / Self Care | Payer: BC Managed Care – PPO | Attending: Nurse Practitioner | Admitting: Nurse Practitioner

## 2021-09-09 DIAGNOSIS — J189 Pneumonia, unspecified organism: Secondary | ICD-10-CM

## 2021-09-10 ENCOUNTER — Encounter: Payer: Self-pay | Admitting: Nurse Practitioner

## 2021-09-17 ENCOUNTER — Other Ambulatory Visit: Payer: Self-pay | Admitting: Medical

## 2021-09-17 DIAGNOSIS — J9801 Acute bronchospasm: Secondary | ICD-10-CM

## 2021-09-28 IMAGING — US US MFM OB FOLLOW-UP
1 series · 15 of 28 positions shown · non-contrast
Comparison: none

[Series 1: us mfm ob follow-up · 87 acquisitions, 15 frames shown]
[im 1/87]
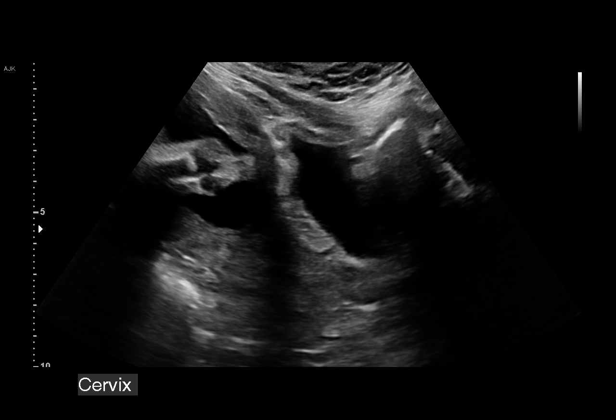
[im 7/87]
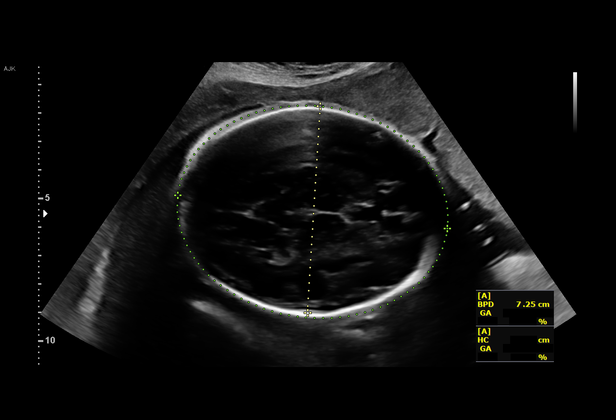
[im 13/87]
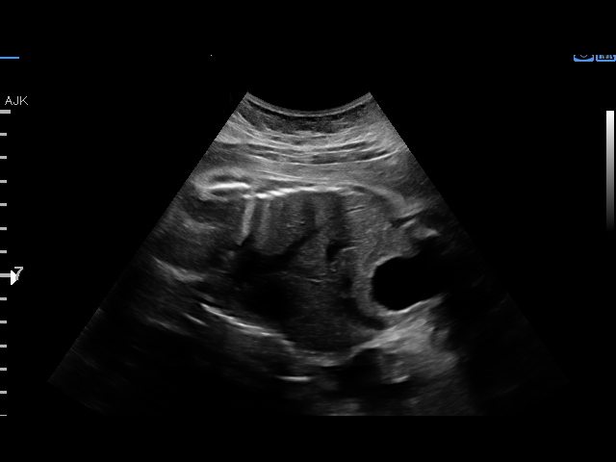
[im 20/87]
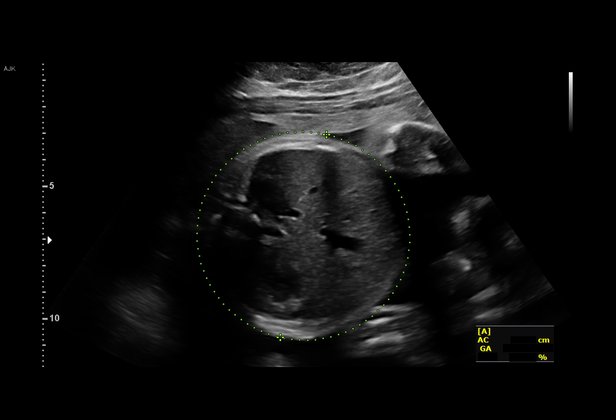
[im 26/87]
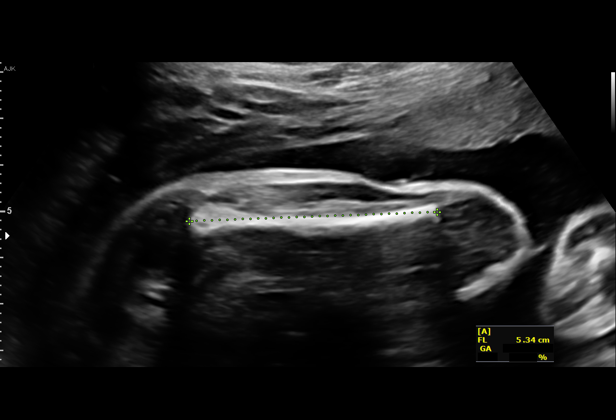
[im 32/87]
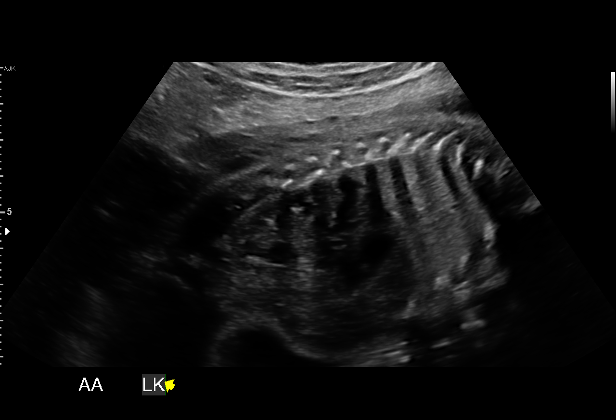
[im 39/87]
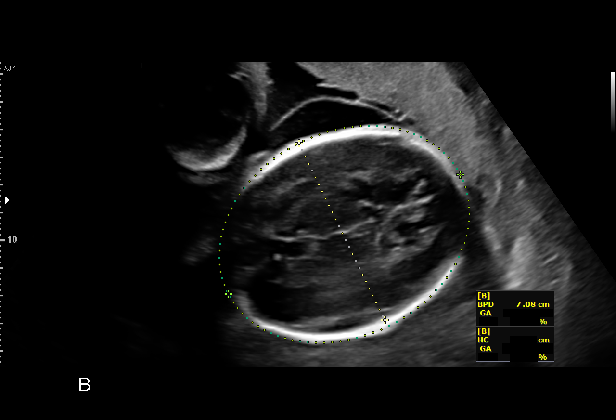
[im 45/87]
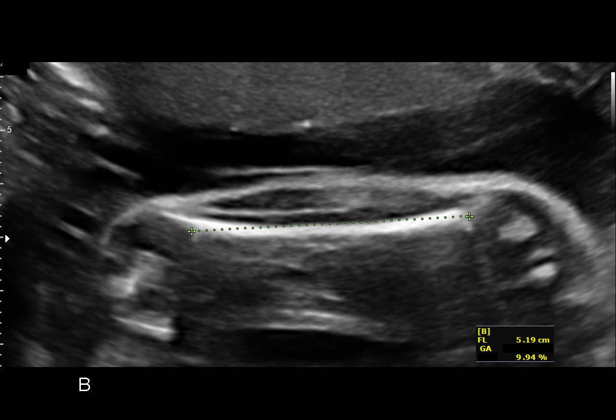
[im 48/87]
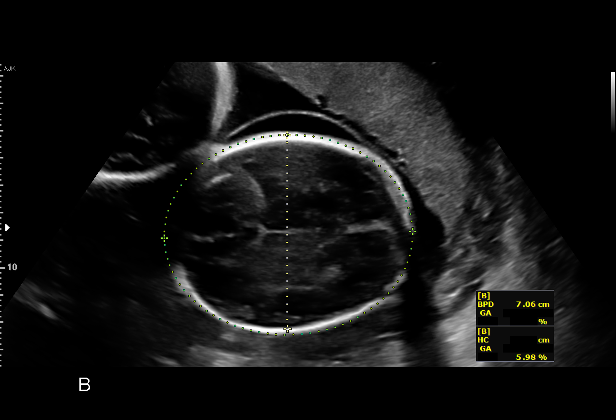
[im 55/87]
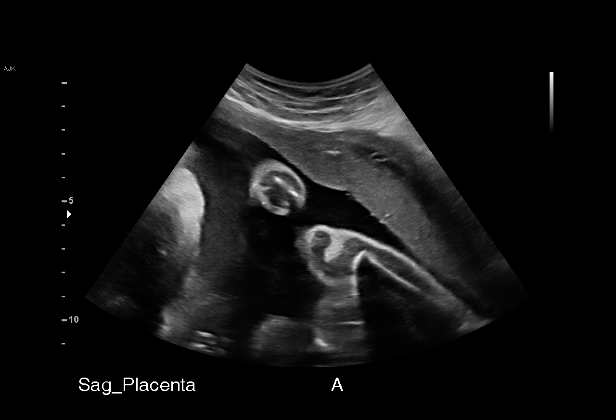
[im 61/87]
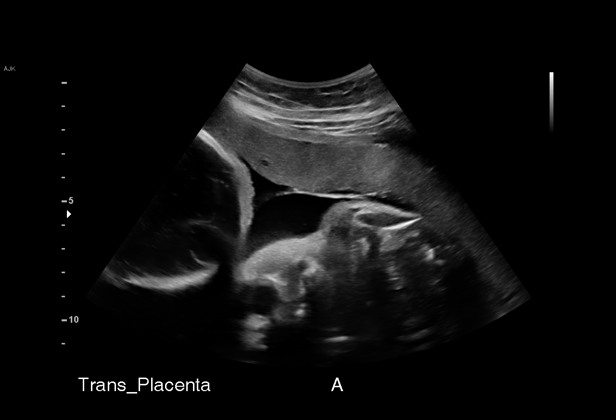
[im 67/87]
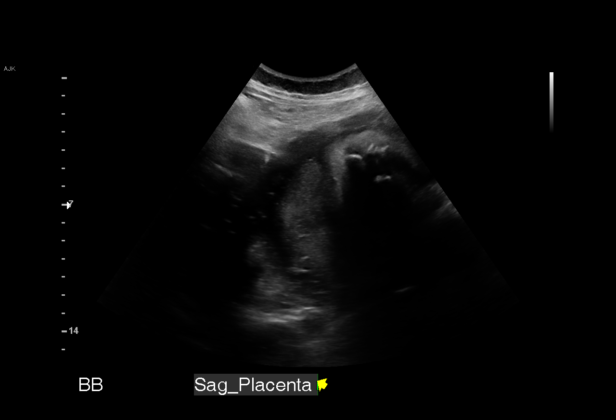
[im 74/87]
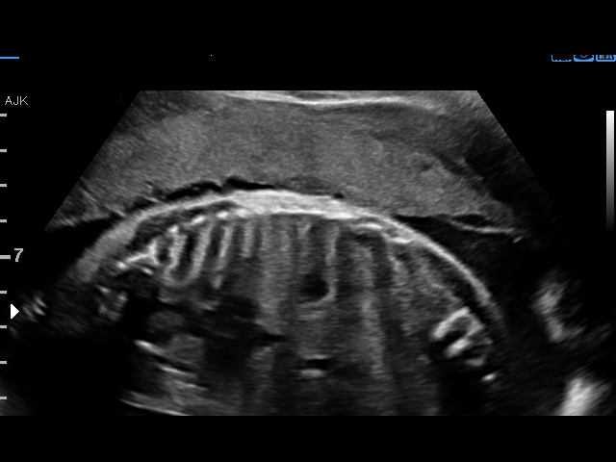
[im 80/87]
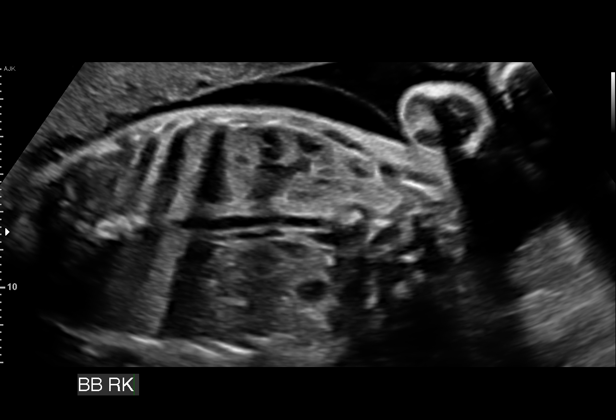
[im 87/87]
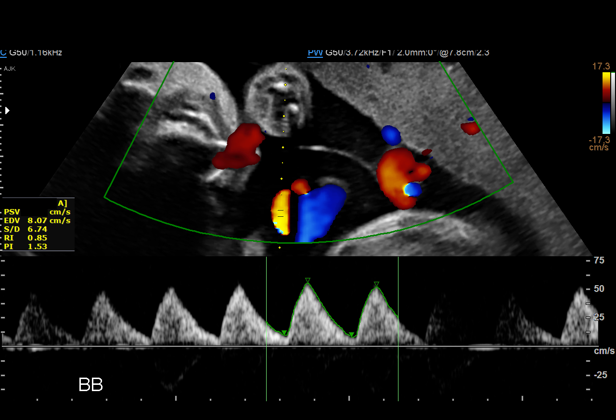

[15 of 28 positions shown; findings below may reference images not displayed]

OB

                                                       SZISZE
     TORJAI                                              SZISZE
 ----------------------------------------------------------------------

 ----------------------------------------------------------------------
Indications

  28 weeks gestation of pregnancy
  History of cesarean delivery, currently
  pregnant
  Previous cervical surgery LEEP
  Low risk NIPS, NL fetal echo x2
  Encounter for other antenatal screening
  follow-up
  Twin pregnancy, di/di, third trimester
  Advanced maternal age multigravida 35+,
  third trimester
 ----------------------------------------------------------------------
Vital Signs

                                                Height:        5'0"
Fetal Evaluation (Fetus A)

 Num Of Fetuses:         2
 Fetal Heart Rate(bpm):  146
 Cardiac Activity:       Observed
 Fetal Lie:              Maternal right side
 Presentation:           Breech
 Placenta:               Anterior
 P. Cord Insertion:      Visualized, central
 Membrane Desc:      Dividing Membrane seen - Dichorionic.
 Amniotic Fluid
 AFI FV:      Within normal limits

                             Largest Pocket(cm)

Biometry (Fetus A)

 BPD:      72.1  mm     G. Age:  29w 0d         40  %    CI:        73.08   %    70 - 86
                                                         FL/HC:      19.7   %    19.6 -
 HC:      268.1  mm     G. Age:  29w 2d         27  %    HC/AC:      1.06        0.99 -
 AC:      252.3  mm     G. Age:  29w 3d         61  %    FL/BPD:     73.1   %    71 - 87
 FL:       52.7  mm     G. Age:  28w 0d         15  %    FL/AC:      20.9   %    20 - 24
 HUM:      48.6  mm     G. Age:  28w 4d         37  %

 LV:          3  mm

 Est. FW:    5498  gm    2 lb 14 oz      39  %     FW Discordancy     0 \ 11 %
OB History

 Gravidity:    3         Term:   1         SAB:   1
 Living:       1
Gestational Age (Fetus A)

 LMP:           28w 6d        Date:  03/25/19                 EDD:   12/30/19
 U/S Today:     29w 0d                                        EDD:   12/29/19
 Best:          28w 6d     Det. By:  LMP  (03/25/19)          EDD:   12/30/19
Anatomy (Fetus A)

 Cranium:               Appears normal         Aortic Arch:            Previously seen
 Cavum:                 Appears normal         Ductal Arch:            Previously seen
 Ventricles:            Appears normal         Diaphragm:              Appears normal
 Choroid Plexus:        Previously seen        Stomach:                Appears normal, left
                                                                       sided
 Cerebellum:            Previously seen        Abdomen:                Appears normal
 Posterior Fossa:       Previously seen        Abdominal Wall:         Previously seen
 Nuchal Fold:           Previously seen        Cord Vessels:           Previously seen
 Face:                  Orbits and profile     Kidneys:                Appear normal
                        previously seen
 Lips:                  Previously seen        Bladder:                Appears normal
 Thoracic:              Appears normal         Spine:                  Previously seen
 Heart:                 Previously seen        Upper Extremities:      Previously seen
 RVOT:                  Previously seen        Lower Extremities:      Previously seen
 LVOT:                  Previously seen

 Other:  Female gender Heels and 5th digit visualized previously.
Doppler - Fetal Vessels (Fetus A)

 Umbilical Artery
  S/D     %tile     RI              PI                     ADFV    RDFV
 3.46       76   0.71             1.18                        No      No

Fetal Evaluation (Fetus B)
 Num Of Fetuses:         2
 Fetal Heart Rate(bpm):  137
 Cardiac Activity:       Observed
 Fetal Lie:              Maternal left side
 Presentation:           Breech
 Placenta:               Posterior
 P. Cord Insertion:      Previously Visualized
 Membrane Desc:      Dividing Membrane seen - Dichorionic.

 Amniotic Fluid
 AFI FV:      Within normal limits

                             Largest Pocket(cm)

Biometry (Fetus B)

 BPD:      70.7  mm     G. Age:  28w 3d         24  %    CI:        72.98   %    70 - 86
                                                         FL/HC:      19.8   %    19.6 -
 HC:      263.1  mm     G. Age:  28w 4d         13  %    HC/AC:      1.11        0.99 -
 AC:      237.4  mm     G. Age:  28w 0d         20  %    FL/BPD:     73.7   %    71 - 87
 FL:       52.1  mm     G. Age:  27w 5d         11  %    FL/AC:      21.9   %    20 - 24

 Est. FW:    8896  gm      2 lb 9 oz     14  %     FW Discordancy        11  %
Gestational Age (Fetus B)

 LMP:           28w 6d        Date:  03/25/19                 EDD:   12/30/19
 U/S Today:     28w 1d                                        EDD:   01/04/20
 Best:          28w 6d     Det. By:  LMP  (03/25/19)          EDD:   12/30/19
Anatomy (Fetus B)

 Cranium:               Appears normal         Aortic Arch:            Previously seen
 Cavum:                 Appears normal         Ductal Arch:            Previously seen
 Ventricles:            Previously seen        Diaphragm:              Appears normal
 Choroid Plexus:        Previously seen        Stomach:                Appears normal, left
                                                                       sided
 Cerebellum:            Previously seen        Abdomen:                Appears normal
 Posterior Fossa:       Previously seen        Abdominal Wall:         Previously seen
 Nuchal Fold:           Previously seen        Cord Vessels:           Previously seen
 Face:                  Orbits and profile     Kidneys:                Appear normal
                        previously seen
 Lips:                  Previously seen        Bladder:                Appears normal
 Thoracic:              Appears normal         Spine:                  Previously seen
 Heart:                 Previously seen        Upper Extremities:      Previously seen
 RVOT:                  Previously seen        Lower Extremities:      Previously seen
 LVOT:                  Previously seen

 Other:  Female gender Heels visualized previously. Technically difficult due
         to fetal position.
Doppler - Fetal Vessels (Fetus B)

 Umbilical Artery
  S/D     %tile     RI              PI                     ADFV    RDFV
 5.48    > 97.5  0.82             1.41                        No      No
Cervix Uterus Adnexa

 Cervix
 Length:           3.11  cm.
 Normal appearance by transabdominal scan.

 Uterus
 No abnormality visualized.

 Left Ovary
 Not visualized.

 Right Ovary
 Not visualized.

 Adnexa
 No abnormality visualized.
Impression

 Dichorionic-diamniotic twin pregnancy.
 Twin A: Maternal right, breech, anterior placenta. Fetal
 growth is appropriate for gestational age. Amniotic fluid is
 normal and good fetal activity is seen. Umbilical artery
 Doppler showed normal forward diastolic flow. No evidence of
 fetal growth restriction.
 Twin B: Maternal left, breech, posterior placenta. Fetal growth
 is appropriate for gestational age. Amniotic fluid is normal
 and good fetal activity is seen. Umbilical artery Doppler
 showed normal forward diastolic flow. No evidence of fetal
 growth restriction.
 Growth discordancy: 11% (normal).
 Patient was evaluated at your office today for suspected
 preterm labor. She c/o brownish discharge. Cervix was long
 and closed. FFN was taken.
 I explained the significance of fetal fibronectin (FFN)
 screening. It can be positive with blood and should be taken
 as predictive of preterm delivery.
 Twin pregnancy is associated with increased risk of preterm
 delivery. She had one previous term cesarean delivery.
Recommendations

 -An appointment was made for her to return in 4 weeks for
 fetal growth assessment.
                 Redkin, Bekmyrza

## 2021-10-03 IMAGING — US US MFM OB LIMITED
1 series · 14 of 25 positions shown · non-contrast
Comparison: none

[Series 1: us mfm ob limited · 14 of 25 slices shown]
[im 1/25]
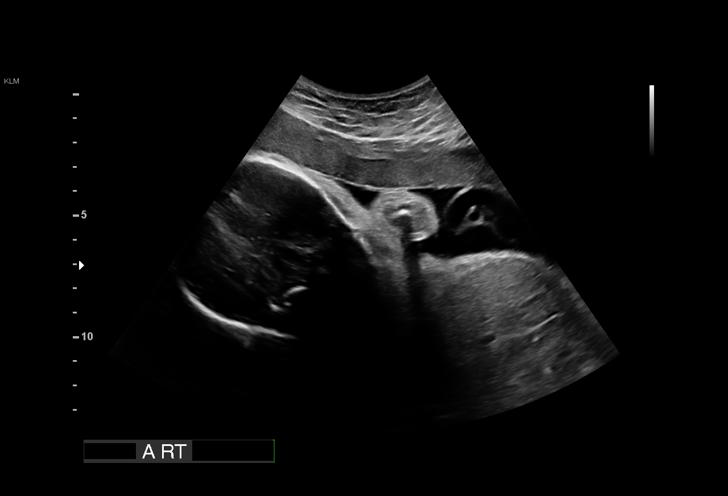
[im 3/25]
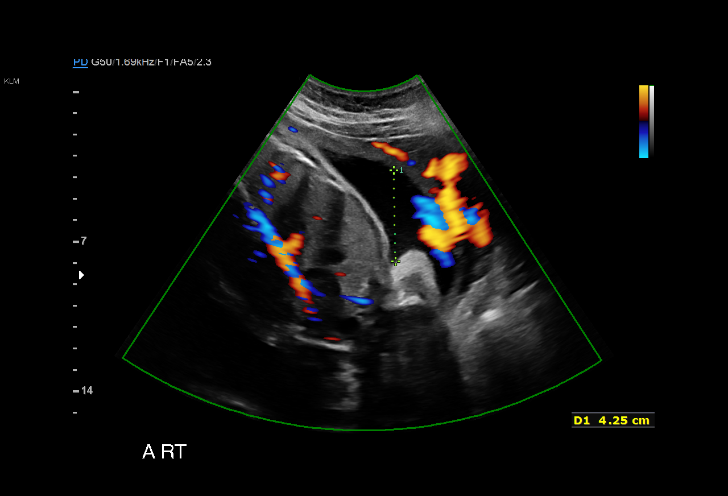
[im 5/25]
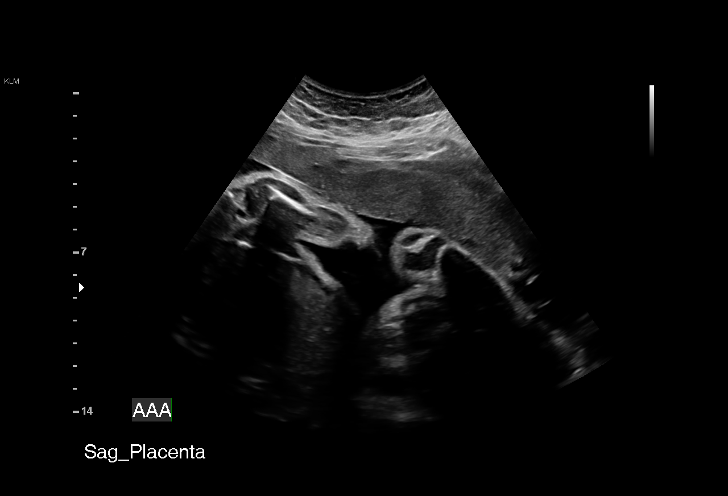
[im 7/25]
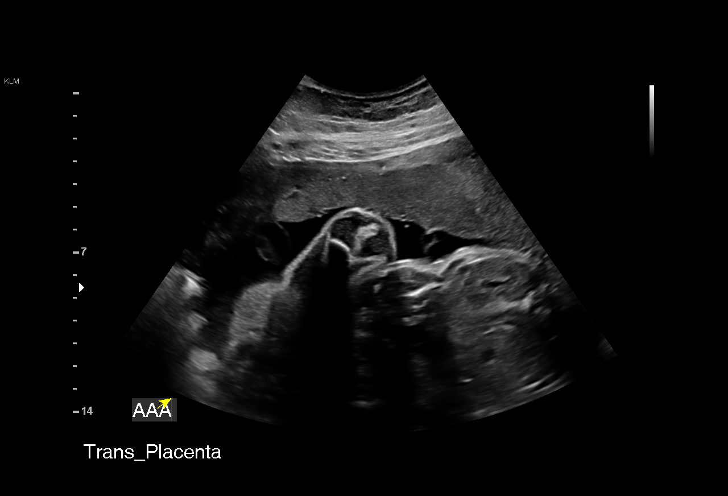
[im 9/25]
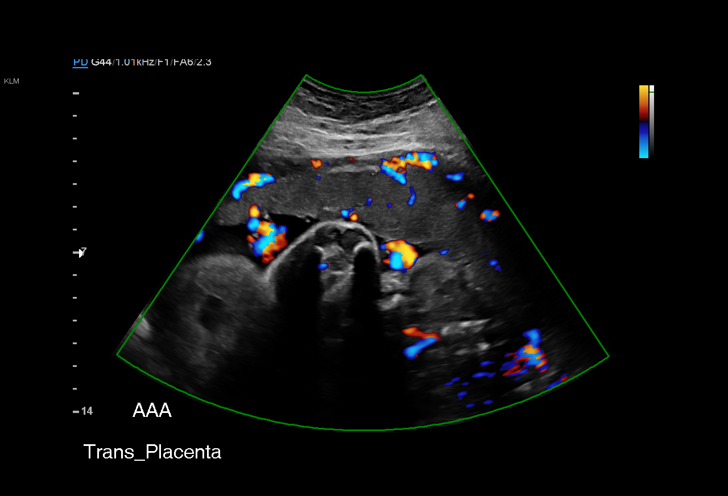
[im 10/25]
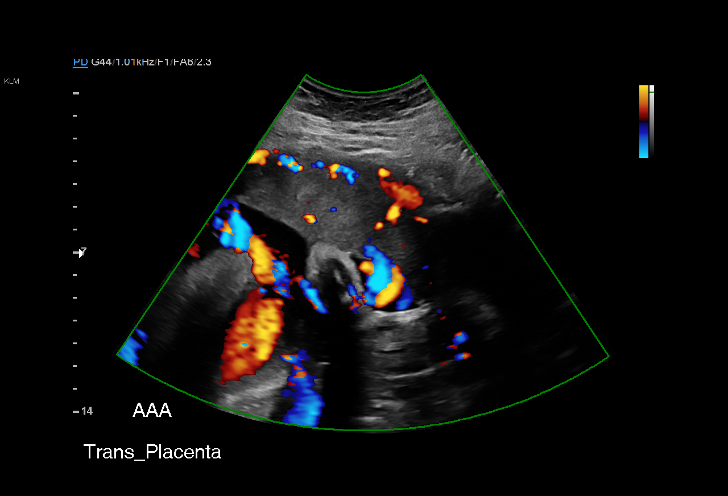
[im 12/25]
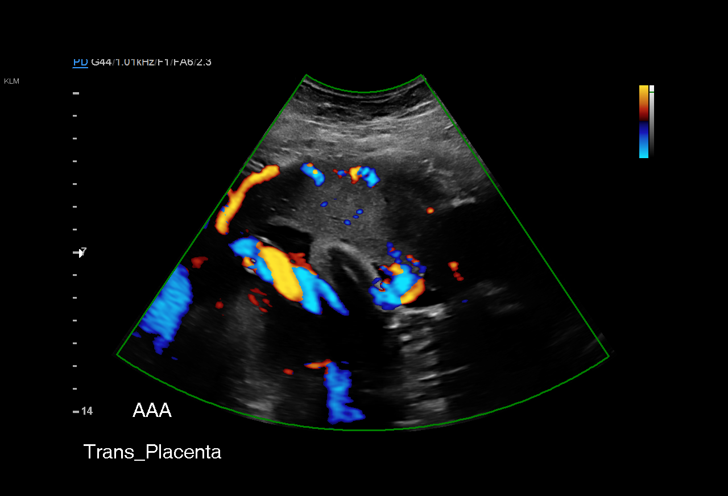
[im 14/25]
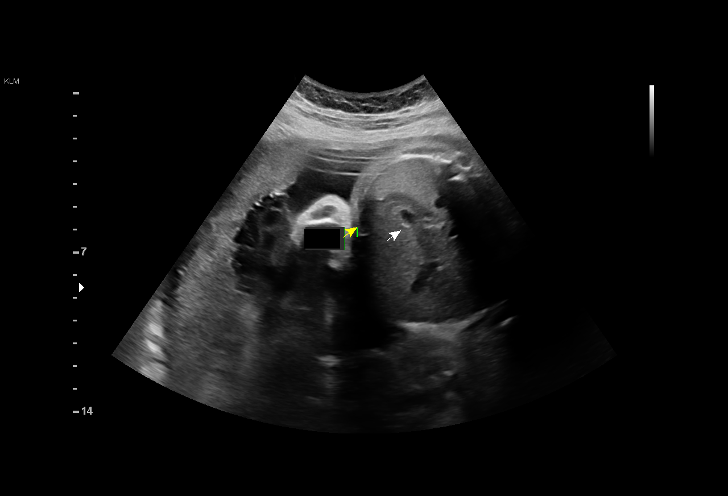
[im 16/25]
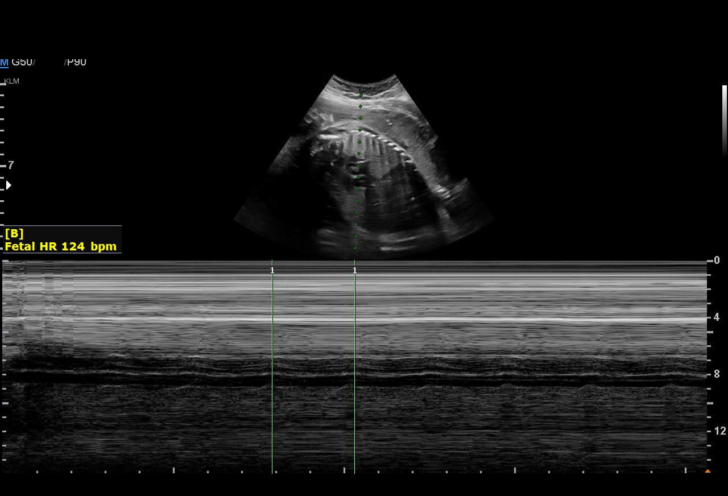
[im 17/25]
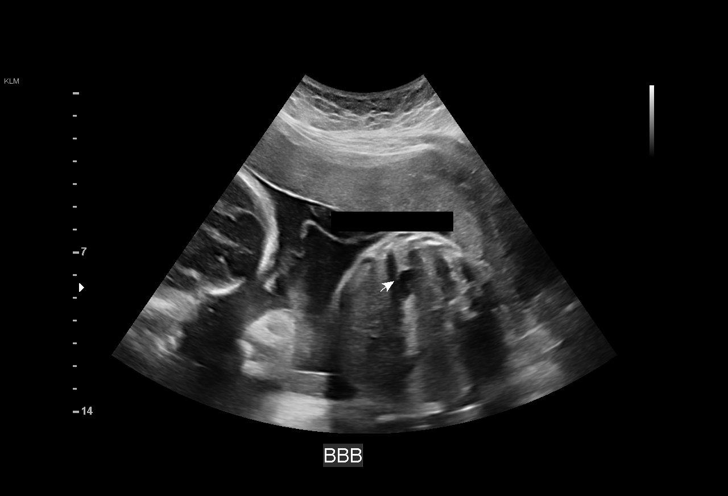
[im 19/25]
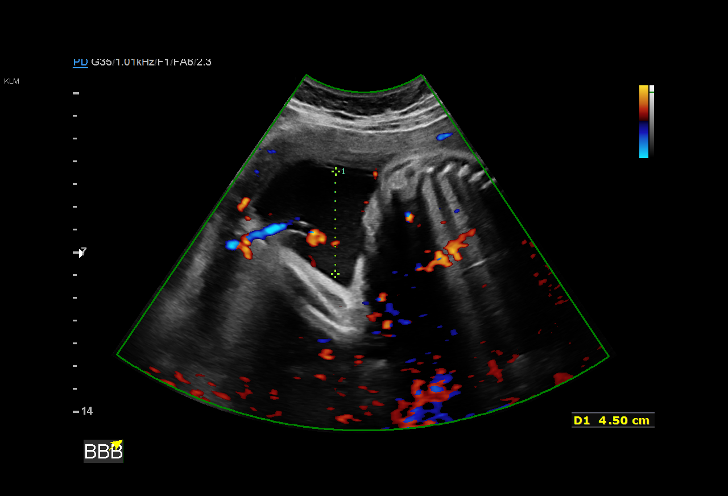
[im 21/25]
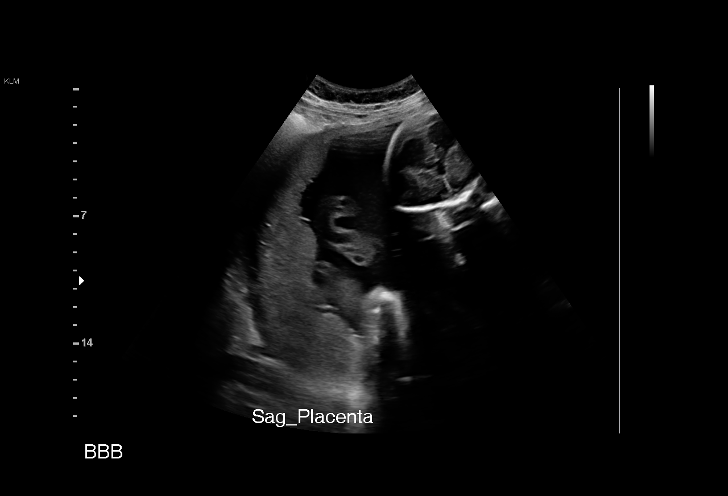
[im 23/25]
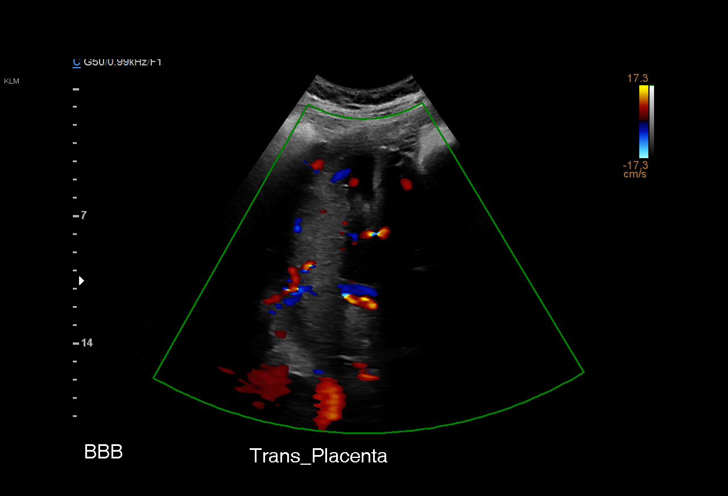
[im 25/25]
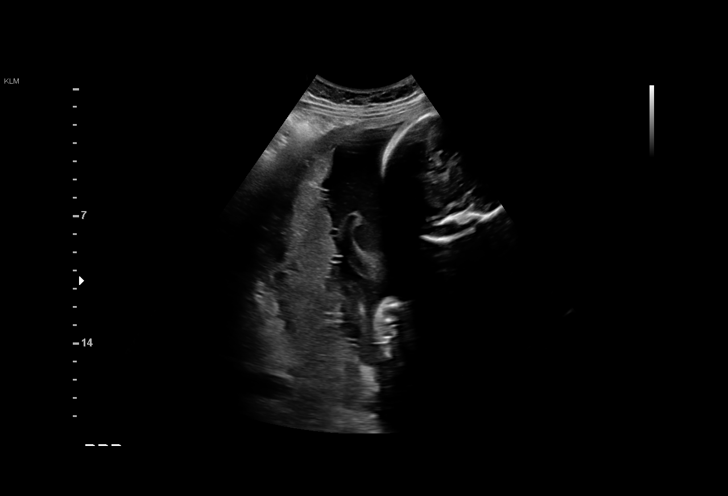

[14 of 25 positions shown; findings below may reference images not displayed]

1  US MFM OB LIMITED                    76815.01     HEIKE LEON
 ----------------------------------------------------------------------

 ----------------------------------------------------------------------
Indications

  Twin pregnancy, di/di, third trimester
  Vaginal bleeding in pregnancy, third trimester
  29 weeks gestation of pregnancy
  Advanced maternal age multigravida 35+,
  third trimester
 ----------------------------------------------------------------------
Vital Signs

                                                Height:        5'0"
Fetal Evaluation (Fetus A)

 Num Of Fetuses:          2
 Fetal Heart Rate(bpm):   137
 Cardiac Activity:        Observed
 Fetal Lie:               Maternal right side
 Presentation:            Breech
 Placenta:                Anterior
 P. Cord Insertion:       Previously Visualized
 Membrane Desc:      Dividing Membrane seen - Dichorionic.

 Amniotic Fluid
 AFI FV:      Within normal limits

                             Largest Pocket(cm)


 Comment:    No placental abruption or previa identified.
OB History

 Gravidity:    3         Term:   1         SAB:   1
 Living:       1
Gestational Age (Fetus A)

 LMP:           29w 4d        Date:  03/25/19                 EDD:   12/30/19
 Best:          29w 4d     Det. By:  LMP  (03/25/19)          EDD:   12/30/19
Anatomy (Fetus A)

 Stomach:               Appears normal, left   Bladder:                Appears normal
                        sided

Fetal Evaluation (Fetus B)

 Num Of Fetuses:          2
 Fetal Heart Rate(bpm):   124
 Cardiac Activity:        Observed
 Fetal Lie:               Maternal left side
 Presentation:            Breech
 Placenta:                Posterior
 P. Cord Insertion:       Previously Visualized
 Membrane Desc:      Dividing Membrane seen - Dichorionic.

 Amniotic Fluid
 AFI FV:      Within normal limits

                             Largest Pocket(cm)


 Comment:    No placental abruption or previa identified.
Gestational Age (Fetus B)

 LMP:           29w 4d        Date:  03/25/19                 EDD:   12/30/19
 Best:          29w 4d     Det. By:  LMP  (03/25/19)          EDD:   12/30/19
Anatomy (Fetus B)

 Stomach:               Appears normal, left   Bladder:                Appears normal
                        sided
Cervix Uterus Adnexa

 Cervix
 Not visualized (advanced GA >02wks)
Comments

 A limited ultrasound performed today shows that twin A and
 twin B are both in the breech/breech presentations.

 There was normal amniotic fluid noted around both fetuses.
